# Patient Record
Sex: Female | Born: 1990 | Race: Black or African American | Hispanic: No | Marital: Single | Smoking: Current some day smoker
Health system: Southern US, Community
[De-identification: ages and names within clinical notes are randomized; demographics above are authoritative.]

## PROBLEM LIST (undated history)

## (undated) DIAGNOSIS — B009 Herpesviral infection, unspecified: Secondary | ICD-10-CM

## (undated) DIAGNOSIS — K219 Gastro-esophageal reflux disease without esophagitis: Secondary | ICD-10-CM

## (undated) DIAGNOSIS — I10 Essential (primary) hypertension: Secondary | ICD-10-CM

## (undated) DIAGNOSIS — Z720 Tobacco use: Principal | ICD-10-CM

## (undated) DIAGNOSIS — F419 Anxiety disorder, unspecified: Secondary | ICD-10-CM

## (undated) DIAGNOSIS — F418 Other specified anxiety disorders: Secondary | ICD-10-CM

## (undated) DIAGNOSIS — E663 Overweight: Secondary | ICD-10-CM

## (undated) DIAGNOSIS — G4700 Insomnia, unspecified: Principal | ICD-10-CM

## (undated) HISTORY — DX: Essential (primary) hypertension: I10

## (undated) HISTORY — DX: Other specified anxiety disorders: F41.8

## (undated) HISTORY — DX: Gastro-esophageal reflux disease without esophagitis: K21.9

## (undated) HISTORY — DX: Anxiety disorder, unspecified: F41.9

## (undated) HISTORY — DX: Insomnia, unspecified: G47.00

## (undated) HISTORY — DX: Herpesviral infection, unspecified: B00.9

## (undated) HISTORY — DX: Overweight: E66.3

## (undated) HISTORY — PX: TONSILLECTOMY AND ADENOIDECTOMY: SHX28

## (undated) HISTORY — DX: Tobacco use: Z72.0

## (undated) HISTORY — PX: WISDOM TOOTH EXTRACTION: SHX21

---
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
## 2011-01-28 ENCOUNTER — Encounter: Payer: Self-pay | Admitting: Internal Medicine

## 2011-01-28 ENCOUNTER — Other Ambulatory Visit (INDEPENDENT_AMBULATORY_CARE_PROVIDER_SITE_OTHER): Payer: 59

## 2011-01-28 ENCOUNTER — Ambulatory Visit (INDEPENDENT_AMBULATORY_CARE_PROVIDER_SITE_OTHER): Payer: 59 | Admitting: Internal Medicine

## 2011-01-28 DIAGNOSIS — R1033 Periumbilical pain: Secondary | ICD-10-CM

## 2011-01-28 DIAGNOSIS — K219 Gastro-esophageal reflux disease without esophagitis: Secondary | ICD-10-CM

## 2011-01-28 LAB — COMPREHENSIVE METABOLIC PANEL
AST: 23 U/L (ref 0–37)
Albumin: 3.9 g/dL (ref 3.5–5.2)
Alkaline Phosphatase: 40 U/L (ref 39–117)
Chloride: 108 mEq/L (ref 96–112)
Glucose, Bld: 92 mg/dL (ref 70–99)
Potassium: 4 mEq/L (ref 3.5–5.1)
Sodium: 141 mEq/L (ref 135–145)
Total Protein: 7.3 g/dL (ref 6.0–8.3)

## 2011-01-28 LAB — CBC WITH DIFFERENTIAL/PLATELET
Eosinophils Absolute: 0.2 10*3/uL (ref 0.0–0.7)
Eosinophils Relative: 2.9 % (ref 0.0–5.0)
Lymphocytes Relative: 32.7 % (ref 12.0–46.0)
MCV: 88.3 fl (ref 78.0–100.0)
Monocytes Absolute: 0.3 10*3/uL (ref 0.1–1.0)
Neutrophils Relative %: 59.2 % (ref 43.0–77.0)
Platelets: 317 10*3/uL (ref 150.0–400.0)
WBC: 6.1 10*3/uL (ref 4.5–10.5)

## 2011-01-28 MED ORDER — OMEPRAZOLE 20 MG PO CPDR: 20.0000 mg | DELAYED_RELEASE_CAPSULE | ORAL | Status: DC

## 2011-01-28 MED ORDER — HYOSCYAMINE SULFATE 0.125 MG SL SUBL: 0.1250 mg | SUBLINGUAL_TABLET | Freq: Two times a day (BID) | SUBLINGUAL | Status: DC | PRN

## 2011-01-28 NOTE — Progress Notes (Signed)
Beverly Pearson March 30, 1991 MRN [id number]    History of Present Illness:  This is a 20 year old, African American female freshman at [employer], with a two-month history of periumbilical abdominal pain associated with nausea but no vomiting which has been occuring almost on a daily basis. She also has some epigastric discomfort with burning in her chest suggestive of reflux. She has not tried any medications. There has been intentional weight loss from 145 to 132 pounds. She denies diarrhea, or rectal bleeding. There is a positive family history of gallbladder disease in her mother who had a cholecystectomy at age 71. There is no family history of inflammatory bowel disease. She started college last fall in [place] but was unhappy and transferred to [employer]. She did not do well academically in the first semester but has done well in summer school which finished last week. She does not sleep well. Her appetite has been decreased. She lives in her own apartment close to the campus. She drinks protein drinks and eats mostly at home. Her bowel habits have changed to constipation.   Past Medical History  Diagnosis Date  . GERD (gastroesophageal reflux disease)   . Constipation    Past Surgical History  Procedure Date  . Tonsillectomy     reports that she has been smoking.  She has never used smokeless tobacco. She reports that she does not drink alcohol or use illicit drugs. family history includes Diabetes in her maternal grandmother and mother.  There is no history of Colon cancer. No Known Allergies      Review of Systems: Denies dysphagia or odynophagia. Occasional heartburn. Positive for change in bowel habits. Negative for rectal bleeding positive for weight loss  The remainder of the 10  point ROS is negative except as outlined in H&P   Physical Exam: General appearance  Well developed, in no distress. Eyes- non icteric. HEENT nontraumatic, normocephalic. Mouth no lesions, tongue  papillated, no cheilosis. Neck supple without adenopathy, thyroid not enlarged, no carotid bruits, no JVD. Lungs Clear to auscultation bilaterally. Cor normal S1 normal S2, regular rhythm , no murmur,  quiet precordium. Abdomen soft nontender abdomen with normal active bowel sounds. No distention. No palpable mass. No CVA tenderness. Rectal: Soft Hemoccult negative stool. Extremities no pedal edema. Skin no lesions. Neurological alert and oriented x 3. Psychological normal mood and affect.  Assessment and Plan:  Problem #1 Periumbilical abdominal pain associated with nausea and gastroesophageal reflux. My strongest suspicion is that this is a functional problem associated with her adjustment. We need to rule out gallbladder disease because of her family history. We also need to rule out hyperacidity and dyspepsia. We will start her on Prilosec 20 mg daily and Levsin sublingual, 0.125 mg twice a day. We will obtain baseline chemistries and recheck her in about 4-6 weeks. If symptoms continue we will consider an upper endoscopy.   01/28/2011 Beverly Pearson

## 2011-01-28 NOTE — Patient Instructions (Signed)
You have been scheduled for an abdominal ultrasound at [facility] (1st floor of hospital) on Wednesday  02/04/11 at 9:00 am. Please arrive 15 minutes prior to your appointment for registration. Make certain not to have anything to eat or drink 6 hours prior to your appointment. Should you need to reschedule your appointment, please contact radiology at [phone]. We have sent the following medications to your pharmacy for you to pick up at your convenience: Prilosec 20 mg. Take 1 tablet by mouth once every morning. Levsin SL. Dissolve 1 tablet under the tongue twice daily as needed. We have sent the following medications to your pharmacy for you to pick up at your convenience: CBC, CMET, TtG, TSH, Sedimentation Rate

## 2011-01-29 ENCOUNTER — Telehealth: Payer: Self-pay | Admitting: *Deleted

## 2011-01-29 NOTE — Telephone Encounter (Signed)
Patient given results as per Dr. Brodie 

## 2011-01-29 NOTE — Telephone Encounter (Signed)
Message copied by Daphine Deutscher on Thu Jan 29, 2011  9:02 AM ------      Message from: Hart Carwin      Created: Wed Jan 28, 2011  6:09 PM       Please call pt with normal blood tests.

## 2011-01-29 NOTE — Telephone Encounter (Signed)
Left a message for patient to call me. 

## 2011-02-02 ENCOUNTER — Telehealth: Payer: Self-pay | Admitting: *Deleted

## 2011-02-02 NOTE — Telephone Encounter (Signed)
Left a message for patient to call me. 

## 2011-02-02 NOTE — Telephone Encounter (Signed)
Message copied by Daphine Deutscher on Mon Feb 02, 2011  3:27 PM ------      Message from: Hart Carwin      Created: Mon Feb 02, 2011  1:35 PM       Please call pt with negative results of Sprue test

## 2011-02-02 NOTE — Telephone Encounter (Signed)
Patient given results

## 2011-02-04 ENCOUNTER — Ambulatory Visit (HOSPITAL_COMMUNITY)
Admission: RE | Admit: 2011-02-04 | Discharge: 2011-02-04 | Disposition: A | Discharge status: Home / Self Care | Payer: 59 | Source: Ambulatory Visit | Attending: Internal Medicine | Admitting: Internal Medicine

## 2011-02-04 DIAGNOSIS — R1033 Periumbilical pain: Secondary | ICD-10-CM

## 2011-02-04 DIAGNOSIS — K219 Gastro-esophageal reflux disease without esophagitis: Secondary | ICD-10-CM

## 2011-02-04 IMAGING — US US ABDOMEN COMPLETE
1 series · 14 of 25 positions shown · non-contrast
Comparison: No comparison studies available.

CLINICAL DATA: Abdominal pain

ABDOMEN ULTRASOUND
TECHNIQUE: Complete abdominal ultrasound examination was performed
including evaluation of the liver, gallbladder, bile ducts,
pancreas, kidneys, spleen, IVC, and abdominal aorta.

[Series 1: us abdomen complete · 0.24mm/px · 14 of 78 slices shown]
[im 1/78]
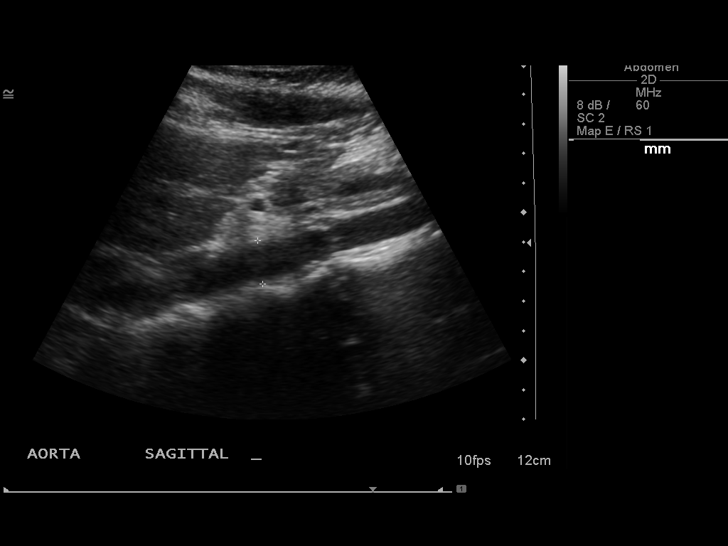
[im 7/78]
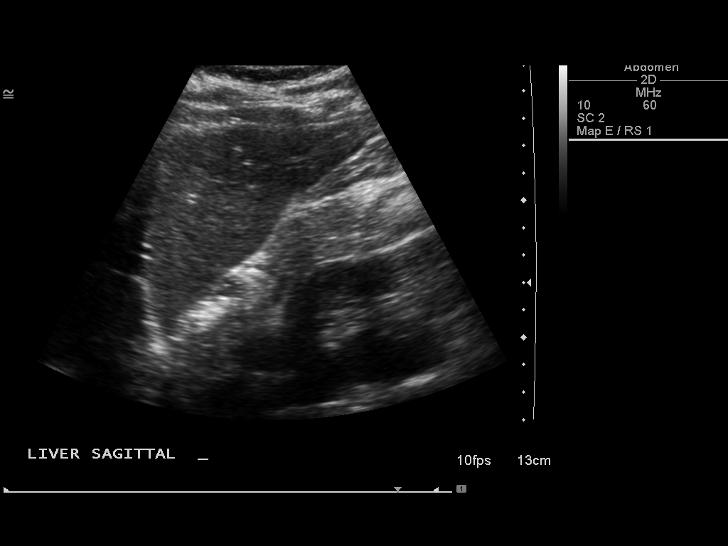
[im 13/78]
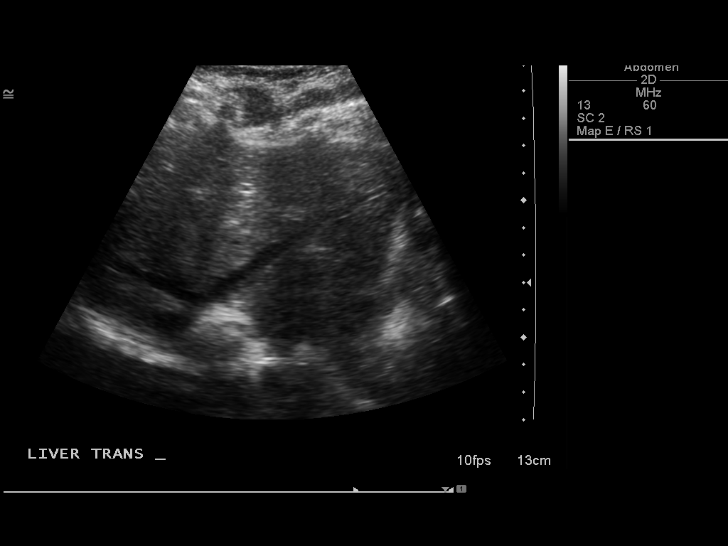
[im 20/78]
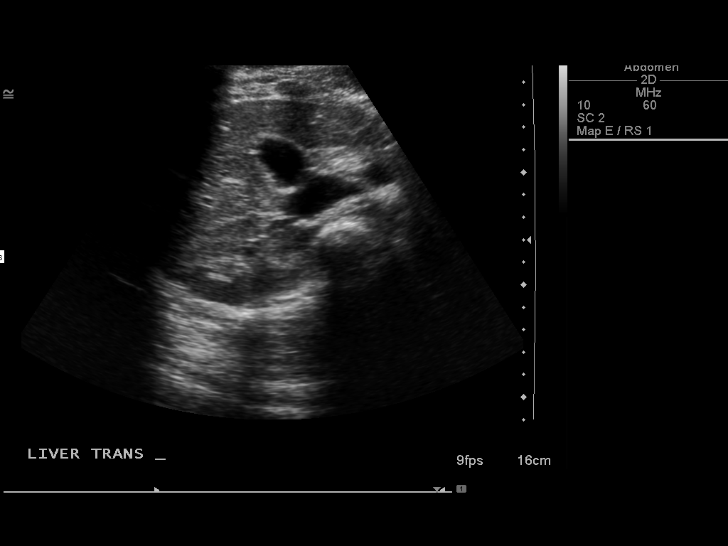
[im 26/78]
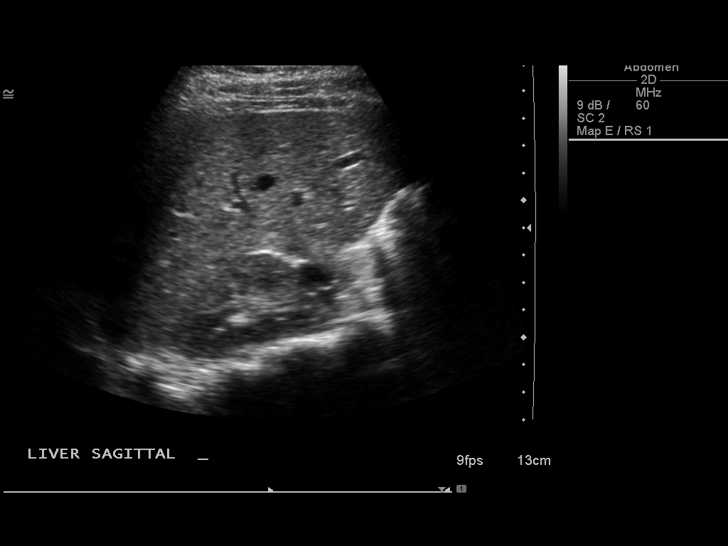
[im 29/78]
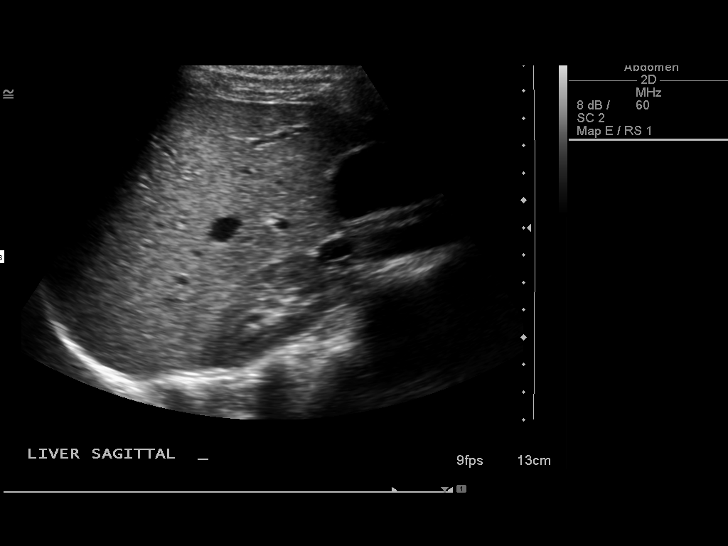
[im 36/78]
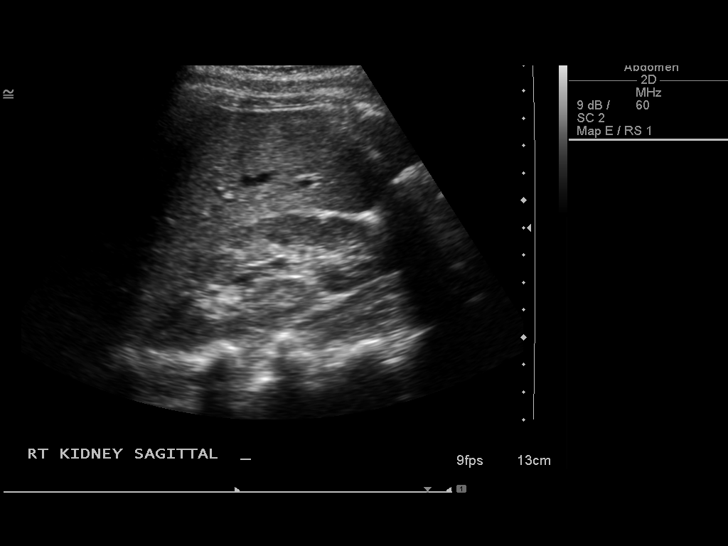
[im 42/78]
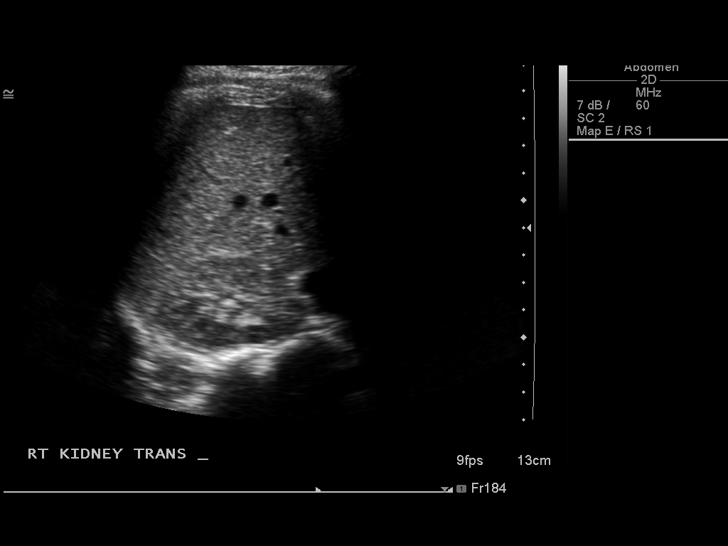
[im 49/78]
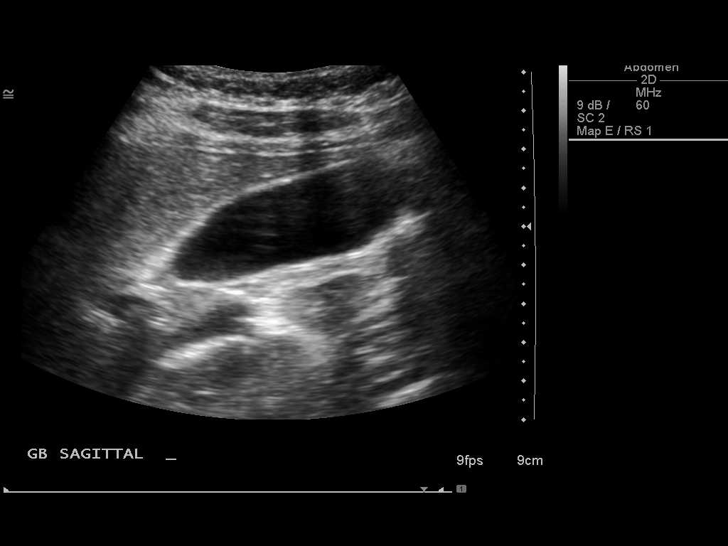
[im 52/78]
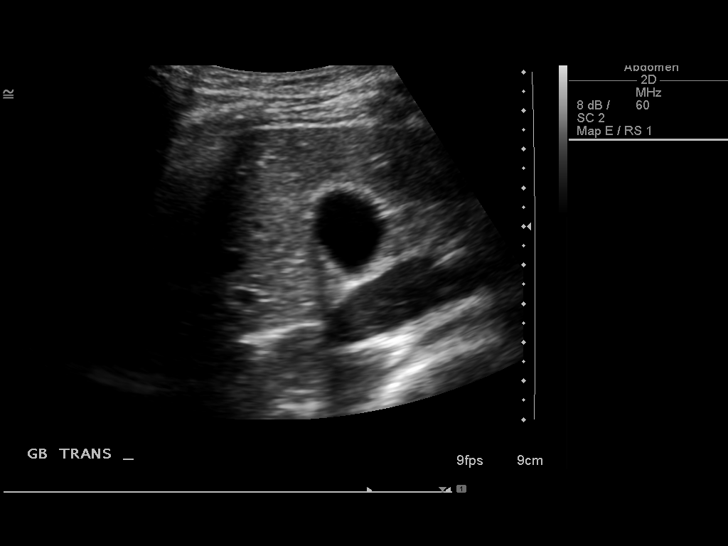
[im 58/78]
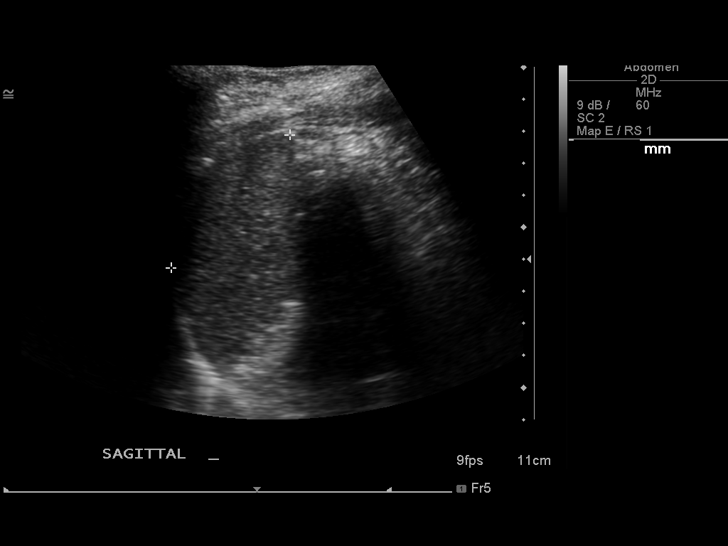
[im 65/78]
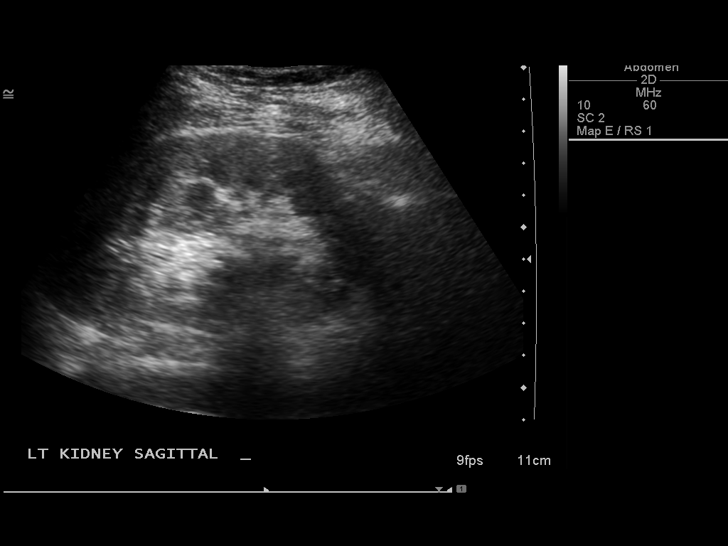
[im 71/78]
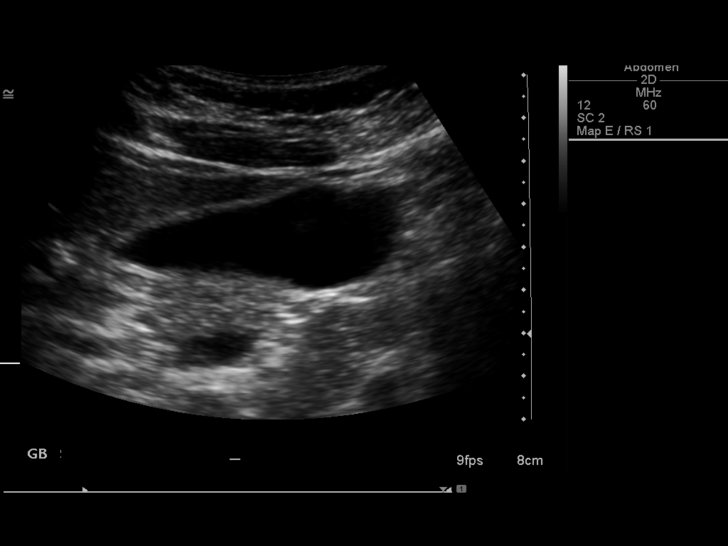
[im 78/78]
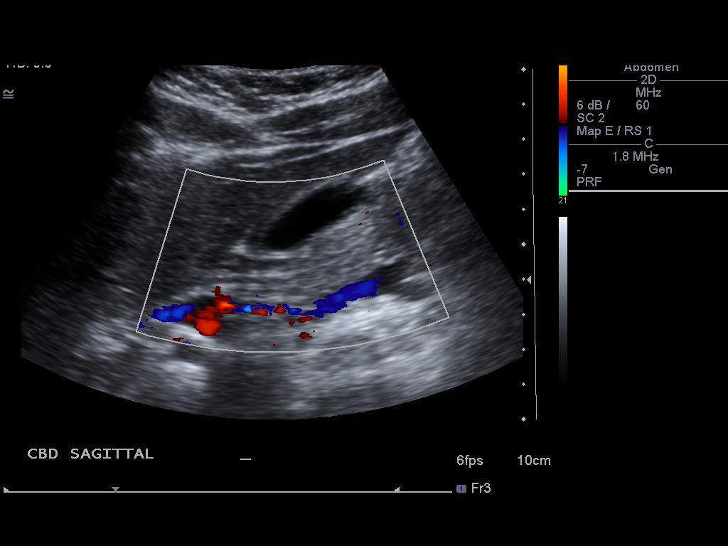

[14 of 25 positions shown; findings below may reference images not displayed]

FINDINGS: Gallbladder:  There is no evidence for gallstones.  No gallbladder
wall thickening or pericholecystic fluid.  The sonographer reports
no sonographic Murphy's sign.

Common Bile Duct:  Nondilated at 2 mm diameter.

Liver:  Normal.

IVC:  Normal.

Pancreas:  Normal.

Spleen:  Normal.

Right kidney:  10.5 cm in long axis.  Normal.

Left kidney:  11.0 cm in long axis.  Normal.

Abdominal Aorta:  No evidence for aneurysm.
IMPRESSION: Normal exam.

## 2011-02-06 ENCOUNTER — Telehealth: Payer: Self-pay | Admitting: *Deleted

## 2011-02-06 NOTE — Telephone Encounter (Signed)
Left a message for patient to call me. 

## 2011-02-06 NOTE — Telephone Encounter (Signed)
Message copied by Daphine Deutscher on Fri Feb 06, 2011  9:20 AM ------      Message from: Hart Carwin      Created: Thu Feb 05, 2011 10:37 PM       Please call pt with normal ultrasound of the gall bladder and liver

## 2011-02-06 NOTE — Telephone Encounter (Signed)
Patient given results as per Dr. Brodie 

## 2011-02-16 ENCOUNTER — Ambulatory Visit (INDEPENDENT_AMBULATORY_CARE_PROVIDER_SITE_OTHER): Payer: 59 | Admitting: Family Medicine

## 2011-02-16 ENCOUNTER — Encounter: Payer: Self-pay | Admitting: Family Medicine

## 2011-02-16 VITALS — BP 118/79 | HR 71 | Temp 98.2°F | Ht 66.0 in | Wt 130.0 lb

## 2011-02-16 DIAGNOSIS — R112 Nausea with vomiting, unspecified: Secondary | ICD-10-CM

## 2011-02-16 DIAGNOSIS — F411 Generalized anxiety disorder: Secondary | ICD-10-CM

## 2011-02-16 DIAGNOSIS — K5900 Constipation, unspecified: Secondary | ICD-10-CM

## 2011-02-16 DIAGNOSIS — F419 Anxiety disorder, unspecified: Secondary | ICD-10-CM

## 2011-02-16 DIAGNOSIS — R109 Unspecified abdominal pain: Secondary | ICD-10-CM

## 2011-02-16 DIAGNOSIS — K219 Gastro-esophageal reflux disease without esophagitis: Secondary | ICD-10-CM

## 2011-02-16 LAB — RENAL FUNCTION PANEL
BUN: 11 mg/dL (ref 6–23)
Chloride: 102 mEq/L (ref 96–112)
GFR: 99.58 mL/min (ref >60.00)
Phosphorus: 3.1 mg/dL (ref 2.3–4.6)
Potassium: 3.5 mEq/L (ref 3.5–5.1)
Sodium: 139 mEq/L (ref 135–145)

## 2011-02-16 LAB — CBC WITH DIFFERENTIAL/PLATELET
Basophils Absolute: 0 10*3/uL (ref 0.0–0.1)
HCT: 45.2 % (ref 36.0–46.0)
Hemoglobin: 15.2 g/dL — ABNORMAL HIGH (ref 12.0–15.0)
Lymphs Abs: 2.3 10*3/uL (ref 0.7–4.0)
Monocytes Relative: 3.6 % (ref 3.0–12.0)
Neutro Abs: 6.2 10*3/uL (ref 1.4–7.7)
RDW: 14.7 % — ABNORMAL HIGH (ref 11.5–14.6)

## 2011-02-16 LAB — POCT URINALYSIS DIPSTICK
Glucose, UA: NEGATIVE
Ketones, UA: >=160
Leukocytes, UA: NEGATIVE

## 2011-02-16 LAB — HEPATIC FUNCTION PANEL
Albumin: 4.6 g/dL (ref 3.5–5.2)
Alkaline Phosphatase: 42 U/L (ref 39–117)
Bilirubin, Direct: 0 mg/dL (ref 0.0–0.3)

## 2011-02-16 LAB — POCT URINE PREGNANCY: Preg Test, Ur: NEGATIVE

## 2011-02-16 MED ORDER — BENEFIBER PO POWD: ORAL | Status: DC

## 2011-02-16 MED ORDER — PROMETHAZINE HCL 25 MG/ML IJ SOLN
12.5000 mg | Freq: Once | INTRAMUSCULAR | Status: AC
  Administered 2011-02-16: 12.5 mg via INTRAMUSCULAR

## 2011-02-16 MED ORDER — ALPRAZOLAM 0.25 MG PO TABS: ORAL_TABLET | ORAL | Status: DC

## 2011-02-16 MED ORDER — ALIGN PO CAPS: 1.0000 | ORAL_CAPSULE | Freq: Every day | ORAL | Status: DC

## 2011-02-16 MED ORDER — PROMETHAZINE HCL 12.5 MG PO TABS: 12.5000 mg | ORAL_TABLET | Freq: Two times a day (BID) | ORAL | Status: AC | PRN

## 2011-02-16 MED ORDER — RANITIDINE HCL 300 MG PO TABS: 300.0000 mg | ORAL_TABLET | Freq: Every day | ORAL | Status: DC | PRN

## 2011-02-16 NOTE — Patient Instructions (Signed)
Constipation in Adults Constipation is having fewer than 2 bowel movements per week. Usually, the stools are hard. As we grow older, constipation is more common. If you try to fix constipation with laxatives, the problem may get worse. This is because laxatives taken over a Fussell period of time make the colon muscles weaker. A low-fiber diet, not taking in enough fluids, and taking some medicines may make these problems worse. MEDICATIONS THAT MAY CAUSE CONSTIPATION  Water pills (diuretics).  Calcium channel blockers (used to control blood pressure and for the heart).   Certain pain medicines (narcotics).   Anticholinergics.  Anti-inflammatory agents.   Antacids that contain aluminum.   DISEASES THAT CONTRIBUTE TO CONSTIPATION  Diabetes.  Parkinson's disease.   Dementia.   Stroke.  Depression.   Illnesses that cause problems with salt and water metabolism.   HOME CARE INSTRUCTIONS  Constipation is usually best cared for without medicines. Increasing dietary fiber and eating more fruits and vegetables is the best way to manage constipation.   Slowly increase fiber intake to 25 to 38 grams per day. Whole grains, fruits, vegetables, and legumes are good sources of fiber. A dietitian can further help you incorporate high-fiber foods into your diet.   Drink enough water and fluids to keep your urine clear or pale yellow.   A fiber supplement may be added to your diet if you cannot get enough fiber from foods.   Increasing your activities also helps improve regularity.   Suppositories, as suggested by your caregiver, will also help. If you are using antacids, such as aluminum or calcium containing products, it will be helpful to switch to products containing magnesium if your caregiver says it is okay.   If you have been given a liquid injection (enema) today, this is only a temporary measure. It should not be relied on for treatment of longstanding (chronic) constipation.    Stronger measures, such as magnesium sulfate, should be avoided if possible. This may cause uncontrollable diarrhea. Using magnesium sulfate may not allow you time to make it to the bathroom.  SEEK IMMEDIATE MEDICAL CARE IF:  There is bright red blood in the stool.   The constipation stays for more than 4 days.   There is belly (abdominal) or rectal pain.   You do not seem to be getting better.   You have any questions or concerns.  MAKE SURE YOU:  Understand these instructions.   Will watch your condition.   Will get help right away if you are not doing well or get worse.  Document Released: 03/06/2004 Document Re-Released: 09/02/2009 [facility] Patient Information 2011 [place], [place].    Start a probiotic such as Librarian, academic, or a generic alternative is acceptable  Remember 64 oz of clear fluids daily Try Mylanta as needed for heartburn Maintain The BRAT diet Bananas, rice, applesauce, toast, etc  Consider a yogurt daily, ie [employer]

## 2011-02-17 ENCOUNTER — Encounter: Payer: Self-pay | Admitting: Family Medicine

## 2011-02-17 ENCOUNTER — Telehealth: Payer: Self-pay | Admitting: Family Medicine

## 2011-02-17 DIAGNOSIS — K5900 Constipation, unspecified: Secondary | ICD-10-CM | POA: Insufficient documentation

## 2011-02-17 DIAGNOSIS — IMO0001 Reserved for inherently not codable concepts without codable children: Secondary | ICD-10-CM

## 2011-02-17 DIAGNOSIS — F419 Anxiety disorder, unspecified: Secondary | ICD-10-CM

## 2011-02-17 HISTORY — DX: Reserved for inherently not codable concepts without codable children: IMO0001

## 2011-02-17 HISTORY — DX: Anxiety disorder, unspecified: F41.9

## 2011-02-17 LAB — H. PYLORI ANTIBODY, IGG: H Pylori IgG: NEGATIVE

## 2011-02-17 NOTE — Assessment & Plan Note (Signed)
Patient was seen by gastroenterology earlier this month with persistent dyspepsia, nausea and vomiting. He was started on omeprazole and hyoscyamine. Has not found any relief from hyoscyamine.. Omeprazole has been marginally helpful but she has not taken the last day or 2 in preparation for her visit. She agrees to eat a bland diet and start Ranitdine. H Pylori is negative and her recent abdominal US was reviewed with her and was unremarkable. May reserve Hyoscyamine for sharp abdominal pains which generally not her concern.

## 2011-02-17 NOTE — Telephone Encounter (Signed)
Sent the results already all labs essentially normal see previous note

## 2011-02-17 NOTE — Assessment & Plan Note (Signed)
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 Allergies she is having trouble sleeping very anxious and having palpitations, diaphoresis and her stomach is worse when stress elevates. She is accompanied by her mother who agrees that she is very anxious. She is given a small amount of Alprazolam to use prn if she has a severely anxious moment to assess her response. She is asked to consider an SSRI in the future if symptoms persist.

## 2011-02-17 NOTE — Progress Notes (Signed)
Beverly Pearson [id number] 1990-12-27 02/17/2011      Progress Note New Patient  Subjective  Chief Complaint  Chief Complaint  Patient presents with  . Establish Care    New patient/ stomach issues    HPI  Patient is a 20 year old African [employer] sophomore who is in today for initial new patient appointment accompanied by her mother. She is struggling with for a couple month history of persistent reflux heartburn, dyspepsia. She has persistent nausea for about the last 2-3 months and experiences vomiting intermittently after eating. She denies any particular foods cause this. She denies any fevers, chills any bloody or tarry stool. She acknowledges trouble with constipation. She has 1 BM every 2-3 days and often has to strain in the bowel movements are hard and soft. This will alternate with more liquidy explosive stool. She has been attempting weight loss over the summer and had been taking Herbalife products which she has recently stopped. Today she started the Neurontin medication for anxiety but has not seen a difference yet. She did switch him to bring him seizing and the generic Seasonique sometime around the beginning of this. She does note some mild vaginal discharge but no lesions or sores. No fevers, chills, dysuria, hematuria, urinary frequency. No congestion no recent illness, fevers no chills. No palpitations, SOB. She acknowledges she is under a great deal of stress lately. Is working hard in school and is having anxiety attacks with palpitations, diaphoresis, tremulousness and a sense of SOB.   Past Medical History  Diagnosis Date  . GERD (gastroesophageal reflux disease)   . Constipation   . Reflux 02/17/2011  . Anxiety 02/17/2011    Past Surgical History  Procedure Date  . Tonsillectomy and adenoidectomy     age 23    Family History  Problem Relation Age of Onset  . Colon cancer Neg Hx   . Diabetes Mother     type 2  . Hypertension Mother   . Diabetes Maternal  Grandmother     type 2  . Cancer Maternal Grandmother 70    ovarian  . Hypertension Maternal Grandmother   . Kidney failure Maternal Grandfather   . Hypertension Maternal Grandfather   . Kidney disease Maternal Grandfather   . Diabetes Paternal Grandmother     type 2  . Hypertension Paternal Grandmother   . Emphysema Paternal Grandfather     smoke  . COPD Paternal Grandfather   . Cancer Paternal Grandfather 42    prostate cancer    History   Social History  . Marital Status: Single    Spouse Name: N/A    Number of Children: 0  . Years of Education: N/A   Occupational History  . Unemployed    Social History Main Topics  . Smoking status: Current Everyday Smoker    Types: Cigarettes  . Smokeless tobacco: Never Used   Comment: smokes 1 or 2 black and milds daily  . Alcohol Use: No  . Drug Use: No  . Sexually Active: No   Other Topics Concern  . Not on file   Social History Narrative   Occasional soda     Current Outpatient Prescriptions on File Prior to Visit  Medication Sig Dispense Refill  . Levonorgestrel-Ethinyl Estradiol (SEASONIQUE) 0.15-0.03 &0.01 MG tablet Take 1 tablet by mouth daily.        Marland Kitchen omeprazole (PRILOSEC) 20 MG capsule Take 1 capsule (20 mg total) by mouth every morning.  30 capsule  3  . Docusate  Calcium (STOOL SOFTENER PO) Take by mouth as needed.          No Known Allergies  Review of Systems Review of Systems  Constitutional: Positive for weight loss. Negative for fever, chills and malaise/fatigue.       Intentional, denies any bulimic or anorexic behavior, Mother agrees.  HENT: Negative for congestion and sore throat.   Eyes: Negative for discharge.  Respiratory: Negative for shortness of breath and wheezing.   Cardiovascular: Negative for chest pain, palpitations and leg swelling.  Gastrointestinal: Positive for heartburn, nausea, vomiting, diarrhea and constipation. Negative for abdominal pain, blood in stool and melena.    Genitourinary: Negative for dysuria.  Musculoskeletal: Negative for falls.  Skin: Negative for rash.  Neurological: Negative for loss of consciousness and headaches.  Endo/Heme/Allergies: Negative for polydipsia.  Psychiatric/Behavioral: Negative for depression, suicidal ideas and substance abuse. The patient is nervous/anxious. The patient does not have insomnia.     Objective  BP 118/79  Pulse 71  Temp(Src) 98.2 F (36.8 C) (Oral)  Ht 5\' 6"  (1.676 m)  Wt 130 lb (58.968 kg)  BMI 20.98 kg/m2  SpO2 96%  Physical Exam  Physical Exam  Constitutional: She is oriented to person, place, and time and well-developed, well-nourished, and in no distress. No distress.  HENT:  Head: Normocephalic and atraumatic.  Right Ear: External ear normal.  Left Ear: External ear normal.  Nose: Nose normal.  Mouth/Throat: Oropharynx is clear and moist. No oropharyngeal exudate.  Eyes: Conjunctivae are normal. Pupils are equal, round, and reactive to light. Right eye exhibits no discharge. Left eye exhibits no discharge. No scleral icterus.  Neck: Normal range of motion. Neck supple. No thyromegaly present.  Cardiovascular: Normal rate, regular rhythm, normal heart sounds and intact distal pulses.   No murmur heard. Pulmonary/Chest: Effort normal and breath sounds normal. No respiratory distress. She has no wheezes. She has no rales.  Abdominal: Soft. Bowel sounds are normal. She exhibits no distension and no mass. There is no tenderness.  Musculoskeletal: Normal range of motion. She exhibits no edema and no tenderness.  Lymphadenopathy:    She has no cervical adenopathy.  Neurological: She is alert and oriented to person, place, and time. She has normal reflexes. No cranial nerve deficit. Coordination normal.  Skin: Skin is warm and dry. No rash noted. She is not diaphoretic.  Psychiatric: Mood, memory and affect normal.       Assessment & Plan  Reflux Patient was seen by gastroenterology  earlier this month with persistent dyspepsia, nausea and vomiting. He was started on omeprazole and hyoscyamine. Has not found any relief from hyoscyamine.. Omeprazole has been marginally helpful but she has not taken the last day or 2 in preparation for her visit. She agrees to eat a bland diet and start Ranitdine. H Pylori is negative and her recent abdominal US was reviewed with her and was unremarkable. May reserve Hyoscyamine for sharp abdominal pains which generally not her concern.   Constipation Contributing to her dyspepsia. She is having hot difficult and has been only every 2-3 days. She often has to strain and has a loose bowel movement. This will actually was loose and liquidy explosive diarrhea. She is encouraged to increase the soluble fiber in her diet increase her hydration increase her exercise and had a fiber supplement such as FiberCon Metamucil daily. Chills and needs to start a program and if this is helpful we will add MiraLax as well.  Anxiety Patient is a sophomore at  [employer] and is under a great deal of stress. Allergies she is having trouble sleeping very anxious and having palpitations, diaphoresis and her stomach is worse when stress elevates. She is accompanied by her mother who agrees that she is very anxious. She is given a small amount of Alprazolam to use prn if she has a severely anxious moment to assess her response. She is asked to consider an SSRI in the future if symptoms persist.     f

## 2011-02-17 NOTE — Telephone Encounter (Signed)
Patient is checking to see if test results are in

## 2011-02-17 NOTE — Assessment & Plan Note (Signed)
Contributing to her dyspepsia. She is having hot difficult and has been only every 2-3 days. She often has to strain and has a loose bowel movement. This will actually was loose and liquidy explosive diarrhea. She is encouraged to increase the soluble fiber in her diet increase her hydration increase her exercise and had a fiber supplement such as FiberCon Metamucil daily. Chills and needs to start a program and if this is helpful we will add MiraLax as well.

## 2011-02-18 NOTE — Telephone Encounter (Signed)
Pt notified in results note. 

## 2011-02-26 ENCOUNTER — Ambulatory Visit (INDEPENDENT_AMBULATORY_CARE_PROVIDER_SITE_OTHER): Payer: 59 | Admitting: Licensed Clinical Social Worker

## 2011-02-26 DIAGNOSIS — F331 Major depressive disorder, recurrent, moderate: Secondary | ICD-10-CM

## 2011-03-02 ENCOUNTER — Emergency Department (HOSPITAL_COMMUNITY)
Admission: EM | Admit: 2011-03-02 | Discharge: 2011-03-03 | Disposition: A | Discharge status: Home / Self Care | Payer: 59 | Attending: Emergency Medicine | Admitting: Emergency Medicine

## 2011-03-02 DIAGNOSIS — R000 Tachycardia, unspecified: Secondary | ICD-10-CM | POA: Insufficient documentation

## 2011-03-02 DIAGNOSIS — F329 Major depressive disorder, single episode, unspecified: Secondary | ICD-10-CM | POA: Insufficient documentation

## 2011-03-02 DIAGNOSIS — F3289 Other specified depressive episodes: Secondary | ICD-10-CM | POA: Insufficient documentation

## 2011-03-02 DIAGNOSIS — F1910 Other psychoactive substance abuse, uncomplicated: Secondary | ICD-10-CM | POA: Insufficient documentation

## 2011-03-02 LAB — DIFFERENTIAL
Eosinophils Absolute: 0.1 10*3/uL (ref 0.0–0.7)
Eosinophils Relative: 1 % (ref 0–5)
Lymphs Abs: 2.6 10*3/uL (ref 0.7–4.0)
Monocytes Absolute: 0.5 10*3/uL (ref 0.1–1.0)
Monocytes Relative: 4 % (ref 3–12)

## 2011-03-02 LAB — RAPID URINE DRUG SCREEN, HOSP PERFORMED
Amphetamines: NOT DETECTED
Cocaine: NOT DETECTED
Opiates: NOT DETECTED
Tetrahydrocannabinol: POSITIVE — AB

## 2011-03-02 LAB — CBC
MCH: 29 pg (ref 26.0–34.0)
MCHC: 33.7 g/dL (ref 30.0–36.0)
MCV: 86 fL (ref 78.0–100.0)
Platelets: 367 10*3/uL (ref 150–400)
RDW: 14.2 % (ref 11.5–15.5)
WBC: 11.4 10*3/uL — ABNORMAL HIGH (ref 4.0–10.5)

## 2011-03-02 LAB — COMPREHENSIVE METABOLIC PANEL
AST: 32 U/L (ref 0–37)
Albumin: 3.9 g/dL (ref 3.5–5.2)
Calcium: 9.4 mg/dL (ref 8.4–10.5)
Creatinine, Ser: 0.68 mg/dL (ref 0.50–1.10)
Total Protein: 7.7 g/dL (ref 6.0–8.3)

## 2011-03-02 LAB — ETHANOL: Alcohol, Ethyl (B): <11 mg/dL (ref 0–11)

## 2011-03-03 ENCOUNTER — Encounter: Payer: Self-pay | Admitting: Family Medicine

## 2011-03-03 ENCOUNTER — Ambulatory Visit (INDEPENDENT_AMBULATORY_CARE_PROVIDER_SITE_OTHER): Payer: 59 | Admitting: Family Medicine

## 2011-03-03 VITALS — BP 118/79 | HR 103 | Temp 98.3°F | Ht 66.0 in | Wt 129.0 lb

## 2011-03-03 DIAGNOSIS — F419 Anxiety disorder, unspecified: Secondary | ICD-10-CM

## 2011-03-03 DIAGNOSIS — K219 Gastro-esophageal reflux disease without esophagitis: Secondary | ICD-10-CM

## 2011-03-03 DIAGNOSIS — M791 Myalgia, unspecified site: Secondary | ICD-10-CM

## 2011-03-03 DIAGNOSIS — K5900 Constipation, unspecified: Secondary | ICD-10-CM

## 2011-03-03 DIAGNOSIS — F341 Dysthymic disorder: Secondary | ICD-10-CM

## 2011-03-03 DIAGNOSIS — F418 Other specified anxiety disorders: Secondary | ICD-10-CM

## 2011-03-03 HISTORY — DX: Other specified anxiety disorders: F41.8

## 2011-03-03 MED ORDER — ALPRAZOLAM 0.25 MG PO TABS: ORAL_TABLET | ORAL | Status: DC

## 2011-03-03 MED ORDER — CARISOPRODOL 350 MG PO TABS: 350.0000 mg | ORAL_TABLET | Freq: Every evening | ORAL | Status: DC | PRN

## 2011-03-03 NOTE — Patient Instructions (Signed)
Depression You have signs of depression. This is a common problem. It can occur at any age. It is often hard to recognize. People can suffer from depression and still have moments of enjoyment. Depression interferes with your basic ability to function in life. It upsets your relationships, sleep, eating, and work habits. CAUSES Depression is believed to be caused by an imbalance in brain chemicals. It may be triggered by an unpleasant event. Relationship crises, a death in the family, financial worries, retirement, or other stressors are normal causes of depression. Depression may also start for no known reason. Other factors that may play a part include medical illnesses, some medicines, genetics, and alcohol or drug abuse. SYMPTOMS  Feeling unhappy or worthless.   Rhines-lasting (chronic) tiredness or worn-out feeling.   Self-destructive thoughts and actions.   Not being able to sleep or sleeping too much.   Eating more than usual or not eating at all.   Headaches or feeling anxious.   Trouble concentrating or making decisions.   Unexplained physical problems and substance abuse.  TREATMENT Depression usually gets better with treatment. This can include:  Antidepressant medicines. It can take weeks before the proper dose is achieved and benefits are reached.   Talking with a therapist, clergyperson, counselor, or friend. These people can help you gain insight into your problem and regain control of your life.   Eating a good diet.   Getting regular physical exercise, such as walking for 30 minutes every day.   Not abusing alcohol or drugs.  Treating depression often takes 6 months or longer. This length of treatment is needed to keep symptoms from returning. Call your caregiver and arrange for follow-up care as suggested. SEEK IMMEDIATE MEDICAL CARE IF:  You start to have thoughts of hurting yourself or others.   Call your local emergency services (911 in [place]   Go to your  local medical emergency department.   Call the National Suicide Prevention Lifeline: 1-800-273-TALK (1-800-273-8255).  Document Released: 06/08/2005 Document Re-Released: 11/26/2009 [employer] Patient Information 2011 [employer]. 

## 2011-03-03 NOTE — Assessment & Plan Note (Addendum)
She was prescribed ranitidine her last visit but did not start it. She agrees to try starting this week to see if it helps with her upper quadrant abdominal discomfort and her early satiety. Hyoscyamine has been helping with her sharper abdominal pain and her Promethazine helps with nausea. She is encouraged to maintain a bland diet and eat small, frequent meals

## 2011-03-03 NOTE — Progress Notes (Signed)
Beverly Pearson [id number] [birth year] 03/03/2011      Progress Note-Follow Up  Subjective  Chief Complaint  Chief Complaint  Patient presents with  . Follow-up    3 week follow up    HPI  This is a 20 year old female in today accompanied by her mother. She is very tearful and acknowledges severe depression. Presented to [facility] yesterday feeling very depressed and while she did not have a suicidal plan she does say she's had fleeting thoughts of suicide. She reports she is not a risk for self at present and has appointment at 2:00 today with psychiatry. Her mom is with her and agrees. She continues to struggle with her mood. She is now admitting to smoking marijuana and has been trying to stop. Did not sleep last night without smoking marijuana and is worried about sleeping tonight. Used to Xanax a few times since her last visit and does report it helped her stop crying when she was panicked but otherwise has not been helpful. She did start fiber and probiotics and her constipation is better but she continues to have intermittent abdominal pain usually in the upper quadrants. She has persistent nausea and anorexia as well as early satiety. No fevers, chills, chest pain, palpitations at present she does acknowledge chest tightness and shortness of breath occurs and she is crying and feeling panicked but not at other times.  Past Medical History  Diagnosis Date  . GERD (gastroesophageal reflux disease)   . Constipation   . Reflux 02/17/2011  . Anxiety 02/17/2011  . Depression with anxiety 03/03/2011  . Reflux 03/03/2011    Past Surgical History  Procedure Date  . Tonsillectomy and adenoidectomy     age 14    Family History  Problem Relation Age of Onset  . Colon cancer Neg Hx   . Diabetes Mother     type 2  . Hypertension Mother   . Diabetes Maternal Grandmother     type 2  . Cancer Maternal Grandmother 70    ovarian  . Hypertension Maternal Grandmother   . Kidney failure  Maternal Grandfather   . Hypertension Maternal Grandfather   . Kidney disease Maternal Grandfather   . Diabetes Paternal Grandmother     type 2  . Hypertension Paternal Grandmother   . Emphysema Paternal Grandfather     smoke  . COPD Paternal Grandfather   . Cancer Paternal Grandfather 14    prostate cancer    History   Social History  . Marital Status: Single    Spouse Name: N/A    Number of Children: 0  . Years of Education: N/A   Occupational History  . Unemployed    Social History Main Topics  . Smoking status: Former Smoker    Types: Cigarettes    Quit date: 02/16/2011  . Smokeless tobacco: Never Used   Comment: smokes 1 or 2 black and milds daily  . Alcohol Use: No  . Drug Use: Yes     marijuana-2 G or more daily- stopped 03-02-11  . Sexually Active: No   Other Topics Concern  . Not on file   Social History Narrative   Occasional soda     Current Outpatient Prescriptions on File Prior to Visit  Medication Sig Dispense Refill  . bifidobacterium infantis (ALIGN) capsule Take 1 capsule by mouth daily.  30 capsule  1  . Docusate Calcium (STOOL SOFTENER PO) Take by mouth as needed.        . hyoscyamine (  ANASPAZ) 0.125 MG TBDP Place 0.125 mg under the tongue 2 (two) times daily as needed.        . Levonorgestrel-Ethinyl Estradiol (SEASONIQUE) 0.15-0.03 &0.01 MG tablet Take 1 tablet by mouth daily.        . Multiple Vitamin (MULTIVITAMIN) tablet Take 1 tablet by mouth daily as needed.        Marland Kitchen omeprazole (PRILOSEC) 20 MG capsule Take 1 capsule (20 mg total) by mouth every morning.  30 capsule  3  . ranitidine (ZANTAC) 300 MG tablet Take 1 tablet (300 mg total) by mouth daily as needed for heartburn.  30 tablet  2  . Wheat Dextrin (BENEFIBER) POWD 2 tsp by mouth twice daily in 6-8 oz of liquid  730 g  0    No Known Allergies  Review of Systems  Review of Systems  Constitutional: Negative for fever and malaise/fatigue.  HENT: Negative for congestion.   Eyes:  Negative for discharge.  Respiratory: Negative for cough and shortness of breath.   Cardiovascular: Negative for chest pain, palpitations and leg swelling.  Gastrointestinal: Positive for nausea and abdominal pain. Negative for diarrhea, constipation, blood in stool and melena.  Genitourinary: Negative for dysuria.  Musculoskeletal: Negative for falls.  Skin: Negative for rash.  Neurological: Negative for loss of consciousness and headaches.  Endo/Heme/Allergies: Negative for polydipsia.  Psychiatric/Behavioral: Positive for depression, suicidal ideas and substance abuse. Negative for hallucinations. The patient is nervous/anxious and has insomnia.     Objective  BP 118/79  Pulse 103  Temp(Src) 98.3 F (36.8 C) (Oral)  Ht 5\' 6"  (1.676 m)  Wt 129 lb (58.514 kg)  BMI 20.82 kg/m2  SpO2 100%  Physical Exam  Physical Exam  Constitutional: She is oriented to person, place, and time and well-developed, well-nourished, and in no distress. No distress.  HENT:  Head: Normocephalic and atraumatic.  Eyes: Conjunctivae are normal.  Neck: Neck supple. No thyromegaly present.  Cardiovascular: Normal rate, regular rhythm and normal heart sounds.   No murmur heard. Pulmonary/Chest: Effort normal and breath sounds normal. She has no wheezes.  Abdominal: She exhibits no distension and no mass.  Musculoskeletal: She exhibits no edema.  Lymphadenopathy:    She has no cervical adenopathy.  Neurological: She is alert and oriented to person, place, and time.  Skin: Skin is warm and dry. No rash noted. She is not diaphoretic.  Psychiatric: Memory, affect and judgment normal.    Lab Results  Component Value Date   TSH 1.70 01/28/2011   Lab Results  Component Value Date   WBC 11.4* 03/02/2011   HGB 14.5 03/02/2011   HCT 43.0 03/02/2011   MCV 86.0 03/02/2011   PLT 367 03/02/2011   Lab Results  Component Value Date   CREATININE 0.68 03/02/2011   BUN 6 03/02/2011   NA 136 03/02/2011   K 3.5  03/02/2011   CL 100 03/02/2011   CO2 24 03/02/2011   Lab Results  Component Value Date   ALT 70* 03/02/2011   AST 32 03/02/2011   ALKPHOS 59 03/02/2011   BILITOT 0.3 03/02/2011      Assessment & Plan  Depression with anxiety Patient is accompanied by her mother acknowledging the depression is increasingly alert her issues and anxiety. She has used her very small amount of Xanax (at her last visit when she was crying incessantly and it did help. She did see about behavioral health as recommended and was referred on to psychiatry unfortunately the psychiatry she was referred  to was not taking new patients. Yesterday she presented to [facility] and has been set up for her own appointment with psychiatry today. She also notices at this visit and marijuana use which she did not admit to at her last visit. She has chosen to stop smoking all marijuana and is worried about how she will call herself to sleep without it. She is allowed Xanax 0.25 mg 1-2 tabs each bedtime to see if that helps and we will await psychiatry's important before making any further changes. We will see her next week in followup or sooner as needed  Constipation Improved with fiber supplement and probiotics. She now moves her bowels daily without significant difficulty.  Reflux She was prescribed ranitidine her last visit but did not start it. She agrees to try starting this week to see if it helps with her upper quadrant abdominal discomfort and her early satiety. Hyoscyamine has been helping with her sharper abdominal pain and her Promethazine helps with nausea. She is encouraged to maintain a bland diet and eat small, frequent meals

## 2011-03-03 NOTE — Assessment & Plan Note (Signed)
Patient is accompanied by her mother acknowledging the depression is increasingly alert her issues and anxiety. She has used her very small amount of Xanax (at her last visit when she was crying incessantly and it did help. She did see about behavioral health as recommended and was referred on to psychiatry unfortunately the psychiatry she was referred to was not taking new patients. Yesterday she presented to [facility] and has been set up for her own appointment with psychiatry today. She also notices at this visit and marijuana use which she did not admit to at her last visit. She has chosen to stop smoking all marijuana and is worried about how she will call herself to sleep without it. She is allowed Xanax 0.25 mg 1-2 tabs each bedtime to see if that helps and we will await psychiatry's important before making any further changes. We will see her next week in followup or sooner as needed

## 2011-03-03 NOTE — Progress Notes (Deleted)
SANE Consult

## 2011-03-03 NOTE — Assessment & Plan Note (Signed)
Improved with fiber supplement and probiotics. She now moves her bowels daily without significant difficulty.

## 2011-03-04 ENCOUNTER — Telehealth: Payer: Self-pay | Admitting: *Deleted

## 2011-03-04 MED ORDER — SUCRALFATE 1 G PO TABS: ORAL_TABLET | ORAL | Status: DC

## 2011-03-04 NOTE — Telephone Encounter (Signed)
Please advise 

## 2011-03-04 NOTE — Telephone Encounter (Signed)
Mother calls stating patient is still having stomach pain.  She has taken pain med and nausea med today.  She tried to eat soup today, but is still having nausea and pain.  Pt mom states she took pain med and nausea med last night and slept well.  Pt took 2 advil last night in addition to pain and nausea meds.  Pt mom would like to know why pt is still having pain and nausea.  Wants to know if "stomach bacteria" test needs to be repeated.  Pt unable to go to class this evening if she is still in pain.

## 2011-03-04 NOTE — Telephone Encounter (Signed)
pts mother informed and RX sent to pharmacy

## 2011-03-04 NOTE — Telephone Encounter (Signed)
Add Carafate 1 gm po tid with meals and qhs and avoid advil and NSAIDs for now. Just use Tylenol sparingly for pain (no more than 300mg /24 hours). Carafate comes in large tabs or liquid she can have whichever she prefers. 3 month supply. If no improvement will repeat labs and send her back for more GI evaluation

## 2011-03-09 ENCOUNTER — Ambulatory Visit (INDEPENDENT_AMBULATORY_CARE_PROVIDER_SITE_OTHER): Payer: 59 | Admitting: Family Medicine

## 2011-03-09 ENCOUNTER — Ambulatory Visit: Payer: 59 | Admitting: Family Medicine

## 2011-03-09 ENCOUNTER — Encounter: Payer: Self-pay | Admitting: Family Medicine

## 2011-03-09 ENCOUNTER — Ambulatory Visit: Payer: 59 | Admitting: Internal Medicine

## 2011-03-09 VITALS — BP 133/83 | HR 82 | Temp 98.0°F | Ht 66.0 in | Wt 127.1 lb

## 2011-03-09 DIAGNOSIS — F329 Major depressive disorder, single episode, unspecified: Secondary | ICD-10-CM

## 2011-03-09 DIAGNOSIS — K219 Gastro-esophageal reflux disease without esophagitis: Secondary | ICD-10-CM

## 2011-03-09 DIAGNOSIS — F418 Other specified anxiety disorders: Secondary | ICD-10-CM

## 2011-03-09 DIAGNOSIS — F341 Dysthymic disorder: Secondary | ICD-10-CM

## 2011-03-09 MED ORDER — SERTRALINE HCL 50 MG PO TABS: ORAL_TABLET | ORAL | Status: DC
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
## 2011-03-09 NOTE — Patient Instructions (Addendum)
Depression You have signs of depression. This is a common problem. It can occur at any age. It is often hard to recognize. People can suffer from depression and still have moments of enjoyment. Depression interferes with your basic ability to function in life. It upsets your relationships, sleep, eating, and work habits. CAUSES Depression is believed to be caused by an imbalance in brain chemicals. It may be triggered by an unpleasant event. Relationship crises, a death in the family, financial worries, retirement, or other stressors are normal causes of depression. Depression may also start for no known reason. Other factors that may play a part include medical illnesses, some medicines, genetics, and alcohol or drug abuse. SYMPTOMS  Feeling unhappy or worthless.   Briel-lasting (chronic) tiredness or worn-out feeling.   Self-destructive thoughts and actions.   Not being able to sleep or sleeping too much.   Eating more than usual or not eating at all.   Headaches or feeling anxious.   Trouble concentrating or making decisions.   Unexplained physical problems and substance abuse.  TREATMENT Depression usually gets better with treatment. This can include:  Antidepressant medicines. It can take weeks before the proper dose is achieved and benefits are reached.   Talking with a therapist, clergyperson, counselor, or friend. These people can help you gain insight into your problem and regain control of your life.   Eating a good diet.   Getting regular physical exercise, such as walking for 30 minutes every day.   Not abusing alcohol or drugs.  Treating depression often takes 6 months or longer. This length of treatment is needed to keep symptoms from returning. Call your caregiver and arrange for follow-up care as suggested. SEEK IMMEDIATE MEDICAL CARE IF:  You start to have thoughts of hurting yourself or others.   Call your local emergency services (911 in [place]   Go to your  local medical emergency department.   Call the National Suicide Prevention Lifeline: 1-800-273-TALK (315)431-6240).  Document Released: 06/08/2005 Document Re-Released: 11/26/2009 [facility] Patient Information 2011 [place], [place].  Start a fish oil like MegaRed 1 cap daily, by Schiff  Eat chocolate and exercise
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
## 2011-03-09 NOTE — Assessment & Plan Note (Signed)
Improved greatly with lifestyle changes and with the addition of Carafate to coat her stomach for now. No medication changes

## 2011-03-11 ENCOUNTER — Ambulatory Visit (INDEPENDENT_AMBULATORY_CARE_PROVIDER_SITE_OTHER): Payer: 59 | Admitting: Licensed Clinical Social Worker

## 2011-03-11 DIAGNOSIS — F331 Major depressive disorder, recurrent, moderate: Secondary | ICD-10-CM

## 2011-03-17 ENCOUNTER — Ambulatory Visit (INDEPENDENT_AMBULATORY_CARE_PROVIDER_SITE_OTHER): Payer: 59 | Admitting: Licensed Clinical Social Worker

## 2011-03-17 DIAGNOSIS — F331 Major depressive disorder, recurrent, moderate: Secondary | ICD-10-CM

## 2011-03-18 ENCOUNTER — Encounter: Payer: Self-pay | Admitting: Family Medicine

## 2011-03-18 ENCOUNTER — Ambulatory Visit (INDEPENDENT_AMBULATORY_CARE_PROVIDER_SITE_OTHER): Payer: 59 | Admitting: Family Medicine

## 2011-03-18 DIAGNOSIS — F419 Anxiety disorder, unspecified: Secondary | ICD-10-CM

## 2011-03-18 DIAGNOSIS — F418 Other specified anxiety disorders: Secondary | ICD-10-CM

## 2011-03-18 DIAGNOSIS — K219 Gastro-esophageal reflux disease without esophagitis: Secondary | ICD-10-CM

## 2011-03-18 DIAGNOSIS — K5900 Constipation, unspecified: Secondary | ICD-10-CM

## 2011-03-18 DIAGNOSIS — R110 Nausea: Secondary | ICD-10-CM

## 2011-03-18 DIAGNOSIS — F411 Generalized anxiety disorder: Secondary | ICD-10-CM

## 2011-03-18 DIAGNOSIS — F341 Dysthymic disorder: Secondary | ICD-10-CM

## 2011-03-18 DIAGNOSIS — G4700 Insomnia, unspecified: Secondary | ICD-10-CM

## 2011-03-18 MED ORDER — LORAZEPAM 0.5 MG PO TABS: 0.5000 mg | ORAL_TABLET | Freq: Two times a day (BID) | ORAL | Status: DC | PRN

## 2011-03-18 MED ORDER — ONDANSETRON 4 MG PO TBDP: 4.0000 mg | ORAL_TABLET | Freq: Three times a day (TID) | ORAL | Status: AC | PRN

## 2011-03-18 MED ORDER — MELATONIN 5 MG PO CAPS: 1.0000 | ORAL_CAPSULE | Freq: Every evening | ORAL | Status: DC | PRN

## 2011-03-18 NOTE — Patient Instructions (Addendum)
Insomnia Insomnia is frequent trouble falling and/or staying asleep. Insomnia can be a Spanos term problem or a short term problem. Both are common. Insomnia can be a short term problem when the wakefulness is related to a certain stress or worry. Shanley term insomnia is often related to ongoing stress during waking hours and/or poor sleeping habits. Overtime, sleep deprivation itself can make the problem worse. Every little thing feels more severe because you are overtired and your ability to cope is decreased. SYMPTOMS  Not feeling rested in the morning.   Anxiety and restlessness at bedtime.   Difficulty falling and staying asleep.  CAUSES  Stress, anxiety, and depression.   Poor sleeping habits.   Distractions such as TV in the bedroom.   Naps close to bedtime.   Engaging in emotionally charged conversations before bed.   Technical reading before sleep.   Alcohol and other sedatives. They may make the problem worse. They can hurt normal sleep patterns and normal dream activity.   Stimulants such as caffeine for several hours prior to bedtime.   Pain syndromes and shortness of breath can cause insomnia.   Exercise late at night.   Changing time zones may cause sleeping problems (jet lag).  It is sometimes helpful to have someone observe your sleeping patterns. They should look for periods of not breathing during the night (sleep apnea). They should also look to see how Sime those periods last. If you live alone or observers are uncertain, you can also be observed at a sleep clinic where your sleep patterns will be professionally monitored. Sleep apnea requires a checkup and treatment. Give your caregivers your medical history. Give your caregivers observations your family has made about your sleep.  TREATMENT  Your caregiver may prescribe treatment for an underlying medical disorders. Your caregiver can give advice or help if you are using alcohol or other drugs for self-medication.  Treatment of underlying problems will usually eliminate insomnia problems.   Medications can be prescribed for short time use. They are generally not recommended for lengthy use.   Over-the-counter sleep medicines are not recommended for lengthy use. They can be habit forming.   You can promote easier sleeping by making lifestyle changes such as:   Using relaxation techniques that help with breathing and reduce muscle tension.   Exercising earlier in the day.   Changing your diet and the time of your last meal. No night time snacks.   Establish a regular time to go to bed.   Counseling can help with stressful problems and worry.   Soothing music and white noise may be helpful if there are background noises you cannot remove.   Stop tedious detailed work at least one hour before bedtime.  HOME CARE INSTRUCTIONS  Keep a diary. Inform your caregiver about your progress. This includes any medication side effects. See your caregiver regularly. Take note of:   Times when you are asleep.  Times when you are awake during the night.   The quality of your sleep.  How you feel the next day.   This information will help your caregiver care for you.  Get out of bed if you are still awake after 15 minutes. Read or do some quiet activity. Keep the lights down. Wait until you feel sleepy and go back to bed.   Keep regular sleeping and waking hours. Avoid naps.   Exercise regularly.   Avoid distractions at bedtime. Distractions include watching television or engaging in any intense or detailed  activity like attempting to balance the household checkbook.   Develop a bedtime ritual. Keep a familiar routine of bathing, brushing your teeth, climbing into bed at the same time each night, listening to soothing music. Routines increase the success of falling to sleep faster.   Use relaxation techniques. This can be using breathing and muscle tension release routines. It can also include  visualizing peaceful scenes. You can also help control troubling or intruding thoughts by keeping your mind occupied with boring or repetitive thoughts like the old concept of counting sheep. You can make it more creative like imagining planting one beautiful flower after another in your backyard garden.   During your day, work to eliminate stress. When this is not possible use some of the previous suggestions to help reduce the anxiety that accompanies stressful situations.  MAKE SURE YOU:   Understand these instructions.   Will watch your condition.   Will get help right away if you are not doing well or get worse.  Document Released: 06/05/2000 Document Re-Released: 05/21/2008 [facility] Patient Information 2011 [place], [place].  Move Sertraline as early as possible Start Melatonin at bedtime, if unable to sleep try Lorazepam as instructed Try Carnation instant breakfast or Kefir, liquid yogurt daily for upset stomach For nausea or heartburn at night try Mylanta and then add Ondansetron as needed

## 2011-03-19 ENCOUNTER — Encounter: Payer: Self-pay | Admitting: Family Medicine

## 2011-03-19 NOTE — Progress Notes (Signed)
Beverly Pearson [id number] Sep 19, 1990 03/19/2011      Progress Note-Follow Up  Subjective  Chief Complaint  Chief Complaint  Patient presents with  . Follow-up    1 week follow up    HPI  20 year old [place] American female who is here today unaccompanied. She is much more animated, making eye contact and smiling today. She notes that she is feeling much better. She started to sertraline 25 mg daily as directed and has had no trouble with that. No diarrhea or worsening nausea is noted. She is seen her counselor with the [facility] a couple times a week and doing well in this regard is happy with the sessions. And agrees to set up for psychiatry although she has not done so yet. She denies suicidal ideation. She says her school is in working with her in she is less worried about that at this time. Her stomach does still continue to bother her some. She is not taking her Dr. Purvis Sheffield for [facility] as directed but she is taking a probiotic most days. Phenergan has not been helpful secondary to excessive sedation if she keeps it down but also trouble with vomiting it up at times. She notes nausea sometimes makes it difficult for her to sleep and she gets substernal burning when she lies down and the heartburn kicks up at times. Overall her bowels are moving better. Moving them once every day or 2 but still having to strain and notes that they're small in size at times. No palpitations no shortness of breath no fevers, chills, GU complaints noted today.  Past Medical History  Diagnosis Date  . GERD (gastroesophageal reflux disease)   . Constipation   . Reflux 02/17/2011  . Anxiety 02/17/2011  . Depression with anxiety 03/03/2011  . Reflux 03/03/2011    Past Surgical History  Procedure Date  . Tonsillectomy and adenoidectomy     age 5    Family History  Problem Relation Age of Onset  . Colon cancer Neg Hx   . Diabetes Mother     type 2  . Hypertension Mother   . Diabetes Maternal  Grandmother     type 2  . Cancer Maternal Grandmother 70    ovarian  . Hypertension Maternal Grandmother   . Kidney failure Maternal Grandfather   . Hypertension Maternal Grandfather   . Kidney disease Maternal Grandfather   . Diabetes Paternal Grandmother     type 2  . Hypertension Paternal Grandmother   . Emphysema Paternal Grandfather     smoke  . COPD Paternal Grandfather   . Cancer Paternal Grandfather 67    prostate cancer    History   Social History  . Marital Status: Single    Spouse Name: N/A    Number of Children: 0  . Years of Education: N/A   Occupational History  . Unemployed    Social History Main Topics  . Smoking status: Former Smoker    Types: Cigarettes    Quit date: 02/16/2011  . Smokeless tobacco: Never Used   Comment: smokes 1 or 2 black and milds daily  . Alcohol Use: No  . Drug Use: Yes     marijuana-2 G or more daily- stopped 03-02-11  . Sexually Active: No   Other Topics Concern  . Not on file   Social History Narrative   Occasional soda     Current Outpatient Prescriptions on File Prior to Visit  Medication Sig Dispense Refill  . bifidobacterium infantis (ALIGN) capsule  Take 1 capsule by mouth daily.  30 capsule  1  . Levonorgestrel-Ethinyl Estradiol (SEASONIQUE) 0.15-0.03 &0.01 MG tablet Take 1 tablet by mouth daily.        . Multiple Vitamin (MULTIVITAMIN) tablet Take 1 tablet by mouth daily as needed.        Marland Kitchen omeprazole (PRILOSEC) 20 MG capsule Take 1 capsule (20 mg total) by mouth every morning.  30 capsule  3  . ranitidine (ZANTAC) 300 MG tablet Take 1 tablet (300 mg total) by mouth daily as needed for heartburn.  30 tablet  2  . sertraline (ZOLOFT) 50 MG tablet 1/2 tab po daily x 7 days, then increase to 1 tab po daily  30 tablet  1  . sucralfate (CARAFATE) 1 G tablet 1 gm po tid w/meals and qhs  120 tablet  3  . Docusate Calcium (STOOL SOFTENER PO) Take by mouth as needed.        . hyoscyamine (ANASPAZ) 0.125 MG TBDP Place  0.125 mg under the tongue 2 (two) times daily as needed.        . promethazine (PHENERGAN) 12.5 MG tablet Take 12.5 mg by mouth as needed.        . Wheat Dextrin (BENEFIBER) POWD 2 tsp by mouth twice daily in 6-8 oz of liquid  730 g  0    No Known Allergies  Review of Systems  Review of Systems  Constitutional: Positive for weight loss. Negative for fever and malaise/fatigue.  HENT: Negative for congestion.   Eyes: Negative for discharge.  Respiratory: Negative for shortness of breath.   Cardiovascular: Negative for chest pain, palpitations and leg swelling.  Gastrointestinal: Negative for nausea, abdominal pain and diarrhea.  Genitourinary: Negative for dysuria.  Musculoskeletal: Negative for falls.  Skin: Negative for rash.  Neurological: Negative for loss of consciousness and headaches.  Endo/Heme/Allergies: Negative for polydipsia.  Psychiatric/Behavioral: Positive for depression. Negative for suicidal ideas, hallucinations and substance abuse. The patient is nervous/anxious and has insomnia.     Objective  BP 126/85  Pulse 87  Temp(Src) 98.3 F (36.8 C) (Oral)  Ht 5\' 6"  (1.676 m)  Wt 125 lb 12.8 oz (57.063 kg)  BMI 20.30 kg/m2  SpO2 98%  Physical Exam  Physical Exam  Constitutional: She is oriented to person, place, and time and well-developed, well-nourished, and in no distress. No distress.  HENT:  Head: Normocephalic and atraumatic.  Eyes: Conjunctivae are normal.  Neck: Neck supple. No thyromegaly present.  Cardiovascular: Normal rate, regular rhythm and normal heart sounds.   No murmur heard. Pulmonary/Chest: Effort normal and breath sounds normal. She has no wheezes.  Abdominal: She exhibits no distension and no mass.  Musculoskeletal: She exhibits no edema.  Lymphadenopathy:    She has no cervical adenopathy.  Neurological: She is alert and oriented to person, place, and time.  Skin: Skin is warm and dry. No rash noted. She is not diaphoretic.    Psychiatric: Memory, affect and judgment normal.    Lab Results  Component Value Date   TSH 1.70 01/28/2011   Lab Results  Component Value Date   WBC 11.4* 03/02/2011   HGB 14.5 03/02/2011   HCT 43.0 03/02/2011   MCV 86.0 03/02/2011   PLT 367 03/02/2011   Lab Results  Component Value Date   CREATININE 0.68 03/02/2011   BUN 6 03/02/2011   NA 136 03/02/2011   K 3.5 03/02/2011   CL 100 03/02/2011   CO2 24 03/02/2011   Lab Results  Component Value Date   ALT 70* 03/02/2011   AST 32 03/02/2011   ALKPHOS 59 03/02/2011   BILITOT 0.3 03/02/2011     Assessment & Plan  Depression with anxiety Patient is much happier today, she is making eye contact and smiling. Has been seeing [facility] and has started the Sertraline at 25 mg daily. She has tolerated that well but has not gone up to 50 mg yet. Plans to do that tomorrow. No GI upset is related to that. She is continuing to struggle with insomnia. This has been somewhat worse as the sertraline was started. She is asked to move her sertraline only in the day she can tolerate and to try adding a dose of melatonin at bedtime. Good sleep hygiene and also discussed. Handout provided the patient. She is in the process of getting set up with a psychiatric office as well and is encouraged to proceed with this  Constipation Improved somewhat but still having to strain and has smaller movements. Has not been taking her fiber or Docusate considtently. She is encouraged to take Align, Benefiber and Docusate daily. Increase soluble fiber and fluids in the diet and add some exercise  Reflux Improved on current meds, Carafate is helping but she is often taking only 2-3 doses daily with good results. Continue to avoid offending foods. Continue prn meds but is finding Phenergan causes excessive sedation so we will switch to Ondansetron ODT prn, she also has had trouble with vomiting up the Phenergan

## 2011-03-19 NOTE — Assessment & Plan Note (Signed)
Patient is much happier today, she is making eye contact and smiling. Has been seeing [facility] and has started the Sertraline at 25 mg daily. She has tolerated that well but has not gone up to 50 mg yet. Plans to do that tomorrow. No GI upset is related to that. She is continuing to struggle with insomnia. This has been somewhat worse as the sertraline was started. She is asked to move her sertraline only in the day she can tolerate and to try adding a dose of melatonin at bedtime. Good sleep hygiene and also discussed. Handout provided the patient. She is in the process of getting set up with a psychiatric office as well and is encouraged to proceed with this

## 2011-03-19 NOTE — Assessment & Plan Note (Signed)
Improved on current meds, Carafate is helping but she is often taking only 2-3 doses daily with good results. Continue to avoid offending foods. Continue prn meds but is finding Phenergan causes excessive sedation so we will switch to Ondansetron ODT prn, she also has had trouble with vomiting up the Phenergan

## 2011-03-19 NOTE — Progress Notes (Deleted)
  Subjective:    Patient ID: Beverly Pearson, female    DOB: [birth year], 20 y.o.   MRN: [id number]  HPI    Review of Systems     Objective:   Physical Exam        Assessment & Plan:

## 2011-03-19 NOTE — Assessment & Plan Note (Signed)
Improved somewhat but still having to strain and has smaller movements. Has not been taking her fiber or Docusate considtently. She is encouraged to take Align, Benefiber and Docusate daily. Increase soluble fiber and fluids in the diet and add some exercise

## 2011-04-01 ENCOUNTER — Ambulatory Visit (INDEPENDENT_AMBULATORY_CARE_PROVIDER_SITE_OTHER): Payer: 59 | Admitting: Licensed Clinical Social Worker

## 2011-04-01 ENCOUNTER — Ambulatory Visit (INDEPENDENT_AMBULATORY_CARE_PROVIDER_SITE_OTHER): Payer: 59 | Admitting: Family Medicine

## 2011-04-01 ENCOUNTER — Encounter: Payer: Self-pay | Admitting: Family Medicine

## 2011-04-01 VITALS — BP 118/80 | HR 85 | Temp 98.2°F | Ht 66.0 in | Wt 123.8 lb

## 2011-04-01 DIAGNOSIS — F419 Anxiety disorder, unspecified: Secondary | ICD-10-CM

## 2011-04-01 DIAGNOSIS — G4700 Insomnia, unspecified: Secondary | ICD-10-CM

## 2011-04-01 DIAGNOSIS — K5900 Constipation, unspecified: Secondary | ICD-10-CM

## 2011-04-01 DIAGNOSIS — F418 Other specified anxiety disorders: Secondary | ICD-10-CM

## 2011-04-01 DIAGNOSIS — Z23 Encounter for immunization: Secondary | ICD-10-CM

## 2011-04-01 DIAGNOSIS — F329 Major depressive disorder, single episode, unspecified: Secondary | ICD-10-CM

## 2011-04-01 DIAGNOSIS — F411 Generalized anxiety disorder: Secondary | ICD-10-CM

## 2011-04-01 DIAGNOSIS — K219 Gastro-esophageal reflux disease without esophagitis: Secondary | ICD-10-CM

## 2011-04-01 DIAGNOSIS — F331 Major depressive disorder, recurrent, moderate: Secondary | ICD-10-CM

## 2011-04-01 MED ORDER — SERTRALINE HCL 50 MG PO TABS: 50.0000 mg | ORAL_TABLET | Freq: Every day | ORAL | Status: DC

## 2011-04-01 MED ORDER — LORAZEPAM 0.5 MG PO TABS: 0.5000 mg | ORAL_TABLET | Freq: Two times a day (BID) | ORAL | Status: DC | PRN

## 2011-04-01 MED ORDER — SENNOSIDES 8.6 MG PO TABS: 1.0000 | ORAL_TABLET | Freq: Two times a day (BID) | ORAL | Status: DC

## 2011-04-01 NOTE — Patient Instructions (Signed)

## 2011-04-05 ENCOUNTER — Encounter: Payer: Self-pay | Admitting: Family Medicine

## 2011-04-05 NOTE — Progress Notes (Signed)
Beverly Pearson [id number] September 16, 1990 04/05/2011      Progress Note-Follow Up  Subjective  Chief Complaint  Chief Complaint  Patient presents with  . Follow-up    2 week follow up    HPI  Patient is a 20 year old [place] American female who is doing much better. She's been in with severe depression and anxiety but is doing much better on her sertraline. She is smiling in the office. She has managed to stay in classes and keep up with her counseling. Overall she is pleased with her progress and is become more active and able to concentrate. She denies suicidal ideation or any concerns regarding the medication. Overall her heartburn is much better as her bowels to move better. She does continue her Zantac with good effect. The addition of Carafate did allow the abdominal pain and burning to stop. Bowels are still do not move as easily as she would like but are moving better. She does have to strain at times. No chest pain, palpitations, febrile illness, shortness of breath, GI or GU complaints.  Past Medical History  Diagnosis Date  . GERD (gastroesophageal reflux disease)   . Constipation   . Reflux 02/17/2011  . Anxiety 02/17/2011  . Depression with anxiety 03/03/2011  . Reflux 03/03/2011    Past Surgical History  Procedure Date  . Tonsillectomy and adenoidectomy     age 23    Family History  Problem Relation Age of Onset  . Colon cancer Neg Hx   . Diabetes Mother     type 2  . Hypertension Mother   . Diabetes Maternal Grandmother     type 2  . Cancer Maternal Grandmother 70    ovarian  . Hypertension Maternal Grandmother   . Kidney failure Maternal Grandfather   . Hypertension Maternal Grandfather   . Kidney disease Maternal Grandfather   . Diabetes Paternal Grandmother     type 2  . Hypertension Paternal Grandmother   . Emphysema Paternal Grandfather     smoke  . COPD Paternal Grandfather   . Cancer Paternal Grandfather 67    prostate cancer    History   Social  History  . Marital Status: Single    Spouse Name: N/A    Number of Children: 0  . Years of Education: N/A   Occupational History  . Unemployed    Social History Main Topics  . Smoking status: Current Some Day Smoker    Types: Cigarettes  . Smokeless tobacco: Never Used   Comment: smokes 1 or 2 black and milds daily-  . Alcohol Use: No  . Drug Use: Yes     marijuana-2 G or more daily  . Sexually Active: No   Other Topics Concern  . Not on file   Social History Narrative   Occasional soda     Current Outpatient Prescriptions on File Prior to Visit  Medication Sig Dispense Refill  . bifidobacterium infantis (ALIGN) capsule Take 1 capsule by mouth daily.  30 capsule  1  . Docusate Calcium (STOOL SOFTENER PO) Take by mouth as needed.        . Levonorgestrel-Ethinyl Estradiol (SEASONIQUE) 0.15-0.03 &0.01 MG tablet Take 1 tablet by mouth daily.        . Multiple Vitamin (MULTIVITAMIN) tablet Take 1 tablet by mouth daily as needed.        . Wheat Dextrin (BENEFIBER) POWD 2 tsp by mouth twice daily in 6-8 oz of liquid  730 g  0  .  omeprazole (PRILOSEC) 20 MG capsule Take 1 capsule (20 mg total) by mouth every morning.  30 capsule  3    No Known Allergies  Review of Systems  Review of Systems  Constitutional: Negative for fever and malaise/fatigue.  HENT: Negative for congestion.   Eyes: Negative for discharge.  Respiratory: Negative for shortness of breath.   Cardiovascular: Negative for chest pain, palpitations and leg swelling.  Gastrointestinal: Negative for nausea, abdominal pain and diarrhea.  Genitourinary: Negative for dysuria.  Musculoskeletal: Negative for falls.  Skin: Negative for rash.  Neurological: Negative for loss of consciousness and headaches.  Endo/Heme/Allergies: Negative for polydipsia.  Psychiatric/Behavioral: Negative for depression and suicidal ideas. The patient is not nervous/anxious and does not have insomnia.     Objective  BP 118/80  Pulse  85  Temp(Src) 98.2 F (36.8 C) (Oral)  Ht 5\' 6"  (1.676 m)  Wt 123 lb 12.8 oz (56.155 kg)  BMI 19.98 kg/m2  SpO2 97%  Physical Exam  Physical Exam  Constitutional: She is oriented to person, place, and time and well-developed, well-nourished, and in no distress. No distress.  HENT:  Head: Normocephalic and atraumatic.  Eyes: Conjunctivae are normal.  Neck: Neck supple. No thyromegaly present.  Cardiovascular: Normal rate, regular rhythm and normal heart sounds.   No murmur heard. Pulmonary/Chest: Effort normal and breath sounds normal. She has no wheezes.  Abdominal: She exhibits no distension and no mass.  Musculoskeletal: She exhibits no edema.  Lymphadenopathy:    She has no cervical adenopathy.  Neurological: She is alert and oriented to person, place, and time.  Skin: Skin is warm and dry. No rash noted. She is not diaphoretic.  Psychiatric: Memory, affect and judgment normal.    Lab Results  Component Value Date   TSH 0.74 04/01/2011   Lab Results  Component Value Date   WBC 11.4* 03/02/2011   HGB 14.5 03/02/2011   HCT 43.0 03/02/2011   MCV 86.0 03/02/2011   PLT 367 03/02/2011   Lab Results  Component Value Date   CREATININE 0.68 03/02/2011   BUN 6 03/02/2011   NA 136 03/02/2011   K 3.5 03/02/2011   CL 100 03/02/2011   CO2 24 03/02/2011   Lab Results  Component Value Date   ALT 70* 03/02/2011   AST 32 03/02/2011   ALKPHOS 59 03/02/2011   BILITOT 0.3 03/02/2011      Assessment & Plan  Depression with anxiety Continues to do much much better with the addition of Zoloft. She is smiling and animated in the office. She continues with her counseling. She has managed to stay in school it is catching up with her class work on she feels well about her progress. No changes to medications and reevaluate in 1 months time or as needed.  Reflux Good response to current medications. Subsequently did seem to help calm down her symptoms and she does not need it as frequently she  once did. She is to avoid offending foods and to use her reflux meds routinely for another month and then as needed after that.  Constipation Continue fiber powder and probiotics increase fluids increase exercise increase soluble fiber in the diet. Overall this is greatly improved.

## 2011-04-05 NOTE — Assessment & Plan Note (Signed)
Continues to do much much better with the addition of Zoloft. She is smiling and animated in the office. She continues with her counseling. She has managed to stay in school it is catching up with her class work on she feels well about her progress. No changes to medications and reevaluate in 1 months time or as needed.

## 2011-04-05 NOTE — Assessment & Plan Note (Signed)
Continue fiber powder and probiotics increase fluids increase exercise increase soluble fiber in the diet. Overall this is greatly improved.

## 2011-04-05 NOTE — Assessment & Plan Note (Signed)
Good response to current medications. Subsequently did seem to help calm down her symptoms and she does not need it as frequently she once did. She is to avoid offending foods and to use her reflux meds routinely for another month and then as needed after that.

## 2011-04-15 ENCOUNTER — Telehealth: Payer: Self-pay | Admitting: Family Medicine

## 2011-04-15 NOTE — Telephone Encounter (Signed)
Patient needs a note from Dr Abner Greenspan excusing her from school due to being sick from her meds. Patient did not have specific dates.

## 2011-04-15 NOTE — Telephone Encounter (Signed)
Left a detailed message on pts voicemail to call back with specific dates so we can put a letter together for patient

## 2011-04-21 NOTE — Telephone Encounter (Signed)
Closing until patient returns call 

## 2011-04-22 ENCOUNTER — Ambulatory Visit: Payer: 59 | Admitting: Licensed Clinical Social Worker

## 2011-05-01 ENCOUNTER — Ambulatory Visit (INDEPENDENT_AMBULATORY_CARE_PROVIDER_SITE_OTHER): Payer: 59 | Admitting: Family Medicine

## 2011-05-01 ENCOUNTER — Encounter: Payer: Self-pay | Admitting: Family Medicine

## 2011-05-01 VITALS — BP 137/81 | HR 90 | Temp 98.1°F | Ht 66.0 in | Wt 125.1 lb

## 2011-05-01 DIAGNOSIS — K5900 Constipation, unspecified: Secondary | ICD-10-CM

## 2011-05-01 DIAGNOSIS — K219 Gastro-esophageal reflux disease without esophagitis: Secondary | ICD-10-CM

## 2011-05-01 DIAGNOSIS — F418 Other specified anxiety disorders: Secondary | ICD-10-CM

## 2011-05-01 DIAGNOSIS — F329 Major depressive disorder, single episode, unspecified: Secondary | ICD-10-CM

## 2011-05-01 DIAGNOSIS — F341 Dysthymic disorder: Secondary | ICD-10-CM

## 2011-05-01 MED ORDER — SERTRALINE HCL 100 MG PO TABS: 100.0000 mg | ORAL_TABLET | Freq: Every day | ORAL | Status: DC

## 2011-05-01 NOTE — Patient Instructions (Signed)
Depression You have signs of depression. This is a common problem. It can occur at any age. It is often hard to recognize. People can suffer from depression and still have moments of enjoyment. Depression interferes with your basic ability to function in life. It upsets your relationships, sleep, eating, and work habits. CAUSES  Depression is believed to be caused by an imbalance in brain chemicals. It may be triggered by an unpleasant event. Relationship crises, a death in the family, financial worries, retirement, or other stressors are normal causes of depression. Depression may also start for no known reason. Other factors that may play a part include medical illnesses, some medicines, genetics, and alcohol or drug abuse. SYMPTOMS   Feeling unhappy or worthless.   Karger-lasting (chronic) tiredness or worn-out feeling.   Self-destructive thoughts and actions.   Not being able to sleep or sleeping too much.   Eating more than usual or not eating at all.   Headaches or feeling anxious.   Trouble concentrating or making decisions.   Unexplained physical problems and substance abuse.  TREATMENT  Depression usually gets better with treatment. This can include:  Antidepressant medicines. It can take weeks before the proper dose is achieved and benefits are reached.   Talking with a therapist, clergyperson, counselor, or friend. These people can help you gain insight into your problem and regain control of your life.   Eating a good diet.   Getting regular physical exercise, such as walking for 30 minutes every day.   Not abusing alcohol or drugs.  Treating depression often takes 6 months or longer. This length of treatment is needed to keep symptoms from returning. Call your caregiver and arrange for follow-up care as suggested. SEEK IMMEDIATE MEDICAL CARE IF:   You start to have thoughts of hurting yourself or others.   Call your local emergency services (911 in [place]   Go to  your local medical emergency department.   Call the National Suicide Prevention Lifeline: 1-800-273-TALK (1-800-273-8255).  Document Released: 06/08/2005 Document Revised: 02/18/2011 Document Reviewed: 11/08/2009 [employer] Patient Information 2012 [employer]. 

## 2011-05-10 NOTE — Assessment & Plan Note (Signed)
  Resolved with dietary changes and controlling constipation. Has not needed to take her Omeprazole lately, avoid offending foods

## 2011-05-10 NOTE — Assessment & Plan Note (Signed)
Doing much better with increased fiber and fluid in the diet. No c/o today

## 2011-05-10 NOTE — Assessment & Plan Note (Signed)
Patient continues to go to class, take her meds and proceed with counseling, unfortunately she also continues to smoke [facility], She reports it helps her to feel better but admits it is only temporary and then she actually feels worse and more anxiouss. She agrees to try quitting altogether.

## 2011-05-10 NOTE — Progress Notes (Signed)
Beverly Pearson [id number] [birth year] 05/10/2011      Progress Note-Follow Up  Subjective  Chief Complaint  Chief Complaint  Patient presents with  . 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 Overall she continues to do better, is catching up on her school work and going to classes more. Unfortunately she coontinues to smoke and while she feels better initially she always more anxious afterwards trouble with sleep and concentration at times but denies any suicidal ideation. Her dyspepsia and constipation are much better, no belching or pain, only occasionally has to strain to have bm  Past Medical History  Diagnosis Date  . GERD (gastroesophageal reflux disease)   . Constipation   . Reflux 02/17/2011  . Anxiety 02/17/2011  . Depression with anxiety 03/03/2011  . Reflux 03/03/2011    Past Surgical History  Procedure Date  . Tonsillectomy and adenoidectomy     age 27    Family History  Problem Relation Age of Onset  . Colon cancer Neg Hx   . Diabetes Mother     type 2  . Hypertension Mother   . Diabetes Maternal Grandmother     type 2  . Cancer Maternal Grandmother 70    ovarian  . Hypertension Maternal Grandmother   . Kidney failure Maternal Grandfather   . Hypertension Maternal Grandfather   . Kidney disease Maternal Grandfather   . Diabetes Paternal Grandmother     type 2  . Hypertension Paternal Grandmother   . Emphysema Paternal Grandfather     smoke  . COPD Paternal Grandfather   . Cancer Paternal Grandfather 83    prostate cancer    History   Social History  . Marital Status: Single    Spouse Name: N/A    Number of Children: 0  . Years of Education: N/A   Occupational History  . Unemployed    Social History Main Topics  . Smoking status: Current Some Day Smoker    Types: Cigarettes  . Smokeless tobacco: Never Used   Comment: smokes 1 or 2 black and  milds daily-  . Alcohol Use: No  . Drug Use: Yes     marijuana-2 G or more daily  . Sexually Active: No   Other Topics Concern  . Not on file   Social History Narrative   Occasional soda     Current Outpatient Prescriptions on File Prior to Visit  Medication Sig Dispense Refill  . bifidobacterium infantis (ALIGN) capsule Take 1 capsule by mouth daily.  30 capsule  1  . Docusate Calcium (STOOL SOFTENER PO) Take by mouth as needed.        . Levonorgestrel-Ethinyl Estradiol (SEASONIQUE) 0.15-0.03 &0.01 MG tablet Take 1 tablet by mouth daily.        Marland Kitchen LORazepam (ATIVAN) 0.5 MG tablet Take 1 tablet (0.5 mg total) by mouth 2 (two) times daily as needed for anxiety (or insomnia).  30 tablet  1  . Multiple Vitamin (MULTIVITAMIN) tablet Take 1 tablet by mouth daily as needed.        . ondansetron (ZOFRAN) 4 MG tablet Take 4 mg by mouth as needed.        . senna (SENOKOT) 8.6 MG tablet Take 1 tablet by mouth 2 (two) times daily.  60 tablet  1  . Wheat Dextrin (BENEFIBER) POWD 2 tsp by mouth  twice daily in 6-8 oz of liquid  730 g  0  . omeprazole (PRILOSEC) 20 MG capsule Take 1 capsule (20 mg total) by mouth every morning.  30 capsule  3    No Known Allergies  Review of Systems  Review of Systems  Constitutional: Negative for fever and malaise/fatigue.  HENT: Negative for congestion.   Eyes: Negative for discharge.  Respiratory: Negative for shortness of breath.   Cardiovascular: Negative for chest pain, palpitations and leg swelling.  Gastrointestinal: Negative for nausea, abdominal pain and diarrhea.  Genitourinary: Negative for dysuria.  Musculoskeletal: Negative for falls.  Skin: Negative for rash.  Neurological: Negative for loss of consciousness and headaches.  Endo/Heme/Allergies: Negative for polydipsia.  Psychiatric/Behavioral: Positive for depression and substance abuse. Negative for suicidal ideas. The patient is nervous/anxious and has insomnia.        Using Ativan roughly  every other day to help her sleep is having trouble falling and staying asleep    Objective  BP 137/81  Pulse 90  Temp(Src) 98.1 F (36.7 C) (Oral)  Ht 5\' 6"  (1.676 m)  Wt 125 lb 1.9 oz (56.754 kg)  BMI 20.19 kg/m2  SpO2 98%  Physical Exam  Physical Exam  Constitutional: She is oriented to person, place, and time and well-developed, well-nourished, and in no distress. No distress.  HENT:  Head: Normocephalic and atraumatic.  Eyes: Conjunctivae are normal.  Neck: Neck supple. No thyromegaly present.  Cardiovascular: Normal rate, regular rhythm and normal heart sounds.   No murmur heard. Pulmonary/Chest: Effort normal and breath sounds normal. She has no wheezes.  Abdominal: She exhibits no distension and no mass.  Musculoskeletal: She exhibits no edema.  Lymphadenopathy:    She has no cervical adenopathy.  Neurological: She is alert and oriented to person, place, and time.  Skin: Skin is warm and dry. No rash noted. She is not diaphoretic.  Psychiatric: Memory, affect and judgment normal.    Lab Results  Component Value Date   TSH 0.74 04/01/2011   Lab Results  Component Value Date   WBC 11.4* 03/02/2011   HGB 14.5 03/02/2011   HCT 43.0 03/02/2011   MCV 86.0 03/02/2011   PLT 367 03/02/2011   Lab Results  Component Value Date   CREATININE 0.68 03/02/2011   BUN 6 03/02/2011   NA 136 03/02/2011   K 3.5 03/02/2011   CL 100 03/02/2011   CO2 24 03/02/2011   Lab Results  Component Value Date   ALT 70* 03/02/2011   AST 32 03/02/2011   ALKPHOS 59 03/02/2011   BILITOT 0.3 03/02/2011     Assessment & Plan  Depression with anxiety Patient continues to go to class, take her meds and proceed with counseling, unfortunately she also continues to smoke [facility], She reports it helps her to feel better but admits it is only temporary and then she actually feels worse and more anxiouss. She agrees to try quitting altogether.   Constipation Doing much better with increased fiber and  fluid in the diet. No c/o today  Reflux  Resolved with dietary changes and controlling constipation. Has not needed to take her Omeprazole lately, avoid offending foods

## 2011-05-29 ENCOUNTER — Ambulatory Visit: Payer: 59 | Admitting: Family Medicine

## 2011-05-29 DIAGNOSIS — Z0289 Encounter for other administrative examinations: Secondary | ICD-10-CM

## 2011-07-21 ENCOUNTER — Ambulatory Visit (INDEPENDENT_AMBULATORY_CARE_PROVIDER_SITE_OTHER): Payer: 59 | Admitting: Family Medicine

## 2011-07-21 ENCOUNTER — Encounter: Payer: Self-pay | Admitting: Family Medicine

## 2011-07-21 DIAGNOSIS — K5900 Constipation, unspecified: Secondary | ICD-10-CM

## 2011-07-21 DIAGNOSIS — G4700 Insomnia, unspecified: Secondary | ICD-10-CM

## 2011-07-21 DIAGNOSIS — F329 Major depressive disorder, single episode, unspecified: Secondary | ICD-10-CM

## 2011-07-21 DIAGNOSIS — K219 Gastro-esophageal reflux disease without esophagitis: Secondary | ICD-10-CM

## 2011-07-21 DIAGNOSIS — F341 Dysthymic disorder: Secondary | ICD-10-CM

## 2011-07-21 DIAGNOSIS — F411 Generalized anxiety disorder: Secondary | ICD-10-CM

## 2011-07-21 DIAGNOSIS — F419 Anxiety disorder, unspecified: Secondary | ICD-10-CM

## 2011-07-21 DIAGNOSIS — F418 Other specified anxiety disorders: Secondary | ICD-10-CM

## 2011-07-21 DIAGNOSIS — R112 Nausea with vomiting, unspecified: Secondary | ICD-10-CM

## 2011-07-21 MED ORDER — ONDANSETRON HCL 4 MG PO TABS: 4.0000 mg | ORAL_TABLET | ORAL | Status: DC | PRN

## 2011-07-21 MED ORDER — LORAZEPAM 0.5 MG PO TABS: 0.5000 mg | ORAL_TABLET | Freq: Two times a day (BID) | ORAL | Status: AC | PRN

## 2011-07-21 MED ORDER — SERTRALINE HCL 50 MG PO TABS: 50.0000 mg | ORAL_TABLET | Freq: Every day | ORAL | Status: DC

## 2011-07-21 MED ORDER — RANITIDINE HCL 300 MG PO TABS: 300.0000 mg | ORAL_TABLET | Freq: Every day | ORAL | Status: DC | PRN

## 2011-07-21 NOTE — Assessment & Plan Note (Signed)
Had a recent 24 hour bout with N/V and diarrhea that has all resolved but her reflux is flared. Restart the Ranitidine at 300 mg, avoid offending foods and restart probiotics

## 2011-07-21 NOTE — Patient Instructions (Signed)

## 2011-07-21 NOTE — Assessment & Plan Note (Signed)
This is greatly improved with dietary and lifestyle changes

## 2011-07-21 NOTE — Assessment & Plan Note (Signed)
Patient had been doing better on her sertraline so couple months ago she decided to stop it. Unfortunately over the last couple of weeks she's become more irritable and cries easier. We will start her on sertraline 50 mg daily and she will call she is a concern. She is given back some lorazepam to use very sparingly for anxiety and for sleep. She agrees to restart counseling as well

## 2011-07-21 NOTE — Progress Notes (Signed)
Patient ID: Beverly Pearson, female   DOB: 07/31/1990, 21 y.o.   MRN: [id number] Beverly Pearson [id number] 1991/02/27 07/21/2011      Progress Note-Follow Up  Subjective  Chief Complaint  Chief Complaint  Patient presents with  . Follow-up    HPI  Patient is a 21 year old [place] American female who is in today for followup. She is accompanied by her mother but her mother is not in the room as her. She continues to struggle with depression and unfortunately stopped her sertraline a couple months ago when she thought she was doing better. Then over the last 2 weeks she noticed her depression was worsening and she was becoming more gurgling crying easier. She denies any injury his behaviors or suicidal ideation. She did return back home with her mother when she developed an acute gastroenteritis. She had she's been staying there and feels better when she is there. In her apartment with a roommate she has been smoking a little marijuana realizes that makes her depression and anxiety worse and is willing to stop again. Her stomach it flared with nausea vomiting and diarrhea but that is now resolved. She is left with some heartburn and has stopped both for her blood medications as well. Her bowels are moving well. No headaches, fevers, chest pain or signs of acute illness. She continues to have some trouble falling asleep and staying asleep when she feels excessively anxious. She is back in school 12 credit hrs this semester and he noticed that his increased her stress level as well. She was seeking counseling regularly and is also backed off of that and realizes she must restart this as well.  Past Medical History  Diagnosis Date  . GERD (gastroesophageal reflux disease)   . Constipation   . Reflux 02/17/2011  . Anxiety 02/17/2011  . Depression with anxiety 03/03/2011  . Reflux 03/03/2011    Past Surgical History  Procedure Date  . Tonsillectomy and adenoidectomy     age 61    Family History    Problem Relation Age of Onset  . Colon cancer Neg Hx   . Diabetes Mother     type 2  . Hypertension Mother   . Diabetes Maternal Grandmother     type 2  . Cancer Maternal Grandmother 70    ovarian  . Hypertension Maternal Grandmother   . Kidney failure Maternal Grandfather   . Hypertension Maternal Grandfather   . Kidney disease Maternal Grandfather   . Diabetes Paternal Grandmother     type 2  . Hypertension Paternal Grandmother   . Emphysema Paternal Grandfather     smoke  . COPD Paternal Grandfather   . Cancer Paternal Grandfather 24    prostate cancer    History   Social History  . Marital Status: Single    Spouse Name: N/A    Number of Children: 0  . Years of Education: N/A   Occupational History  . Unemployed    Social History Main Topics  . Smoking status: Former Smoker    Types: Cigarettes    Quit date: 07/17/2011  . Smokeless tobacco: Never Used   Comment: smokes 1 or 2 black and milds daily-  . Alcohol Use: No  . Drug Use: No     marijuana-2 G or more daily  . Sexually Active: No   Other Topics Concern  . Not on file   Social History Narrative   Occasional soda     Current Outpatient Prescriptions on File  Prior to Visit  Medication Sig Dispense Refill  . Levonorgestrel-Ethinyl Estradiol (SEASONIQUE) 0.15-0.03 &0.01 MG tablet Take 1 tablet by mouth daily.        . Multiple Vitamin (MULTIVITAMIN) tablet Take 1 tablet by mouth daily as needed.        . senna (SENOKOT) 8.6 MG tablet Take 1 tablet by mouth 2 (two) times daily.  60 tablet  1    No Known Allergies  Review of Systems  Review of Systems  Constitutional: Negative for fever and malaise/fatigue.  HENT: Negative for congestion.   Eyes: Negative for discharge.  Respiratory: Negative for shortness of breath.   Cardiovascular: Negative for chest pain, palpitations and leg swelling.  Gastrointestinal: Negative for nausea, abdominal pain and diarrhea.  Genitourinary: Negative for  dysuria.  Musculoskeletal: Negative for falls.  Skin: Negative for rash.  Neurological: Negative for loss of consciousness and headaches.  Endo/Heme/Allergies: Negative for polydipsia.  Psychiatric/Behavioral: Positive for depression. Negative for suicidal ideas, hallucinations and substance abuse. The patient is nervous/anxious and has insomnia.        Has started using some THC again recently    Objective  BP 138/85  Pulse 87  Temp(Src) 99.4 F (37.4 C) (Temporal)  Ht 5\' 6"  (1.676 m)  Wt 128 lb 6.4 oz (58.242 kg)  BMI 20.72 kg/m2  SpO2 97%  LMP 06/26/2011  Physical Exam  Physical Exam  Constitutional: She is oriented to person, place, and time and well-developed, well-nourished, and in no distress. No distress.  HENT:  Head: Normocephalic and atraumatic.  Eyes: Conjunctivae are normal.  Neck: Neck supple. No thyromegaly present.  Cardiovascular: Normal rate, regular rhythm and normal heart sounds.   No murmur heard. Pulmonary/Chest: Effort normal and breath sounds normal. She has no wheezes.  Abdominal: She exhibits no distension and no mass.  Musculoskeletal: She exhibits no edema.  Lymphadenopathy:    She has no cervical adenopathy.  Neurological: She is alert and oriented to person, place, and time.  Skin: Skin is warm and dry. No rash noted. She is not diaphoretic.  Psychiatric: Memory, affect and judgment normal.    Lab Results  Component Value Date   TSH 0.74 04/01/2011   Lab Results  Component Value Date   WBC 11.4* 03/02/2011   HGB 14.5 03/02/2011   HCT 43.0 03/02/2011   MCV 86.0 03/02/2011   PLT 367 03/02/2011   Lab Results  Component Value Date   CREATININE 0.68 03/02/2011   BUN 6 03/02/2011   NA 136 03/02/2011   K 3.5 03/02/2011   CL 100 03/02/2011   CO2 24 03/02/2011   Lab Results  Component Value Date   ALT 70* 03/02/2011   AST 32 03/02/2011   ALKPHOS 59 03/02/2011   BILITOT 0.3 03/02/2011     Assessment & Plan  Depression with anxiety Patient  had been doing better on her sertraline so couple months ago she decided to stop it. Unfortunately over the last couple of weeks she's become more irritable and cries easier. We will start her on sertraline 50 mg daily and she will call she is a concern. She is given back some lorazepam to use very sparingly for anxiety and for sleep. She agrees to restart counseling as well  Constipation This is greatly improved with dietary and lifestyle changes  Reflux Had a recent 24 hour bout with N/V and diarrhea that has all resolved but her reflux is flared. Restart the Ranitidine at 300 mg, avoid offending foods and restart  probiotics

## 2011-08-20 ENCOUNTER — Ambulatory Visit: Payer: 59 | Admitting: Family Medicine

## 2011-09-01 ENCOUNTER — Ambulatory Visit (INDEPENDENT_AMBULATORY_CARE_PROVIDER_SITE_OTHER): Payer: 59 | Admitting: Family Medicine

## 2011-09-01 ENCOUNTER — Encounter: Payer: Self-pay | Admitting: Family Medicine

## 2011-09-01 DIAGNOSIS — F341 Dysthymic disorder: Secondary | ICD-10-CM

## 2011-09-01 DIAGNOSIS — G4700 Insomnia, unspecified: Secondary | ICD-10-CM

## 2011-09-01 DIAGNOSIS — K219 Gastro-esophageal reflux disease without esophagitis: Secondary | ICD-10-CM

## 2011-09-01 DIAGNOSIS — F418 Other specified anxiety disorders: Secondary | ICD-10-CM

## 2011-09-01 HISTORY — DX: Insomnia, unspecified: G47.00

## 2011-09-01 NOTE — Progress Notes (Signed)
Patient ID: Beverly Pearson, female   DOB: 1991/04/03, 21 y.o.   MRN: [id number] Beverly Pearson [id number] 09-Jul-1990 09/01/2011      Progress Note-Follow Up  Subjective  Chief Complaint  Chief Complaint  Patient presents with  . Follow-up    1 month follow up    HPI  Patient is a 21 year old demented female who is in today for reevaluation of anxiety and depression. She reports doing much better. Increase the medications helped. Tumor comparable in her home and is doing better at work. She feels less anxious but she continues to have trouble sleeping. She's tried multiple over-the-counter medications without success. Her bowels are moving better at the present time she denies any significant heartburn  Past Medical History  Diagnosis Date  . GERD (gastroesophageal reflux disease)   . Constipation   . Reflux 02/17/2011  . Anxiety 02/17/2011  . Depression with anxiety 03/03/2011  . Reflux 03/03/2011  . Insomnia 09/01/2011    Past Surgical History  Procedure Date  . Tonsillectomy and adenoidectomy     age 21    Family History  Problem Relation Age of Onset  . Colon cancer Neg Hx   . Diabetes Mother     type 2  . Hypertension Mother   . Diabetes Maternal Grandmother     type 2  . Cancer Maternal Grandmother 70    ovarian  . Hypertension Maternal Grandmother   . Kidney failure Maternal Grandfather   . Hypertension Maternal Grandfather   . Kidney disease Maternal Grandfather   . Diabetes Paternal Grandmother     type 2  . Hypertension Paternal Grandmother   . Emphysema Paternal Grandfather     smoke  . COPD Paternal Grandfather   . Cancer Paternal Grandfather 23    prostate cancer    History   Social History  . Marital Status: Single    Spouse Name: N/A    Number of Children: 0  . Years of Education: N/A   Occupational History  . Unemployed    Social History Main Topics  . Smoking status: Current Some Day Smoker    Types: Cigarettes    Last Attempt to Quit:  07/17/2011  . Smokeless tobacco: Never Used   Comment: smokes 1 or 2 black and milds daily-  . Alcohol Use: No  . Drug Use: No     marijuana-2 G or more daily  . Sexually Active: No   Other Topics Concern  . Not on file   Social History Narrative   Occasional soda     Current Outpatient Prescriptions on File Prior to Visit  Medication Sig Dispense Refill  . Levonorgestrel-Ethinyl Estradiol (SEASONIQUE) 0.15-0.03 &0.01 MG tablet Take 1 tablet by mouth daily.        Marland Kitchen LORazepam (ATIVAN) 0.5 MG tablet Take 1 tablet (0.5 mg total) by mouth 2 (two) times daily as needed for anxiety (or insomnia).  40 tablet  1  . Multiple Vitamin (MULTIVITAMIN) tablet Take 1 tablet by mouth daily as needed.        . ondansetron (ZOFRAN) 4 MG tablet Take 1 tablet (4 mg total) by mouth as needed.  20 tablet  1  . ranitidine (ZANTAC) 300 MG tablet Take 1 tablet (300 mg total) by mouth daily as needed for heartburn.  30 tablet  2  . sertraline (ZOLOFT) 50 MG tablet Take 1 tablet (50 mg total) by mouth daily.  30 tablet  3    No Known Allergies  Review of Systems  Review of Systems  Constitutional: Negative for fever and malaise/fatigue.  HENT: Negative for congestion.   Eyes: Negative for discharge.  Respiratory: Negative for shortness of breath.   Cardiovascular: Negative for chest pain, palpitations and leg swelling.  Gastrointestinal: Negative for nausea, abdominal pain and diarrhea.  Genitourinary: Negative for dysuria.  Musculoskeletal: Negative for falls.  Skin: Negative for rash.  Neurological: Negative for loss of consciousness and headaches.  Endo/Heme/Allergies: Negative for polydipsia.  Psychiatric/Behavioral: Negative for depression and suicidal ideas. The patient is nervous/anxious and has insomnia.     Objective  BP 125/84  Pulse 83  Temp(Src) 99.7 F (37.6 C) (Temporal)  Ht 5\' 6"  (1.676 m)  Wt 135 lb (61.236 kg)  BMI 21.79 kg/m2  SpO2 95%  Physical Exam  Physical Exam    Constitutional: She is oriented to person, place, and time and well-developed, well-nourished, and in no distress. No distress.  HENT:  Head: Normocephalic and atraumatic.  Eyes: Conjunctivae are normal.  Neck: Neck supple. No thyromegaly present.  Cardiovascular: Normal rate, regular rhythm and normal heart sounds.   No murmur heard. Pulmonary/Chest: Effort normal and breath sounds normal. She has no wheezes.  Abdominal: She exhibits no distension and no mass.  Musculoskeletal: She exhibits no edema.  Lymphadenopathy:    She has no cervical adenopathy.  Neurological: She is alert and oriented to person, place, and time.  Skin: Skin is warm and dry. No rash noted. She is not diaphoretic.  Psychiatric: Memory, affect and judgment normal.    Lab Results  Component Value Date   TSH 0.74 04/01/2011   Lab Results  Component Value Date   WBC 11.4* 03/02/2011   HGB 14.5 03/02/2011   HCT 43.0 03/02/2011   MCV 86.0 03/02/2011   PLT 367 03/02/2011   Lab Results  Component Value Date   CREATININE 0.68 03/02/2011   BUN 6 03/02/2011   NA 136 03/02/2011   K 3.5 03/02/2011   CL 100 03/02/2011   CO2 24 03/02/2011   Lab Results  Component Value Date   ALT 70* 03/02/2011   AST 32 03/02/2011   ALKPHOS 59 03/02/2011   BILITOT 0.3 03/02/2011      Assessment & Plan  Insomnia Has tried multiple otc meds without results, may use hre Lorazepam prn and is instructed on good sleep hygiene as well  Reflux Well controlled with current dietary changes and meds, no changes  Depression with anxiety She feels she is doing better with increase in meds, she feels more comfortable in her apt and is doing well at home as well. Will continue same dosing for nwo and reevaluate in 6 weeks or as needed

## 2011-09-01 NOTE — Assessment & Plan Note (Signed)
Has tried multiple otc meds without results, may use hre Lorazepam prn and is instructed on good sleep hygiene as well

## 2011-09-01 NOTE — Patient Instructions (Signed)

## 2011-09-01 NOTE — Assessment & Plan Note (Signed)
She feels she is doing better with increase in meds, she feels more comfortable in her apt and is doing well at home as well. Will continue same dosing for nwo and reevaluate in 6 weeks or as needed

## 2011-09-01 NOTE — Assessment & Plan Note (Signed)
Well controlled with current dietary changes and meds, no changes

## 2011-09-15 ENCOUNTER — Ambulatory Visit (INDEPENDENT_AMBULATORY_CARE_PROVIDER_SITE_OTHER): Payer: 59 | Admitting: Family Medicine

## 2011-09-15 ENCOUNTER — Encounter: Payer: Self-pay | Admitting: Family Medicine

## 2011-09-15 VITALS — BP 128/80 | HR 89 | Temp 99.2°F | Ht 66.0 in | Wt 134.1 lb

## 2011-09-15 DIAGNOSIS — F329 Major depressive disorder, single episode, unspecified: Secondary | ICD-10-CM

## 2011-09-15 DIAGNOSIS — F418 Other specified anxiety disorders: Secondary | ICD-10-CM

## 2011-09-15 DIAGNOSIS — F341 Dysthymic disorder: Secondary | ICD-10-CM

## 2011-09-15 DIAGNOSIS — K219 Gastro-esophageal reflux disease without esophagitis: Secondary | ICD-10-CM

## 2011-09-15 MED ORDER — SERTRALINE HCL 100 MG PO TABS: 100.0000 mg | ORAL_TABLET | Freq: Every day | ORAL | Status: DC

## 2011-09-15 NOTE — Patient Instructions (Signed)
Depression  Depression is a strong emotion of feeling unhappy that can last for weeks, months, or even longer. Depression causes problems with the ability to function in life. It upsets your:   Relationships.   Sleep.   Eating habits.   Work habits.  HOME CARE  Take all medicine as told by your doctor.   Talk with a therapist, counselor, or friend.   Eat a healthy diet.   Exercise regularly.   Do not drink alcohol or use drugs.  GET HELP RIGHT AWAY IF: You start to have thoughts about hurting yourself or others. MAKE SURE YOU:  Understand these instructions.   Will watch your condition.   Will get help right away if you are not doing well or get worse.  Document Released: 07/11/2010 Document Revised: 05/28/2011 Document Reviewed: 07/11/2010 [employer] Patient Information 2012 [employer]. 

## 2011-09-16 ENCOUNTER — Encounter: Payer: Self-pay | Admitting: Family Medicine

## 2011-09-16 DIAGNOSIS — K219 Gastro-esophageal reflux disease without esophagitis: Secondary | ICD-10-CM | POA: Insufficient documentation

## 2011-09-16 NOTE — Assessment & Plan Note (Signed)
Doing better with dietary changes

## 2011-09-16 NOTE — Progress Notes (Signed)
Patient ID: Beverly Pearson, female   DOB: Feb 09, 1991, 21 y.o.   MRN: [id number] Beverly Pearson [id number] 1990-11-15 09/16/2011      Progress Note-Follow Up  Subjective  Chief Complaint  Chief Complaint  Patient presents with  . Depression    crazy dreams- been really depressed since last Thursday (went to moms to stay)    HPI  Patient is 21 yo AA female in today for evaluation. She is very tearful in the office of her depression is worsening. She denies any recent copy of stressors although when her mother joined her in the room she will addition to very sad about the death of her aunt 79 year old dog. She cries when discussing this in quite frequently throughout the exam as well. She says she still living independently with her roommate and actually does let her make an says she cannot understand why she is struggling with success and is frustrated. She was on Zoloft 100 mg daily but felt she was doing well and came down to 50. She is also stopped her counseling she got to feeling better. 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 She says she feels hopeless again in as it might be better if she weren't here but she does acknowledge she has no active plan and will not hurt herself if allowed to go home. No recent illness, fevers, chest pain, palpitation, shortness of breath, GI or GU complaints noted.  Past Medical History  Diagnosis Date  . GERD (gastroesophageal reflux disease)   . Constipation   . Reflux 02/17/2011  . Anxiety 02/17/2011  . Depression with anxiety 03/03/2011  . Reflux 03/03/2011  . Insomnia 09/01/2011  . Reflux 09/16/2011    Past Surgical History  Procedure Date  . Tonsillectomy and adenoidectomy     age 4    Family History  Problem Relation Age of Onset  . Colon cancer Neg Hx   . Diabetes Mother     type 2  . Hypertension Mother   . Diabetes Maternal Grandmother     type 2  . Cancer Maternal Grandmother 70    ovarian  .  Hypertension Maternal Grandmother   . Kidney failure Maternal Grandfather   . Hypertension Maternal Grandfather   . Kidney disease Maternal Grandfather   . Diabetes Paternal Grandmother     type 2  . Hypertension Paternal Grandmother   . Emphysema Paternal Grandfather     smoke  . COPD Paternal Grandfather   . Cancer Paternal Grandfather 14    prostate cancer    History   Social History  . Marital Status: Single    Spouse Name: N/A    Number of Children: 0  . Years of Education: N/A   Occupational History  . Unemployed    Social History Main Topics  . Smoking status: Current Some Day Smoker    Types: Cigarettes    Last Attempt to Quit: 07/17/2011  . Smokeless tobacco: Never Used   Comment: smokes 1 or 2 black and milds daily-  . Alcohol Use: No  . Drug Use: No     marijuana-2 G or more daily  . Sexually Active: No   Other Topics Concern  . Not on file   Social History Narrative   Occasional soda     Current Outpatient Prescriptions on File Prior to Visit  Medication Sig Dispense Refill  . Levonorgestrel-Ethinyl Estradiol (SEASONIQUE) 0.15-0.03 &0.01 MG tablet Take 1 tablet by  mouth daily.        Marland Kitchen LORazepam (ATIVAN) 0.5 MG tablet Take 1 tablet (0.5 mg total) by mouth 2 (two) times daily as needed for anxiety (or insomnia).  40 tablet  1  . Multiple Vitamin (MULTIVITAMIN) tablet Take 1 tablet by mouth daily as needed.        . ondansetron (ZOFRAN) 4 MG tablet Take 1 tablet (4 mg total) by mouth as needed.  20 tablet  1  . ranitidine (ZANTAC) 300 MG tablet Take 1 tablet (300 mg total) by mouth daily as needed for heartburn.  30 tablet  2    No Known Allergies  Review of Systems  Review of Systems  Constitutional: Negative for fever and malaise/fatigue.  HENT: Negative for congestion.   Eyes: Negative for discharge.  Respiratory: Negative for shortness of breath.   Cardiovascular: Negative for chest pain, palpitations and leg swelling.  Gastrointestinal:  Negative for nausea, abdominal pain and diarrhea.  Genitourinary: Negative for dysuria.  Musculoskeletal: Negative for falls.  Skin: Negative for rash.  Neurological: Negative for loss of consciousness and headaches.  Endo/Heme/Allergies: Negative for polydipsia.  Psychiatric/Behavioral: Positive for depression. Negative for suicidal ideas. The patient is nervous/anxious. The patient does not have insomnia.     Objective  BP 128/80  Pulse 89  Temp(Src) 99.2 F (37.3 C) (Temporal)  Ht 5\' 6"  (1.676 m)  Wt 134 lb 1.9 oz (60.836 kg)  BMI 21.65 kg/m2  SpO2 97%  Physical Exam  Physical Exam  Constitutional: She is oriented to person, place, and time. She appears distressed.  HENT:  Head: Normocephalic and atraumatic.  Eyes: Conjunctivae are normal.  Neck: Neck supple. No thyromegaly present.  Cardiovascular: Normal rate, regular rhythm and normal heart sounds.   No murmur heard. Pulmonary/Chest: Effort normal and breath sounds normal. She has no wheezes.  Abdominal: She exhibits no distension and no mass.  Musculoskeletal: She exhibits no edema.  Lymphadenopathy:    She has no cervical adenopathy.  Neurological: She is alert and oriented to person, place, and time.  Skin: Skin is warm and dry. No rash noted. She is not diaphoretic.  Psychiatric: Memory and judgment normal.       tearful    Lab Results  Component Value Date   TSH 0.74 04/01/2011   Lab Results  Component Value Date   WBC 11.4* 03/02/2011   HGB 14.5 03/02/2011   HCT 43.0 03/02/2011   MCV 86.0 03/02/2011   PLT 367 03/02/2011   Lab Results  Component Value Date   CREATININE 0.68 03/02/2011   BUN 6 03/02/2011   NA 136 03/02/2011   K 3.5 03/02/2011   CL 100 03/02/2011   CO2 24 03/02/2011   Lab Results  Component Value Date   ALT 70* 03/02/2011   AST 32 03/02/2011   ALKPHOS 59 03/02/2011   BILITOT 0.3 03/02/2011     Assessment & Plan  Depression with anxiety Patient worsening again. She is here today with  her mother and she agrees not to harm herself and her mother will stay in close contact with her. She seems to have done somewhat better on the Sertraline at 100 mg so we will increase back up to that amount and she is asked to commit to at least 6 to 12 months at that dose provided she has no difficulty and she starts to improve. We will refer to Psychiatry for further evaluation and she agrees to restart her counseling with Judithe Modest. We will  see her as needed or in 2 weeks. She has been smoking marijuana some lately and is reminded this contributes to depressed mood so she needs to try and quit again. Greater than 30 minutes is spent with the patient with 95% of that time spent in counseling  Reflux Doing better with dietary changes  Improved with dietary changes and Zantac.

## 2011-09-16 NOTE — Assessment & Plan Note (Signed)
Improved with dietary changes and Zantac.

## 2011-09-16 NOTE — Assessment & Plan Note (Addendum)
Patient worsening again. She is here today with her mother and she agrees not to harm herself and her mother will stay in close contact with her. She seems to have done somewhat better on the Sertraline at 100 mg so we will increase back up to that amount and she is asked to commit to at least 6 to 12 months at that dose provided she has no difficulty and she starts to improve. We will refer to Psychiatry for further evaluation and she agrees to restart her counseling with Judithe Modest. We will see her as needed or in 2 weeks. She has been smoking marijuana some lately and is reminded this contributes to depressed mood so she needs to try and quit again. Greater than 30 minutes is spent with the patient with 95% of that time spent in counseling

## 2011-09-29 ENCOUNTER — Ambulatory Visit: Payer: 59 | Admitting: Family Medicine

## 2011-09-30 ENCOUNTER — Ambulatory Visit: Payer: 59 | Admitting: Family Medicine

## 2011-09-30 DIAGNOSIS — Z0289 Encounter for other administrative examinations: Secondary | ICD-10-CM

## 2011-10-09 ENCOUNTER — Ambulatory Visit: Payer: 59 | Admitting: Family Medicine

## 2011-10-13 ENCOUNTER — Ambulatory Visit: Payer: 59 | Admitting: Family Medicine

## 2012-04-14 ENCOUNTER — Ambulatory Visit: Payer: 59

## 2012-05-09 ENCOUNTER — Other Ambulatory Visit: Payer: Self-pay | Admitting: Family Medicine

## 2012-07-25 ENCOUNTER — Encounter: Payer: Self-pay | Admitting: Obstetrics and Gynecology

## 2012-07-25 ENCOUNTER — Ambulatory Visit: Payer: BC Managed Care – PPO | Admitting: Obstetrics and Gynecology

## 2012-07-25 VITALS — BP 110/70 | Temp 98.3°F | Ht 66.0 in | Wt 158.0 lb

## 2012-07-25 DIAGNOSIS — N898 Other specified noninflammatory disorders of vagina: Secondary | ICD-10-CM

## 2012-07-25 DIAGNOSIS — Z113 Encounter for screening for infections with a predominantly sexual mode of transmission: Secondary | ICD-10-CM

## 2012-07-25 DIAGNOSIS — Z01419 Encounter for gynecological examination (general) (routine) without abnormal findings: Secondary | ICD-10-CM

## 2012-07-25 LAB — POCT WET PREP (WET MOUNT)

## 2012-07-25 MED ORDER — LEVONORGEST-ETH ESTRAD 91-DAY 0.15-0.03 &0.01 MG PO TABS: 1.0000 | ORAL_TABLET | Freq: Every day | ORAL | Status: DC

## 2012-07-25 MED ORDER — AMBULATORY NON FORMULARY MEDICATION: 600.0000 mg | Status: DC

## 2012-07-25 NOTE — Progress Notes (Signed)
Subjective:    Beverly Pearson is a 22 y.o. female, G0P0, who presents for an annual exam. The patient requests STD testing.  Menstrual cycle:   LMP: Patient's last menstrual period was 07/05/2012.             Review of Systems Pertinent items are noted in HPI. Denies pelvic pain, urinary tract symptoms, vaginitis symptoms, irregular bleeding, menopausal symptoms, change in bowel habits or rectal bleeding   Objective:    BP 110/70  Temp 98.3 F (36.8 C) (Oral)  Ht 5\' 6"  (1.676 m)  Wt 158 lb (71.668 kg)  BMI 25.50 kg/m2  LMP 07/05/2012    Wt Readings from Last 1 Encounters:  07/25/12 158 lb (71.668 kg)   Body mass index is 25.50 kg/(m^2). General Appearance: Alert, no acute distress HEENT: Grossly normal Neck / Thyroid: Supple, no thyromegaly or cervical adenopathy Lungs: Clear to auscultation bilaterally Back: No CVA tenderness Breast Exam: No masses or nodes.No dimpling, nipple retraction or discharge. Cardiovascular: Regular rate and rhythm.  Gastrointestinal: Soft, non-tender, no masses or organomegaly Pelvic Exam: EGBUS-wnl, vagina-normal rugae, cervix- without lesions or tenderness, uterus appears normal size shape and consistency, adnexae-no masses or tenderness Lymphatic Exam: Non-palpable nodes in neck, clavicular,  axillary, or inguinal regions  Skin: no rashes or abnormalities Extremities: no clubbing cyanosis or edema  Neurologic: grossly normal Psychiatric: Alert and oriented  Wet Prep:  ph-4.5, whiff-negative    Assessment:   Routine GYN Exam   Plan:    PAP sent  Boric Acid Capsules 600 mg #30  1 pv twice weekly x 4 weeks then prn 11 refills  Seasonique #1 1 po qd 4 refills  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

## 2012-07-25 NOTE — Patient Instructions (Signed)
[place] pharmacies that carry Boric Acid Capsules/Suppositories:  [facility] [address] (only)  [phone]  [facility]  [address] ([facility])  [phone]  [facility] ([place])  [address] [phone]  [facility]  [address] [phone]  [facility] [address] [place], [place] [phone]     

## 2012-07-25 NOTE — Progress Notes (Signed)
Regular Periods: yes Mammogram: no  Monthly Breast Ex.: no Exercise: no  Tetanus < 10 years: yes Seatbelts: yes  NI. Bladder Functn.: yes Abuse at home: no  Daily BM's: yes Stressful Work: yes  Healthy Diet: yes Sigmoid-Colonoscopy: no  Calcium: no Medical problems this year: vaginal odor   LAST PAP:2008  Contraception: SEASONIQUE  Mammogram:  NO  PCP: DR. Abner Greenspan  PMH: NO CHANGE  FMH: NO CHANGE  Last Bone Scan: NO  PT IS SINGLE

## 2012-07-26 LAB — HEPATITIS B SURFACE ANTIGEN: Hepatitis B Surface Ag: NEGATIVE

## 2012-07-26 LAB — GC/CHLAMYDIA PROBE AMP: GC Probe RNA: NEGATIVE

## 2012-07-26 LAB — HIV ANTIBODY (ROUTINE TESTING W REFLEX): HIV: NONREACTIVE

## 2012-07-26 LAB — RPR

## 2012-07-27 ENCOUNTER — Telehealth: Payer: Self-pay | Admitting: Obstetrics and Gynecology

## 2012-07-27 MED ORDER — AZITHROMYCIN 500 MG PO TABS: 1000.0000 mg | ORAL_TABLET | Freq: Every day | ORAL | Status: DC

## 2012-07-27 NOTE — Telephone Encounter (Signed)
Pt returned call, informed pt that she had a negative GC but a positive CT.  STD protocol reviewed with pt, pt states her last IC was in April 2013 and is unsure on how to get in touch with the partner.  [facility] completed and faxed.  Rx for Zithomax e-prescribed to pt's pharmacy of choice.  TOC scheduled for Monday 08/22/12 @ 1400 w/ EP.

## 2012-07-27 NOTE — Telephone Encounter (Signed)
Message copied by Delon Sacramento on Wed Jul 27, 2012 10:52 AM ------      Message from: Henreitta Leber      Created: Tue Jul 26, 2012 10:20 PM       Patient with a positive chlamydia needs treatment with Zithromax 500 mg  #2  2 po stat no refills and implementation of the STD Protocol.  Thank you.  POWELL,ELMIRA, PA-C

## 2012-08-22 ENCOUNTER — Encounter: Payer: BC Managed Care – PPO | Admitting: Obstetrics and Gynecology

## 2012-08-26 ENCOUNTER — Encounter: Payer: BC Managed Care – PPO | Admitting: Obstetrics and Gynecology

## 2012-09-02 ENCOUNTER — Ambulatory Visit: Payer: BC Managed Care – PPO | Admitting: Obstetrics and Gynecology

## 2012-09-02 ENCOUNTER — Encounter: Payer: Self-pay | Admitting: Obstetrics and Gynecology

## 2012-09-02 VITALS — BP 110/70 | HR 78 | Wt 159.0 lb

## 2012-09-02 DIAGNOSIS — Z113 Encounter for screening for infections with a predominantly sexual mode of transmission: Secondary | ICD-10-CM

## 2012-09-02 NOTE — Progress Notes (Signed)
21 YO with recent chlamydia (treatment 07/24/12) returns for test of cure.  Denies any complaints.  O: Abdomen: soft, non-tender without masses       Pelvic: EGBUS-wnl, vagina-normal, cervix-no lesions (speculum exam only performed)  A: Chlamydia Test of Cure  P:  GC/CT-pending        RTO-as scheduled or prn  POWELL,ELMIRA, PA-C

## 2012-09-03 LAB — GC/CHLAMYDIA PROBE AMP: CT Probe RNA: NEGATIVE

## 2012-11-09 ENCOUNTER — Other Ambulatory Visit: Payer: Self-pay | Admitting: Family Medicine

## 2012-11-09 NOTE — Telephone Encounter (Signed)
I will deny RX. I tried to different numbers but neither vm stated a name so I don't want to leave a vm in case its not patients number anymore

## 2012-11-09 NOTE — Telephone Encounter (Signed)
Has not seen me in over a year will need an appt to receive more medicine

## 2012-11-09 NOTE — Telephone Encounter (Signed)
Refill request for Ativan Last filled by MD on -08/14/12 Last seen-09/14/12 F/U- none Please advise refill?

## 2013-03-08 ENCOUNTER — Ambulatory Visit (INDEPENDENT_AMBULATORY_CARE_PROVIDER_SITE_OTHER): Payer: BC Managed Care – PPO | Admitting: Family Medicine

## 2013-03-08 ENCOUNTER — Encounter: Payer: Self-pay | Admitting: Family Medicine

## 2013-03-08 VITALS — BP 148/74 | HR 72 | Temp 98.3°F | Ht 66.0 in | Wt 157.0 lb

## 2013-03-08 DIAGNOSIS — F341 Dysthymic disorder: Secondary | ICD-10-CM

## 2013-03-08 DIAGNOSIS — K219 Gastro-esophageal reflux disease without esophagitis: Secondary | ICD-10-CM

## 2013-03-08 DIAGNOSIS — F329 Major depressive disorder, single episode, unspecified: Secondary | ICD-10-CM

## 2013-03-08 DIAGNOSIS — F418 Other specified anxiety disorders: Secondary | ICD-10-CM

## 2013-03-08 DIAGNOSIS — R109 Unspecified abdominal pain: Secondary | ICD-10-CM

## 2013-03-08 MED ORDER — SERTRALINE HCL 50 MG PO TABS: ORAL_TABLET | ORAL | Status: DC

## 2013-03-08 MED ORDER — ONDANSETRON HCL 4 MG PO TABS: 4.0000 mg | ORAL_TABLET | Freq: Three times a day (TID) | ORAL | Status: DC | PRN

## 2013-03-08 MED ORDER — HYOSCYAMINE SULFATE 0.125 MG SL SUBL: 0.1250 mg | SUBLINGUAL_TABLET | SUBLINGUAL | Status: DC | PRN

## 2013-03-08 MED ORDER — LORAZEPAM 0.5 MG PO TABS: 0.5000 mg | ORAL_TABLET | Freq: Three times a day (TID) | ORAL | Status: DC | PRN

## 2013-03-08 MED ORDER — RANITIDINE HCL 300 MG PO TABS: 300.0000 mg | ORAL_TABLET | Freq: Every day | ORAL | Status: DC

## 2013-03-08 NOTE — Progress Notes (Signed)
Patient ID: Beverly Pearson, female   DOB: 08/07/1990, 22 y.o.   MRN: [id number] Beverly Pearson [id number] [birth year] 03/08/2013      Progress Note-Follow Up  Subjective  Chief Complaint  Chief Complaint  Patient presents with  . Medication Refill    HPI  Patient is a 22 year old [place] American female who is in today to discuss restarting medications for anxiety and depression prior to heading back to school. She had been doing well so has not been in quite some time. 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 Would like to restart her sertraline which was helpful in the past. No suicidality is noted but she does still struggles with anhedonia and anxiety. No recent illness. Constipation and heartburn are improved but do still occur. No chest pain, shortness of breath, headache or acute illness is noted.  Past Medical History  Diagnosis Date  . GERD (gastroesophageal reflux disease)   . Constipation   . Reflux 02/17/2011  . Anxiety 02/17/2011  . Depression with anxiety 03/03/2011  . Reflux 03/03/2011  . Insomnia 09/01/2011  . Reflux 09/16/2011  . Herpes simplex without mention of complication     Past Surgical History  Procedure Laterality Date  . Tonsillectomy and adenoidectomy      age 7    Family History  Problem Relation Age of Onset  . Colon cancer Neg Hx   . Diabetes Mother     type 2  . Hypertension Mother   . Diabetes Maternal Grandmother     type 2  . Cancer Maternal Grandmother 70    ovarian  . Hypertension Maternal Grandmother   . Kidney failure Maternal Grandfather   . Hypertension Maternal Grandfather   . Kidney disease Maternal Grandfather   . Diabetes Paternal Grandmother     type 2  . Hypertension Paternal Grandmother   . Emphysema Paternal Grandfather     smoke  . COPD Paternal Grandfather   . Cancer Paternal Grandfather 22    prostate cancer    History   Social History  . Marital  Status: Single    Spouse Name: N/A    Number of Children: 0  . Years of Education: N/A   Occupational History  . Unemployed    Social History Main Topics  . Smoking status: Current Some Day Smoker    Types: Cigars    Last Attempt to Quit: 07/17/2011  . Smokeless tobacco: Never Used     Comment: smokes 1 or 2 black and milds daily-  . Alcohol Use: Yes  . Drug Use: Yes     Comment: marijuana-2 G or more daily  . Sexual Activity: Yes    Birth Control/ Protection: Pill     Comment: seasonique   Other Topics Concern  . Not on file   Social History Narrative   Occasional soda     Current Outpatient Prescriptions on File Prior to Visit  Medication Sig Dispense Refill  . Levonorgestrel-Ethinyl Estradiol (SEASONIQUE) 0.15-0.03 &0.01 MG tablet Take 1 tablet by mouth daily.  1 Package  11  . Multiple Vitamin (MULTIVITAMIN) tablet Take 1 tablet by mouth daily as needed.        . hyoscyamine (LEVSIN/SL) 0.125 MG SL tablet Place 1 tablet (0.125 mg total) under the tongue 2 (two) times daily as needed for cramping.  60 tablet  1   No current facility-administered medications on file prior to visit.  No Known Allergies  Review of Systems  Review of Systems  Constitutional: Negative for fever and malaise/fatigue.  HENT: Negative for congestion.   Eyes: Negative for discharge.  Respiratory: Negative for shortness of breath.   Cardiovascular: Negative for chest pain, palpitations and leg swelling.  Gastrointestinal: Negative for nausea, abdominal pain and diarrhea.  Genitourinary: Negative for dysuria.  Musculoskeletal: Negative for falls.  Skin: Negative for rash.  Neurological: Negative for loss of consciousness and headaches.  Endo/Heme/Allergies: Negative for polydipsia.  Psychiatric/Behavioral: Negative for depression and suicidal ideas. The patient is not nervous/anxious and does not have insomnia.     Objective  BP 148/74  Pulse 72  Temp(Src) 98.3 F (36.8 C) (Oral)   Ht 5\' 6"  (1.676 m)  Wt 157 lb (71.215 kg)  BMI 25.35 kg/m2  SpO2 97%  LMP 12/20/2012  Physical Exam  Physical Exam  Constitutional: She is oriented to person, place, and time and well-developed, well-nourished, and in no distress. No distress.  HENT:  Head: Normocephalic and atraumatic.  Eyes: Conjunctivae are normal.  Neck: Neck supple. No thyromegaly present.  Cardiovascular: Normal rate, regular rhythm and normal heart sounds.   No murmur heard. Pulmonary/Chest: Effort normal and breath sounds normal. She has no wheezes.  Abdominal: She exhibits no distension and no mass.  Musculoskeletal: She exhibits no edema.  Lymphadenopathy:    She has no cervical adenopathy.  Neurological: She is alert and oriented to person, place, and time.  Skin: Skin is warm and dry. No rash noted. She is not diaphoretic.  Psychiatric: Memory, affect and judgment normal.    Lab Results  Component Value Date   TSH 0.74 04/01/2011   Lab Results  Component Value Date   WBC 11.4* 03/02/2011   HGB 14.5 03/02/2011   HCT 43.0 03/02/2011   MCV 86.0 03/02/2011   PLT 367 03/02/2011   Lab Results  Component Value Date   CREATININE 0.68 03/02/2011   BUN 6 03/02/2011   NA 136 03/02/2011   K 3.5 03/02/2011   CL 100 03/02/2011   CO2 24 03/02/2011   Lab Results  Component Value Date   ALT 70* 03/02/2011   AST 32 03/02/2011   ALKPHOS 59 03/02/2011   BILITOT 0.3 03/02/2011     Assessment & Plan  Depression with anxiety Has not ben in in a Perezgarcia time but had been doing notably better. Has decided to return to design school in [place] and would like to restart Sertraline and Lorazepam before leaving. She agrees to seek out an Oncologist at school and work closely with them while she is there.  Reflux Doing better, encouraged probiotics, fiber and fluids. May use Zantac prn

## 2013-03-08 NOTE — Patient Instructions (Addendum)
Consider a meningitis shot and a flu shot prior to leaving for school Start probiotics Digestive Advantage or a generic daily Ginger can helps nausea  Gastroesophageal Reflux Disease, Adult Gastroesophageal reflux disease (GERD) happens when acid from your stomach flows up into the esophagus. When acid comes in contact with the esophagus, the acid causes soreness (inflammation) in the esophagus. Over time, GERD may create small holes (ulcers) in the lining of the esophagus. CAUSES   Increased body weight. This puts pressure on the stomach, making acid rise from the stomach into the esophagus.  Smoking. This increases acid production in the stomach.  Drinking alcohol. This causes decreased pressure in the lower esophageal sphincter (valve or ring of muscle between the esophagus and stomach), allowing acid from the stomach into the esophagus.  Late evening meals and a full stomach. This increases pressure and acid production in the stomach.  A malformed lower esophageal sphincter. Sometimes, no cause is found. SYMPTOMS   Burning pain in the lower part of the mid-chest behind the breastbone and in the mid-stomach area. This may occur twice a week or more often.  Trouble swallowing.  Sore throat.  Dry cough.  Asthma-like symptoms including chest tightness, shortness of breath, or wheezing. DIAGNOSIS  Your caregiver may be able to diagnose GERD based on your symptoms. In some cases, X-rays and other tests may be done to check for complications or to check the condition of your stomach and esophagus. TREATMENT  Your caregiver may recommend over-the-counter or prescription medicines to help decrease acid production. Ask your caregiver before starting or adding any new medicines.  HOME CARE INSTRUCTIONS   Change the factors that you can control. Ask your caregiver for guidance concerning weight loss, quitting smoking, and alcohol consumption.  Avoid foods and drinks that make your  symptoms worse, such as:  Caffeine or alcoholic drinks.  Chocolate.  Peppermint or mint flavorings.  Garlic and onions.  Spicy foods.  Citrus fruits, such as oranges, lemons, or limes.  Tomato-based foods such as sauce, chili, salsa, and pizza.  Fried and fatty foods.  Avoid lying down for the 3 hours prior to your bedtime or prior to taking a nap.  Eat small, frequent meals instead of large meals.  Wear loose-fitting clothing. Do not wear anything tight around your waist that causes pressure on your stomach.  Raise the head of your bed 6 to 8 inches with wood blocks to help you sleep. Extra pillows will not help.  Only take over-the-counter or prescription medicines for pain, discomfort, or fever as directed by your caregiver.  Do not take aspirin, ibuprofen, or other nonsteroidal anti-inflammatory drugs (NSAIDs). SEEK IMMEDIATE MEDICAL CARE IF:   You have pain in your arms, neck, jaw, teeth, or back.  Your pain increases or changes in intensity or duration.  You develop nausea, vomiting, or sweating (diaphoresis).  You develop shortness of breath, or you faint.  Your vomit is green, yellow, black, or looks like coffee grounds or blood.  Your stool is red, bloody, or black. These symptoms could be signs of other problems, such as heart disease, gastric bleeding, or esophageal bleeding. MAKE SURE YOU:   Understand these instructions.  Will watch your condition.  Will get help right away if you are not doing well or get worse. Document Released: 03/18/2005 Document Revised: 08/31/2011 Document Reviewed: 12/26/2010 [facility] Patient Information 2014 [place], [place].

## 2013-03-11 NOTE — Assessment & Plan Note (Signed)
Has not ben in in a Syler time but had been doing notably better. Has decided to return to design school in [place] and would like to restart Sertraline and Lorazepam before leaving. She agrees to seek out an Oncologist at school and work closely with them while she is there.

## 2013-03-11 NOTE — Assessment & Plan Note (Addendum)
Doing better, encouraged probiotics, fiber and fluids. May use Zantac prn

## 2013-03-14 ENCOUNTER — Telehealth: Payer: Self-pay | Admitting: Family Medicine

## 2013-03-14 NOTE — Telephone Encounter (Signed)
Received medical records from [facility]  P: [phone] F: [phone]

## 2013-06-13 ENCOUNTER — Encounter: Payer: BC Managed Care – PPO | Admitting: Family Medicine

## 2013-06-13 DIAGNOSIS — Z0289 Encounter for other administrative examinations: Secondary | ICD-10-CM

## 2014-03-10 ENCOUNTER — Other Ambulatory Visit: Payer: Self-pay | Admitting: Family Medicine

## 2015-04-05 ENCOUNTER — Ambulatory Visit: Payer: Self-pay | Admitting: Family Medicine

## 2015-04-05 ENCOUNTER — Telehealth: Payer: Self-pay | Admitting: Family Medicine

## 2015-04-05 NOTE — Telephone Encounter (Signed)
Pt will be no show today 04/05/15 3:45pm. She called in 10/14 10:32am asking if it is too late to reschedule without charge. I advised her she can reschedule but charge is at provider discretion. She did not provide reason for cancellation. Rescheduled for 06/11/15. Charge or no charge?

## 2015-04-05 NOTE — Telephone Encounter (Signed)
No charge. 

## 2015-04-11 ENCOUNTER — Other Ambulatory Visit (HOSPITAL_COMMUNITY)
Admission: RE | Admit: 2015-04-11 | Discharge: 2015-04-11 | Disposition: A | Discharge status: Home / Self Care | Payer: BLUE CROSS/BLUE SHIELD | Source: Ambulatory Visit | Attending: Family Medicine | Admitting: Family Medicine

## 2015-04-11 ENCOUNTER — Ambulatory Visit (INDEPENDENT_AMBULATORY_CARE_PROVIDER_SITE_OTHER): Payer: BLUE CROSS/BLUE SHIELD | Admitting: Family Medicine

## 2015-04-11 ENCOUNTER — Encounter: Payer: Self-pay | Admitting: Family Medicine

## 2015-04-11 VITALS — BP 124/88 | HR 81 | Temp 98.4°F | Ht 66.0 in | Wt 172.0 lb

## 2015-04-11 DIAGNOSIS — N760 Acute vaginitis: Secondary | ICD-10-CM | POA: Diagnosis not present

## 2015-04-11 DIAGNOSIS — K219 Gastro-esophageal reflux disease without esophagitis: Secondary | ICD-10-CM

## 2015-04-11 DIAGNOSIS — R591 Generalized enlarged lymph nodes: Secondary | ICD-10-CM | POA: Diagnosis not present

## 2015-04-11 DIAGNOSIS — Z113 Encounter for screening for infections with a predominantly sexual mode of transmission: Secondary | ICD-10-CM | POA: Insufficient documentation

## 2015-04-11 DIAGNOSIS — Z23 Encounter for immunization: Secondary | ICD-10-CM

## 2015-04-11 DIAGNOSIS — E663 Overweight: Secondary | ICD-10-CM

## 2015-04-11 DIAGNOSIS — R1084 Generalized abdominal pain: Secondary | ICD-10-CM | POA: Diagnosis not present

## 2015-04-11 DIAGNOSIS — IMO0001 Reserved for inherently not codable concepts without codable children: Secondary | ICD-10-CM

## 2015-04-11 DIAGNOSIS — G4700 Insomnia, unspecified: Secondary | ICD-10-CM

## 2015-04-11 MED ORDER — RANITIDINE HCL 300 MG PO TABS: 300.0000 mg | ORAL_TABLET | Freq: Every day | ORAL | Status: DC

## 2015-04-11 MED ORDER — PHENTERMINE HCL 37.5 MG PO CAPS: 37.5000 mg | ORAL_CAPSULE | ORAL | Status: DC

## 2015-04-11 NOTE — Progress Notes (Signed)
Pre visit review using our clinic review tool, if applicable. No additional management support is needed unless otherwise documented below in the visit note. 

## 2015-04-11 NOTE — Patient Instructions (Signed)
DASH Eating Plan  DASH stands for "Dietary Approaches to Stop Hypertension." The DASH eating plan is a healthy eating plan that has been shown to reduce high blood pressure (hypertension). Additional health benefits may include reducing the risk of type 2 diabetes mellitus, heart disease, and stroke. The DASH eating plan may also help with weight loss.  WHAT DO I NEED TO KNOW ABOUT THE DASH EATING PLAN?  For the DASH eating plan, you will follow these general guidelines:  · Choose foods with a percent daily value for sodium of less than 5% (as listed on the food label).  · Use salt-free seasonings or herbs instead of table salt or sea salt.  · Check with your health care provider or pharmacist before using salt substitutes.  · Eat lower-sodium products, often labeled as "lower sodium" or "no salt added."  · Eat fresh foods.  · Eat more vegetables, fruits, and low-fat dairy products.  · Choose whole grains. Look for the word "whole" as the first word in the ingredient list.  · Choose fish and skinless chicken or turkey more often than red meat. Limit fish, poultry, and meat to 6 oz (170 g) each day.  · Limit sweets, desserts, sugars, and sugary drinks.  · Choose heart-healthy fats.  · Limit cheese to 1 oz (28 g) per day.  · Eat more home-cooked food and less restaurant, buffet, and fast food.  · Limit fried foods.  · Cook foods using methods other than frying.  · Limit canned vegetables. If you do use them, rinse them well to decrease the sodium.  · When eating at a restaurant, ask that your food be prepared with less salt, or no salt if possible.  WHAT FOODS CAN I EAT?  Seek help from a dietitian for individual calorie needs.  Grains  Whole grain or whole wheat bread. Brown rice. Whole grain or whole wheat pasta. Quinoa, bulgur, and whole grain cereals. Low-sodium cereals. Corn or whole wheat flour tortillas. Whole grain cornbread. Whole grain crackers. Low-sodium crackers.  Vegetables  Fresh or frozen vegetables  (raw, steamed, roasted, or grilled). Low-sodium or reduced-sodium tomato and vegetable juices. Low-sodium or reduced-sodium tomato sauce and paste. Low-sodium or reduced-sodium canned vegetables.   Fruits  All fresh, canned (in natural juice), or frozen fruits.  Meat and Other Protein Products  Ground beef (85% or leaner), grass-fed beef, or beef trimmed of fat. Skinless chicken or turkey. Ground chicken or turkey. Pork trimmed of fat. All fish and seafood. Eggs. Dried beans, peas, or lentils. Unsalted nuts and seeds. Unsalted canned beans.  Dairy  Low-fat dairy products, such as skim or 1% milk, 2% or reduced-fat cheeses, low-fat ricotta or cottage cheese, or plain low-fat yogurt. Low-sodium or reduced-sodium cheeses.  Fats and Oils  Tub margarines without trans fats. Light or reduced-fat mayonnaise and salad dressings (reduced sodium). Avocado. Safflower, olive, or canola oils. Natural peanut or almond butter.  Other  Unsalted popcorn and pretzels.  The items listed above may not be a complete list of recommended foods or beverages. Contact your dietitian for more options.  WHAT FOODS ARE NOT RECOMMENDED?  Grains  White bread. White pasta. White rice. Refined cornbread. Bagels and croissants. Crackers that contain trans fat.  Vegetables  Creamed or fried vegetables. Vegetables in a cheese sauce. Regular canned vegetables. Regular canned tomato sauce and paste. Regular tomato and vegetable juices.  Fruits  Dried fruits. Canned fruit in light or heavy syrup. Fruit juice.  Meat and Other Protein   Products  Fatty cuts of meat. Ribs, chicken wings, bacon, sausage, bologna, salami, chitterlings, fatback, hot dogs, bratwurst, and packaged luncheon meats. Salted nuts and seeds. Canned beans with salt.  Dairy  Whole or 2% milk, cream, half-and-half, and cream cheese. Whole-fat or sweetened yogurt. Full-fat cheeses or blue cheese. Nondairy creamers and whipped toppings. Processed cheese, cheese spreads, or cheese  curds.  Condiments  Onion and garlic salt, seasoned salt, table salt, and sea salt. Canned and packaged gravies. Worcestershire sauce. Tartar sauce. Barbecue sauce. Teriyaki sauce. Soy sauce, including reduced sodium. Steak sauce. Fish sauce. Oyster sauce. Cocktail sauce. Horseradish. Ketchup and mustard. Meat flavorings and tenderizers. Bouillon cubes. Hot sauce. Tabasco sauce. Marinades. Taco seasonings. Relishes.  Fats and Oils  Butter, stick margarine, lard, shortening, ghee, and bacon fat. Coconut, palm kernel, or palm oils. Regular salad dressings.  Other  Pickles and olives. Salted popcorn and pretzels.  The items listed above may not be a complete list of foods and beverages to avoid. Contact your dietitian for more information.  WHERE CAN I FIND MORE INFORMATION?  National [employer], [employer], [employer]: www.nhlbi.nih.gov/health/health-topics/topics/dash/     This information is not intended to replace advice given to you by your health care provider. Make sure you discuss any questions you have with your health care provider.     Document Released: 05/28/2011 Document Revised: 06/29/2014 Document Reviewed: 04/12/2013  Elsevier Interactive Patient Education ©2016 [employer]

## 2015-04-12 LAB — COMPREHENSIVE METABOLIC PANEL
ALK PHOS: 48 U/L (ref 39–117)
ALT: 10 U/L (ref 0–35)
AST: 22 U/L (ref 0–37)
Albumin: 4.2 g/dL (ref 3.5–5.2)
BUN: 10 mg/dL (ref 6–23)
CO2: 20 meq/L (ref 19–32)
Calcium: 9.6 mg/dL (ref 8.4–10.5)
Chloride: 106 mEq/L (ref 96–112)
Creatinine, Ser: 0.76 mg/dL (ref 0.40–1.20)
GFR: 120.83 mL/min (ref >60.00)
GLUCOSE: 72 mg/dL (ref 70–99)
POTASSIUM: 4 meq/L (ref 3.5–5.1)
SODIUM: 140 meq/L (ref 135–145)
TOTAL PROTEIN: 7.7 g/dL (ref 6.0–8.3)
Total Bilirubin: 0.4 mg/dL (ref 0.2–1.2)

## 2015-04-12 LAB — CBC
HEMATOCRIT: 44.9 % (ref 36.0–46.0)
Hemoglobin: 14.8 g/dL (ref 12.0–15.0)
MCHC: 33 g/dL (ref 30.0–36.0)
MCV: 88.9 fl (ref 78.0–100.0)
Platelets: 329 10*3/uL (ref 150.0–400.0)
RBC: 5.05 Mil/uL (ref 3.87–5.11)
RDW: 13.9 % (ref 11.5–15.5)
WBC: 8.5 10*3/uL (ref 4.0–10.5)

## 2015-04-12 LAB — HIV ANTIBODY (ROUTINE TESTING W REFLEX): HIV 1&2 Ab, 4th Generation: NONREACTIVE

## 2015-04-12 LAB — RPR

## 2015-04-12 LAB — TSH: TSH: 1.47 u[IU]/mL (ref 0.35–4.50)

## 2015-04-12 LAB — MONONUCLEOSIS SCREEN: Mono Screen: NEGATIVE

## 2015-04-15 LAB — URINE CYTOLOGY ANCILLARY ONLY
Chlamydia: NEGATIVE
Neisseria Gonorrhea: NEGATIVE
Trichomonas: NEGATIVE

## 2015-04-21 ENCOUNTER — Encounter: Payer: Self-pay | Admitting: Family Medicine

## 2015-04-21 DIAGNOSIS — N760 Acute vaginitis: Secondary | ICD-10-CM | POA: Insufficient documentation

## 2015-04-21 DIAGNOSIS — E663 Overweight: Secondary | ICD-10-CM | POA: Insufficient documentation

## 2015-04-21 HISTORY — DX: Overweight: E66.3

## 2015-04-21 NOTE — Assessment & Plan Note (Signed)
Avoid offending foods, start probiotics. Do not eat large meals in late evening and consider raising head of bed.  

## 2015-04-21 NOTE — Assessment & Plan Note (Signed)
Ancillary testing negative. Encouraged probiotics and avoid harsh soaps

## 2015-04-21 NOTE — Progress Notes (Signed)
Subjective:    Patient ID: Beverly Pearson, female    DOB: 01-Jul-1990, 24 y.o.   MRN: [id number]  Chief Complaint  Patient presents with  . Follow-up    weight loss    HPI Patient is in today for follow up with numerous concerns. She is struggling with reflux frequently. Has been frustrated with weight gain and is struggling to loose it. No recent illness or fevers. Follows with GYN but is complaining of thick white vaginal discharge. Notes some recent cervical lymphadenopathy but this is improving. No other acute concerns. Notes some left breast pain. Denies CP/palp/SOB/HA/congestion/fevers or GU c/o. Taking meds as prescribed  Past Medical History  Diagnosis Date  . GERD (gastroesophageal reflux disease)   . Constipation   . Reflux 02/17/2011  . Anxiety 02/17/2011  . Depression with anxiety 03/03/2011  . Reflux 03/03/2011  . Insomnia 09/01/2011  . Reflux 09/16/2011  . Herpes simplex without mention of complication   . Overweight 04/21/2015    Past Surgical History  Procedure Laterality Date  . Tonsillectomy and adenoidectomy      age 40    Family History  Problem Relation Age of Onset  . Colon cancer Neg Hx   . Diabetes Mother     type 2  . Hypertension Mother   . Diabetes Maternal Grandmother     type 2  . Cancer Maternal Grandmother 70    ovarian  . Hypertension Maternal Grandmother   . Kidney failure Maternal Grandfather   . Hypertension Maternal Grandfather   . Kidney disease Maternal Grandfather   . Diabetes Paternal Grandmother     type 2  . Hypertension Paternal Grandmother   . Emphysema Paternal Grandfather     smoke  . COPD Paternal Grandfather   . Cancer Paternal Grandfather 55    prostate cancer    Social History   Social History  . Marital Status: Single    Spouse Name: N/A  . Number of Children: 0  . Years of Education: N/A   Occupational History  . Unemployed    Social History Main Topics  . Smoking status: Current Some Day Smoker   Types: Cigars    Last Attempt to Quit: 07/17/2011  . Smokeless tobacco: Never Used     Comment: smokes 1 or 2 black and milds daily-  . Alcohol Use: Yes  . Drug Use: Yes     Comment: marijuana-2 G or more daily  . Sexual Activity: Yes    Birth Control/ Protection: Pill     Comment: seasonique   Other Topics Concern  . Not on file   Social History Narrative   Occasional soda     Outpatient Prescriptions Prior to Visit  Medication Sig Dispense Refill  . Levonorgestrel-Ethinyl Estradiol (SEASONIQUE) 0.15-0.03 &0.01 MG tablet Take 1 tablet by mouth daily. 1 Package 11  . ranitidine (ZANTAC) 300 MG tablet Take 1 tablet (300 mg total) by mouth at bedtime. 30 tablet 5  . Multiple Vitamin (MULTIVITAMIN) tablet Take 1 tablet by mouth daily as needed.      . hyoscyamine (LEVSIN SL) 0.125 MG SL tablet Place 1 tablet (0.125 mg total) under the tongue every 4 (four) hours as needed for cramping. 30 tablet 3  . hyoscyamine (LEVSIN/SL) 0.125 MG SL tablet Place 1 tablet (0.125 mg total) under the tongue 2 (two) times daily as needed for cramping. 60 tablet 1  . LORazepam (ATIVAN) 0.5 MG tablet Take 1 tablet (0.5 mg total) by mouth every  8 (eight) hours as needed for anxiety. 40 tablet 2  . ondansetron (ZOFRAN) 4 MG tablet Take 1 tablet (4 mg total) by mouth every 8 (eight) hours as needed for nausea. 40 tablet 3  . sertraline (ZOLOFT) 50 MG tablet 1/2 tab po qd x 7 days then 1 tab po daily 30 tablet 3   No facility-administered medications prior to visit.    No Known Allergies  Review of Systems  Constitutional: Negative for fever and malaise/fatigue.  HENT: Negative for congestion.   Eyes: Negative for discharge.  Respiratory: Negative for shortness of breath.   Cardiovascular: Negative for chest pain, palpitations and leg swelling.  Gastrointestinal: Negative for nausea and abdominal pain.  Genitourinary: Negative for dysuria.  Musculoskeletal: Negative for falls.  Skin: Negative for  rash.  Neurological: Negative for loss of consciousness and headaches.  Endo/Heme/Allergies: Negative for environmental allergies.  Psychiatric/Behavioral: Negative for depression. The patient is not nervous/anxious.        Objective:    Physical Exam  Constitutional: She is oriented to person, place, and time. She appears well-developed and well-nourished. No distress.  HENT:  Head: Normocephalic and atraumatic.  Nose: Nose normal.  Eyes: Right eye exhibits no discharge. Left eye exhibits no discharge.  Neck: Normal range of motion. Neck supple.  Cardiovascular: Normal rate and regular rhythm.   No murmur heard. Pulmonary/Chest: Effort normal and breath sounds normal.  Abdominal: Soft. Bowel sounds are normal. There is no tenderness.  Musculoskeletal: She exhibits no edema.  Neurological: She is alert and oriented to person, place, and time.  Skin: Skin is warm and dry.  Psychiatric: She has a normal mood and affect.  Nursing note and vitals reviewed.   BP 124/88 mmHg  Pulse 81  Temp(Src) 98.4 F (36.9 C) (Oral)  Ht 5\' 6"  (1.676 m)  Wt 172 lb (78.019 kg)  BMI 27.77 kg/m2  SpO2 100%  LMP 03/29/2015 Wt Readings from Last 3 Encounters:  04/11/15 172 lb (78.019 kg)  03/08/13 157 lb (71.215 kg)  09/02/12 159 lb (72.122 kg)     Lab Results  Component Value Date   WBC 8.5 04/11/2015   HGB 14.8 04/11/2015   HCT 44.9 04/11/2015   PLT 329.0 04/11/2015   GLUCOSE 72 04/11/2015   ALT 10 04/11/2015   AST 22 04/11/2015   NA 140 04/11/2015   K 4.0 04/11/2015   CL 106 04/11/2015   CREATININE 0.76 04/11/2015   BUN 10 04/11/2015   CO2 20 04/11/2015   TSH 1.47 04/11/2015    Lab Results  Component Value Date   TSH 1.47 04/11/2015   Lab Results  Component Value Date   WBC 8.5 04/11/2015   HGB 14.8 04/11/2015   HCT 44.9 04/11/2015   MCV 88.9 04/11/2015   PLT 329.0 04/11/2015   Lab Results  Component Value Date   NA 140 04/11/2015   K 4.0 04/11/2015   CO2 20  04/11/2015   GLUCOSE 72 04/11/2015   BUN 10 04/11/2015   CREATININE 0.76 04/11/2015   BILITOT 0.4 04/11/2015   ALKPHOS 48 04/11/2015   AST 22 04/11/2015   ALT 10 04/11/2015   PROT 7.7 04/11/2015   ALBUMIN 4.2 04/11/2015   CALCIUM 9.6 04/11/2015   GFR 120.83 04/11/2015   No results found for: CHOL No results found for: HDL No results found for: LDLCALC No results found for: TRIG No results found for: CHOLHDL No results found for: 04/13/2015     Assessment & Plan:  Problem List Items Addressed This Visit    Vaginitis and vulvovaginitis    Ancillary testing negative. Encouraged probiotics and avoid harsh soaps      Relevant Orders   CBC (Completed)   TSH (Completed)   Comp Met (CMET) (Completed)   HIV antibody (with reflex) (Completed)   Monospot (Completed)   RPR (Completed)   Urine cytology ancillary only (Completed)   Reflux    Avoid offending foods, start probiotics. Do not eat large meals in late evening and consider raising head of bed.       Overweight    Encouraged DASH diet, decrease po intake and increase exercise as tolerated. Needs 7-8 hours of sleep nightly. Avoid trans fats, eat small, frequent meals every 4-5 hours with lean proteins, complex carbs and healthy fats. Minimize simple carbs, GMO foods. Patient requests rx, will try Phentermine, patient aware of potentially serious side effects and wishes to proceed.       Relevant Medications   phentermine 37.5 MG capsule   Insomnia    Encouraged good sleep hygiene such as dark, quiet room. No blue/green glowing lights such as computer screens in bedroom. No alcohol or stimulants in evening. Cut down on caffeine as able. Regular exercise is helpful but not just prior to bed time.        Other Visit Diagnoses    Encounter for immunization    -  Primary    Generalized abdominal pain        Relevant Medications    ranitidine (ZANTAC) 300 MG tablet    Other Relevant Orders    CBC (Completed)    TSH  (Completed)    Comp Met (CMET) (Completed)    HIV antibody (with reflex) (Completed)    Monospot (Completed)    RPR (Completed)    Urine cytology ancillary only (Completed)    Lymphadenopathy        Relevant Orders    CBC (Completed)    TSH (Completed)    Comp Met (CMET) (Completed)    HIV antibody (with reflex) (Completed)    Monospot (Completed)    RPR (Completed)    Urine cytology ancillary only (Completed)       I have discontinued Ms. Osmundson's hyoscyamine, sertraline, hyoscyamine, LORazepam, and ondansetron. I am also having her start on phentermine. Additionally, I am having her maintain her multivitamin, Levonorgestrel-Ethinyl Estradiol, and ranitidine.  Meds ordered this encounter  Medications  . ranitidine (ZANTAC) 300 MG tablet    Sig: Take 1 tablet (300 mg total) by mouth at bedtime.    Dispense:  90 tablet    Refill:  2  . phentermine 37.5 MG capsule    Sig: Take 1 capsule (37.5 mg total) by mouth every morning.    Dispense:  30 capsule    Refill:  0     Penni Homans, MD

## 2015-04-21 NOTE — Assessment & Plan Note (Signed)
Encouraged good sleep hygiene such as dark, quiet room. No blue/green glowing lights such as computer screens in bedroom. No alcohol or stimulants in evening. Cut down on caffeine as able. Regular exercise is helpful but not just prior to bed time.  

## 2015-04-21 NOTE — Assessment & Plan Note (Signed)
Encouraged DASH diet, decrease po intake and increase exercise as tolerated. Needs 7-8 hours of sleep nightly. Avoid trans fats, eat small, frequent meals every 4-5 hours with lean proteins, complex carbs and healthy fats. Minimize simple carbs, GMO foods. Patient requests rx, will try Phentermine, patient aware of potentially serious side effects and wishes to proceed.

## 2015-05-13 ENCOUNTER — Ambulatory Visit: Payer: BLUE CROSS/BLUE SHIELD | Admitting: Family Medicine

## 2015-06-11 ENCOUNTER — Ambulatory Visit: Payer: Self-pay | Admitting: Family Medicine

## 2015-11-06 LAB — HM PAP SMEAR: HM Pap smear: NORMAL

## 2016-08-09 ENCOUNTER — Encounter: Payer: Self-pay | Admitting: Family Medicine

## 2016-12-25 ENCOUNTER — Encounter: Payer: Self-pay | Admitting: Family Medicine

## 2016-12-25 ENCOUNTER — Ambulatory Visit (INDEPENDENT_AMBULATORY_CARE_PROVIDER_SITE_OTHER): Payer: BLUE CROSS/BLUE SHIELD | Admitting: Family Medicine

## 2016-12-25 DIAGNOSIS — R1084 Generalized abdominal pain: Secondary | ICD-10-CM

## 2016-12-25 DIAGNOSIS — E663 Overweight: Secondary | ICD-10-CM | POA: Diagnosis not present

## 2016-12-25 DIAGNOSIS — Z720 Tobacco use: Secondary | ICD-10-CM | POA: Diagnosis not present

## 2016-12-25 DIAGNOSIS — Z87891 Personal history of nicotine dependence: Secondary | ICD-10-CM | POA: Insufficient documentation

## 2016-12-25 HISTORY — DX: Tobacco use: Z72.0

## 2016-12-25 MED ORDER — VARENICLINE TARTRATE 1 MG PO TABS: 1.0000 mg | ORAL_TABLET | Freq: Two times a day (BID) | ORAL | 4 refills | Status: DC

## 2016-12-25 MED ORDER — VARENICLINE TARTRATE 0.5 MG X 11 & 1 MG X 42 PO MISC: ORAL | 0 refills | Status: DC

## 2016-12-25 MED ORDER — RANITIDINE HCL 300 MG PO TABS: 300.0000 mg | ORAL_TABLET | Freq: Every day | ORAL | 2 refills | Status: DC | PRN

## 2016-12-25 NOTE — Assessment & Plan Note (Signed)
Encouraged complete cessation. Discussed need to quit as relates to risk of numerous cancers, cardiac and pulmonary disease as well as neurologic complications. Counseled for greater than 3 minutes. Discussed options to quit will try Chantix

## 2016-12-25 NOTE — Patient Instructions (Signed)
Soak in hot water and vinegar nightly apply Lamisil, vick's vapor rub. Fungal Nail Infection Fungal nail infection is a common fungal infection of the toenails or fingernails. This condition affects toenails more often than fingernails. More than one nail may be infected. The condition can be passed from person to person (is contagious). What are the causes? This condition is caused by a fungus. Several types of funguses can cause the infection. These funguses are common in moist and warm areas. If your hands or feet come into contact with the fungus, it may get into a crack in your fingernail or toenail and cause the infection. What increases the risk? The following factors may make you more likely to develop this condition:  Being female.  Having diabetes.  Being of older age.  Living with someone who has the fungus.  Walking barefoot in areas where the fungus thrives, such as showers or locker rooms.  Having poor circulation.  Wearing shoes and socks that cause your feet to sweat.  Having athlete's foot.  Having a nail injury or history of a recent nail surgery.  Having psoriasis.  Having a weak body defense system (immune system).  What are the signs or symptoms? Symptoms of this condition include:  A pale spot on the nail.  Thickening of the nail.  A nail that becomes yellow or brown.  A brittle or ragged nail edge.  A crumbling nail.  A nail that has lifted away from the nail bed.  How is this diagnosed? This condition is diagnosed with a physical exam. Your health care provider may take a scraping or clipping from your nail to test for the fungus. How is this treated? Mild infections do not need treatment. If you have significant nail changes, treatment may include:  Oral antifungal medicines. You may need to take the medicine for several weeks or several months, and you may not see the results for a God[place] time. These medicines can cause side effects. Ask your  health care provider what problems to watch for.  Antifungal nail polish and nail cream. These may be used along with oral antifungal medicines.  Laser treatment of the nail.  Surgery to remove the nail. This may be needed for the most severe infections.  Treatment takes a [place] time, and the infection may come back. Follow these instructions at home: Medicines  Take or apply over-the-counter and prescription medicines only as told by your health care provider.  Ask your health care provider about using over-the-counter mentholated ointment on your nails. Lifestyle   Do not share personal items, such as towels or nail clippers.  Trim your nails often.  Wash and dry your hands and feet every day.  Wear absorbent socks, and change your socks frequently.  Wear shoes that allow air to circulate, such as sandals or canvas tennis shoes. Throw out old shoes.  Wear rubber gloves if you are working with your hands in wet areas.  Do not walk barefoot in shower rooms or locker rooms.  Do not use a nail salon that does not use clean instruments.  Do not use artificial nails. General instructions  Keep all follow-up visits as told by your health care provider. This is important.  Use antifungal foot powder on your feet and in your shoes. Contact a health care provider if: Your infection is not getting better or it is getting worse after several months. This information is not intended to replace advice given to you by your health care  provider. Make sure you discuss any questions you have with your health care provider. Document Released: 06/05/2000 Document Revised: 11/14/2015 Document Reviewed: 12/10/2014 Elsevier Interactive Patient Education  2018 [employer].

## 2016-12-25 NOTE — Progress Notes (Signed)
Subjective:  I acted as a Education administrator for Dr. Charlett Blake. Beverly Pearson, Beverly Pearson  Patient ID: Beverly Pearson, female    DOB: [birth year], 25 y.o.   MRN: [id number]  No chief complaint on file.   HPI  Patient is in today for follow up. She feels well today but is frustrated with her inability to loose weight. She questions if her toenails are infected with fungus but on exam no concerns identified. No recent illness or hospitalizations. Denies CP/palp/SOB/HA/congestion/fevers/GI or GU c/o. Taking meds as prescribed  Patient Care Team: Mosie Lukes, MD as PCP - General (Family Medicine)   Past Medical History:  Diagnosis Date  . Anxiety 02/17/2011  . Constipation   . Depression with anxiety 03/03/2011  . GERD (gastroesophageal reflux disease)   . Herpes simplex without mention of complication   . Insomnia 09/01/2011  . Overweight 04/21/2015  . Reflux 02/17/2011  . Reflux 03/03/2011  . Reflux 09/16/2011  . Tobacco use 12/25/2016    Past Surgical History:  Procedure Laterality Date  . TONSILLECTOMY AND ADENOIDECTOMY     age 75    Family History  Problem Relation Age of Onset  . Colon cancer Neg Hx   . Diabetes Mother        type 2  . Hypertension Mother   . Diabetes Maternal Grandmother        type 2  . Cancer Maternal Grandmother 70       ovarian  . Hypertension Maternal Grandmother   . Kidney failure Maternal Grandfather   . Hypertension Maternal Grandfather   . Kidney disease Maternal Grandfather   . Diabetes Paternal Grandmother        type 2  . Hypertension Paternal Grandmother   . Emphysema Paternal Grandfather        smoke  . COPD Paternal Grandfather   . Cancer Paternal Grandfather 63       prostate cancer    Social History   Social History  . Marital status: Single    Spouse name: N/A  . Number of children: 0  . Years of education: N/A   Occupational History  . Unemployed    Social History Main Topics  . Smoking status: Current Some Day Smoker    Types: Cigars   Last attempt to quit: 07/17/2011  . Smokeless tobacco: Never Used     Comment: smokes 1 or 2 black and milds daily-  . Alcohol use Yes  . Drug use: Yes     Comment: marijuana-2 G or more daily  . Sexual activity: Yes    Birth control/ protection: Pill     Comment: seasonique   Other Topics Concern  . Not on file   Social History Narrative   Occasional soda     Outpatient Medications Prior to Visit  Medication Sig Dispense Refill  . Levonorgestrel-Ethinyl Estradiol (SEASONIQUE) 0.15-0.03 &0.01 MG tablet Take 1 tablet by mouth daily. 1 Package 11  . Multiple Vitamin (MULTIVITAMIN) tablet Take 1 tablet by mouth daily as needed.      . phentermine 37.5 MG capsule Take 1 capsule (37.5 mg total) by mouth every morning. 30 capsule 0  . ranitidine (ZANTAC) 300 MG tablet Take 1 tablet (300 mg total) by mouth at bedtime. 90 tablet 2   No facility-administered medications prior to visit.     No Known Allergies  Review of Systems  Constitutional: Negative for fever and malaise/fatigue.  HENT: Negative for congestion.   Eyes: Negative for blurred vision.  Respiratory: Negative for shortness of breath.   Cardiovascular: Negative for chest pain, palpitations and leg swelling.  Gastrointestinal: Negative for abdominal pain, blood in stool and nausea.  Genitourinary: Negative for dysuria and frequency.  Musculoskeletal: Negative for falls.  Skin: Negative for rash.  Neurological: Negative for dizziness, loss of consciousness and headaches.  Endo/Heme/Allergies: Negative for environmental allergies.  Psychiatric/Behavioral: Negative for depression. The patient is not nervous/anxious.        Objective:    Physical Exam  Constitutional: She is oriented to person, place, and time. She appears well-developed and well-nourished. No distress.  HENT:  Head: Normocephalic and atraumatic.  Nose: Nose normal.  Eyes: Right eye exhibits no discharge. Left eye exhibits no discharge.  Neck:  Normal range of motion. Neck supple.  Cardiovascular: Normal rate and regular rhythm.   No murmur heard. Pulmonary/Chest: Effort normal and breath sounds normal.  Abdominal: Soft. Bowel sounds are normal. There is no tenderness.  Musculoskeletal: She exhibits no edema.  Neurological: She is alert and oriented to person, place, and time.  Skin: Skin is warm and dry.  Psychiatric: She has a normal mood and affect.  Nursing note and vitals reviewed.   BP 118/72 (BP Location: Left Arm, Patient Position: Sitting, Cuff Size: Normal)   Pulse 85   Temp 98.3 F (36.8 C) (Oral)   Resp 18   Ht '5\' 6"'$  (1.676 m)   Wt 178 lb 12.8 oz (81.1 kg)   LMP 12/25/2016   SpO2 98%   BMI 28.86 kg/m  Wt Readings from Last 3 Encounters:  12/25/16 178 lb 12.8 oz (81.1 kg)  04/11/15 172 lb (78 kg)  03/08/13 157 lb (71.2 kg)   BP Readings from Last 3 Encounters:  12/25/16 118/72  04/11/15 124/88  03/08/13 (!) 148/74     Immunization History  Administered Date(s) Administered  . DTaP 01/15/1992, 03/09/1992, 05/03/1992, 02/14/1993, 12/01/1996  . Hepatitis A 03/26/2008  . Hepatitis B 12/14/1991, 01/15/1992, 08/06/1992  . HiB (PRP-OMP) 01/15/1992, 03/09/1992, 05/03/1992, 02/14/1993  . Influenza Nasal 03/26/2008, 05/26/2009  . Influenza Split 04/01/2011  . Influenza Whole 03/21/2013  . Influenza,inj,Quad PF,36+ Mos 04/11/2015  . Influenza-Unspecified 06/03/2005  . MMR 02/14/1993, 12/01/1996  . Meningococcal Conjugate 03/26/2008  . OPV 01/15/1992, 03/09/1992, 11/05/1992, 02/14/1993, 12/01/1996  . Tdap 09/24/2004  . Varicella 08/31/1995    Health Maintenance  Topic Date Due  . PAP SMEAR  11/03/2012  . TETANUS/TDAP  09/25/2014  . INFLUENZA VACCINE  01/20/2017  . HIV Screening  Completed    Lab Results  Component Value Date   WBC 8.5 04/11/2015   HGB 14.8 04/11/2015   HCT 44.9 04/11/2015   PLT 329.0 04/11/2015   GLUCOSE 72 04/11/2015   ALT 10 04/11/2015   AST 22 04/11/2015   NA 140  04/11/2015   K 4.0 04/11/2015   CL 106 04/11/2015   CREATININE 0.76 04/11/2015   BUN 10 04/11/2015   CO2 20 04/11/2015   TSH 1.47 04/11/2015    Lab Results  Component Value Date   TSH 1.47 04/11/2015   Lab Results  Component Value Date   WBC 8.5 04/11/2015   HGB 14.8 04/11/2015   HCT 44.9 04/11/2015   MCV 88.9 04/11/2015   PLT 329.0 04/11/2015   Lab Results  Component Value Date   NA 140 04/11/2015   K 4.0 04/11/2015   CO2 20 04/11/2015   GLUCOSE 72 04/11/2015   BUN 10 04/11/2015   CREATININE 0.76 04/11/2015   BILITOT 0.4 04/11/2015  ALKPHOS 48 04/11/2015   AST 22 04/11/2015   ALT 10 04/11/2015   PROT 7.7 04/11/2015   ALBUMIN 4.2 04/11/2015   CALCIUM 9.6 04/11/2015   GFR 120.83 04/11/2015   No results found for: CHOL No results found for: HDL No results found for: LDLCALC No results found for: TRIG No results found for: CHOLHDL No results found for: HGBA1C       Assessment & Plan:   Problem List Items Addressed This Visit    Overweight    Encouraged DASH diet, decrease po intake and increase exercise as tolerated. Needs 7-8 hours of sleep nightly. Avoid trans fats, eat small, frequent meals every 4-5 hours with lean proteins, complex carbs and healthy fats. Minimize simple carbs      Tobacco use    Encouraged complete cessation. Discussed need to quit as relates to risk of numerous cancers, cardiac and pulmonary disease as well as neurologic complications. Counseled for greater than 3 minutes. Discussed options to quit will try Chantix       Other Visit Diagnoses    Generalized abdominal pain       Relevant Medications   ranitidine (ZANTAC) 300 MG tablet      I have discontinued Ms. Simeone's phentermine. I have also changed her ranitidine. Additionally, I am having her start on varenicline and varenicline. Lastly, I am having her maintain her multivitamin and Levonorgestrel-Ethinyl Estradiol.  Meds ordered this encounter  Medications  .  varenicline (CHANTIX STARTING MONTH PAK) 0.5 MG X 11 & 1 MG X 42 tablet    Sig: Take one 0.5 mg tablet by mouth once daily for 3 days, then increase to one 0.5 mg tablet twice daily for 4 days, then increase to one 1 mg tablet twice daily.    Dispense:  53 tablet    Refill:  0  . varenicline (CHANTIX CONTINUING MONTH PAK) 1 MG tablet    Sig: Take 1 tablet (1 mg total) by mouth 2 (two) times daily.    Dispense:  60 tablet    Refill:  4  . ranitidine (ZANTAC) 300 MG tablet    Sig: Take 1 tablet (300 mg total) by mouth daily as needed for heartburn.    Dispense:  30 tablet    Refill:  2    CMA served as scribe during this visit. History, Physical and Plan performed by medical provider. Documentation and orders reviewed and attested to.  Penni Homans, MD

## 2016-12-25 NOTE — Assessment & Plan Note (Signed)
Was doing better with improved diet but has lapsed some and the heart burn has returned.. Ranitidine prn is given

## 2016-12-27 NOTE — Assessment & Plan Note (Signed)
Encouraged DASH diet, decrease po intake and increase exercise as tolerated. Needs 7-8 hours of sleep nightly. Avoid trans fats, eat small, frequent meals every 4-5 hours with lean proteins, complex carbs and healthy fats. Minimize simple carbs 

## 2017-01-20 ENCOUNTER — Encounter (HOSPITAL_BASED_OUTPATIENT_CLINIC_OR_DEPARTMENT_OTHER): Payer: Self-pay

## 2017-01-20 ENCOUNTER — Emergency Department (HOSPITAL_BASED_OUTPATIENT_CLINIC_OR_DEPARTMENT_OTHER)
Admission: EM | Admit: 2017-01-20 | Discharge: 2017-01-20 | Disposition: A | Discharge status: Home / Self Care | Payer: BLUE CROSS/BLUE SHIELD | Attending: Physician Assistant | Admitting: Physician Assistant

## 2017-01-20 DIAGNOSIS — F17200 Nicotine dependence, unspecified, uncomplicated: Secondary | ICD-10-CM | POA: Diagnosis not present

## 2017-01-20 DIAGNOSIS — W260XXA Contact with knife, initial encounter: Secondary | ICD-10-CM | POA: Diagnosis not present

## 2017-01-20 DIAGNOSIS — Y999 Unspecified external cause status: Secondary | ICD-10-CM | POA: Diagnosis not present

## 2017-01-20 DIAGNOSIS — Y929 Unspecified place or not applicable: Secondary | ICD-10-CM | POA: Diagnosis not present

## 2017-01-20 DIAGNOSIS — Y939 Activity, unspecified: Secondary | ICD-10-CM | POA: Diagnosis not present

## 2017-01-20 DIAGNOSIS — S61412A Laceration without foreign body of left hand, initial encounter: Secondary | ICD-10-CM | POA: Insufficient documentation

## 2017-01-20 DIAGNOSIS — S6992XA Unspecified injury of left wrist, hand and finger(s), initial encounter: Secondary | ICD-10-CM | POA: Diagnosis present

## 2017-01-20 DIAGNOSIS — Z79899 Other long term (current) drug therapy: Secondary | ICD-10-CM | POA: Insufficient documentation

## 2017-01-20 MED ORDER — TRAMADOL HCL 50 MG PO TABS: 50.0000 mg | ORAL_TABLET | Freq: Four times a day (QID) | ORAL | 0 refills | Status: DC | PRN

## 2017-01-20 MED ORDER — LIDOCAINE HCL (PF) 1 % IJ SOLN
INTRAMUSCULAR | Status: AC
  Administered 2017-01-20: 10 mL
  Filled 2017-01-20: qty 10

## 2017-01-20 NOTE — ED Notes (Signed)
Suture cart at bedside 

## 2017-01-20 NOTE — Discharge Instructions (Signed)
Follow up with your doctor, an urgent care, or this Emergency Department for removal of your stitches in 7 days. Do not submerge the stitches in water for the first 24 hours. Take your pain medication as prescribed. Do not operate heavy machinery while on pain medication.  ° °If you were given pain medication that contains acetaminophen (Tylenol) such as Vicodin or Percocet, it is not reccommended that you use additional acetaminophen (Tylenol) while taking this medication.  ° °TREATMENT  °Keep the wound clean and dry for the next 24 hours and leave the dressing in place. You may shower after 24 hours. Do not soak the area for Dutter periods of times as in a bath until the sutures are removed. °After 24 hours you may remove the dressing and gently clean the laceration site with antibacterial soap and warm water 2 times a day. Pat dry with clean towel. Do not scrub. °Once the wound has healed, scarring can be minimized by covering the wound with sunscreen during the day for 1 full year. ° °SEEK MEDICAL CARE IF:  °You have redness, swelling, or increasing pain in the wound.  °You see a red line that goes away from the wound.  °You have yellowish-white fluid (pus) coming from the wound.  °You have a fever.  °You notice a bad smell coming from the wound or dressing.  °Your wound breaks open before or after sutures have been removed.  °You notice something coming out of the wound such as wood or glass.  °Your wound is on your hand or foot and you cannot move a finger or toe.  °Your pain is not controlled with prescribed medicine.  ° °If you did not receive a tetanus shot today because you thought you were up to date, but did not recall when your last one was given, make sure to check with your primary caregiver to determine if you need one. ° ° ° °

## 2017-01-20 NOTE — ED Notes (Signed)
ED Provider at bedside. 

## 2017-01-20 NOTE — ED Provider Notes (Signed)
[facility] Provider Note   CSN: [id number] Arrival date & time: 01/20/17  1839     History   Chief Complaint Chief Complaint  Patient presents with  . Extremity Laceration    HPI Beverly Pearson is a 26 y.o. female who presents to the emergency department for laceration at the first webspace of the left hand that occurred at approximately 1830 today. Patient states that she was attempting to coordinate tomatoes when making tacos when the knife slipped and she cut the first webspace of her left hand. The bleeding is controlled. She has no numbness or tingling distally to this. Patient states that she has had a tetanus shot in the last 5 years.  HPI  Past Medical History:  Diagnosis Date  . Anxiety 02/17/2011  . Constipation   . Depression with anxiety 03/03/2011  . GERD (gastroesophageal reflux disease)   . Herpes simplex without mention of complication   . Insomnia 09/01/2011  . Overweight 04/21/2015  . Reflux 02/17/2011  . Reflux 03/03/2011  . Reflux 09/16/2011  . Tobacco use 12/25/2016    Patient Active Problem List   Diagnosis Date Noted  . Tobacco use 12/25/2016  . Overweight 04/21/2015  . Vaginitis and vulvovaginitis 04/21/2015  . Reflux 09/16/2011  . Insomnia 09/01/2011  . Depression with anxiety 03/03/2011  . Constipation 02/17/2011    Past Surgical History:  Procedure Laterality Date  . TONSILLECTOMY AND ADENOIDECTOMY     age 26    OB History    Gravida Para Term Preterm AB Living   0             SAB TAB Ectopic Multiple Live Births                   Home Medications    Prior to Admission medications   Medication Sig Start Date End Date Taking? Authorizing Provider  Levonorgestrel-Ethinyl Estradiol (SEASONIQUE) 0.15-0.03 &0.01 MG tablet Take 1 tablet by mouth daily. 07/25/12   Henreitta LeberPowell, [place], PA-C  Multiple Vitamin (MULTIVITAMIN) tablet Take 1 tablet by mouth daily as needed.      [provider]  ranitidine (ZANTAC) 300 MG tablet  Take 1 tablet (300 mg total) by mouth daily as needed for heartburn. 12/25/16   Bradd CanaryBlyth, Stacey A, MD  traMADol (ULTRAM) 50 MG tablet Take 1 tablet (50 mg total) by mouth every 6 (six) hours as needed. 01/20/17   Cameryn Chrisley, Elmer SowMichael M, PA-C  varenicline (CHANTIX CONTINUING MONTH PAK) 1 MG tablet Take 1 tablet (1 mg total) by mouth 2 (two) times daily. 12/25/16   Bradd CanaryBlyth, Stacey A, MD  varenicline (CHANTIX STARTING MONTH PAK) 0.5 MG X 11 & 1 MG X 42 tablet Take one 0.5 mg tablet by mouth once daily for 3 days, then increase to one 0.5 mg tablet twice daily for 4 days, then increase to one 1 mg tablet twice daily. 12/25/16   Bradd CanaryBlyth, Stacey A, MD    Family History Family History  Problem Relation Age of Onset  . Diabetes Mother        type 2  . Hypertension Mother   . Diabetes Maternal Grandmother        type 2  . Cancer Maternal Grandmother 70       ovarian  . Hypertension Maternal Grandmother   . Kidney failure Maternal Grandfather   . Hypertension Maternal Grandfather   . Kidney disease Maternal Grandfather   . Diabetes Paternal Grandmother  type 2  . Hypertension Paternal Grandmother   . Emphysema Paternal Grandfather        smoke  . COPD Paternal Grandfather   . Cancer Paternal Grandfather [phone]       prostate cancer  . Colon cancer Neg Hx     Social History Social History  Substance Use Topics  . Smoking status: Current Every Day Smoker  . Smokeless tobacco: Never Used     Comment: smokes 1 or 2 black and milds daily-  . Alcohol use Yes     Comment: occ     Allergies   Patient has no known allergies.   Review of Systems Review of Systems  Constitutional: Negative for fever.  Skin: Positive for wound.  Neurological: Negative for weakness and numbness.     Physical Exam Updated Vital Signs BP (!) 138/93 (BP Location: Right Arm)   Pulse 94   Temp 98.5 F (36.9 C) (Oral)   Resp 18   Ht 5\' 6"  (1.676 m)   Wt 82.1 kg (181 lb)   LMP 12/25/2016   SpO2 100%   BMI 29.21  kg/m   Physical Exam  Constitutional: She appears well-developed and well-nourished.  HENT:  Head: Normocephalic and atraumatic.  Right Ear: External ear normal.  Left Ear: External ear normal.  Eyes: Conjunctivae are normal. Right eye exhibits no discharge. Left eye exhibits no discharge. No scleral icterus.  Cardiovascular:  Pulses:      Radial pulses are 2+ on the right side, and 2+ on the left side.  Pulmonary/Chest: Effort normal. No respiratory distress.  Musculoskeletal:       Left wrist: Normal.  Left hand: Fingers appear normal. Radial/Ulnar arteries 2+ with <2sec cap refill. SILT in M/U/R distributions. Dynamic 2-pt discrimination intact over median and ulnar nerve distributions on the ulnar/radial aspects of digits. Sharp/dull sensation intact at dorsal 1st webspace surrounding laceration. Grip 5/5 strength. Finger adduction/abduction intact with 5/5 strength.  Thumb opposition intact. Full ROM to Flexion/Extension at wrist, MCP, PIP and DIP.   Neurological: She is alert. No sensory deficit.  Skin: Skin is warm and dry. Capillary refill takes less than 2 seconds. Laceration noted. No erythema. No pallor.  3 cm laceration noted to the first webspace of the left hand. There is no tendon or vessel exposure. Bleeding is controlled. No surrounding erythema.  Psychiatric: She has a normal mood and affect.  Nursing note and vitals reviewed.    ED Treatments / Results  Labs (all labs ordered are listed, but only abnormal results are displayed) Labs Reviewed - No data to display  EKG  EKG Interpretation None       Radiology No results found.  Procedures .Marland Kitchen.Laceration Repair Date/Time: 01/20/2017 10:30 PM Performed by: Jacinto HalimMACZIS, Kirby Cortese M Authorized by: Jacinto HalimMACZIS, Jayleen Afonso M   Consent:    Consent obtained:  Verbal   Consent given by:  Patient   Risks discussed:  Infection, need for additional repair, nerve damage, poor wound healing, poor cosmetic result, retained foreign  body, pain, tendon damage and vascular damage   Alternatives discussed:  No treatment Anesthesia (see MAR for exact dosages):    Anesthesia method:  Local infiltration   Local anesthetic:  Lidocaine 1% w/o epi Laceration details:    Location:  Hand   Hand location: left hand, 1st web space.   Length (cm):  3 Repair type:    Repair type:  Simple Pre-procedure details:    Preparation:  Patient was prepped and draped in usual  sterile fashion Exploration:    Hemostasis achieved with:  Direct pressure   Contaminated: no   Treatment:    Area cleansed with:  Saline and Betadine   Amount of cleaning:  Standard   Irrigation solution:  Sterile water and sterile saline   Irrigation volume:    Irrigation method:  Syringe   Visualized foreign bodies/material removed: no   Skin repair:    Repair method:  Sutures   Suture size:  5-0   Suture material:  Prolene   Suture technique:  Simple interrupted   Number of sutures:  4 Approximation:    Approximation:  Close Post-procedure details:    Dressing:  Non-adherent dressing   Patient tolerance of procedure:  Tolerated well, no immediate complications   (including critical care time)  Medications Ordered in ED Medications  lidocaine (PF) (XYLOCAINE) 1 % injection (10 mLs  Given by Other 01/20/17 2210)     Initial Impression / Assessment and Plan / ED Course  I have reviewed the triage vital signs and the nursing notes.  Pertinent labs & imaging results that were available during my care of the patient were reviewed by me and considered in my medical decision making (see chart for details).     26 year old female with laceration to the first webspace of the left hand. No evidence of tendon injury on exam. Pressure irrigation performed. Wound explored and base of wound visualized in a bloodless field without evidence of foreign body.  Laceration occurred < 8 hours prior to repair which was well tolerated. Tdap is up-to-date.  Pt has  no comorbidities to effect normal wound healing. Pt discharged without antibiotics.  Discussed suture home care with patient and answered questions. Pt to follow-up for wound check and suture removal in 7 days; they are to return to the ED sooner for signs of infection. Pt is hemodynamically stable with no complaints prior to dc. Discussed this case with Dr. Corlis Leak who is in agreement with plans.   Final Clinical Impressions(s) / ED Diagnoses   Final diagnoses:  Laceration of left hand without foreign body, initial encounter    New Prescriptions Discharge Medication List as of 01/20/2017 10:33 PM       Jacinto Halim, PA-C 01/21/17 0211    Corlis Leak, Cindee Salt, MD 01/22/17 0000

## 2017-01-20 NOTE — ED Triage Notes (Signed)
Pt states she cut left hand web with kitchen knife 5 min PTA-lac noted-no bleeding-pt crying

## 2017-01-20 NOTE — ED Notes (Signed)
Pt cut her hand while trying to remove the core.  Cut to left hand between her index and thumb.

## 2017-02-01 ENCOUNTER — Ambulatory Visit (HOSPITAL_COMMUNITY)
Admission: EM | Admit: 2017-02-01 | Discharge: 2017-02-01 | Disposition: A | Discharge status: Home / Self Care | Payer: BLUE CROSS/BLUE SHIELD

## 2017-02-01 ENCOUNTER — Encounter (HOSPITAL_COMMUNITY): Payer: Self-pay | Admitting: *Deleted

## 2017-02-01 DIAGNOSIS — Z4802 Encounter for removal of sutures: Secondary | ICD-10-CM | POA: Diagnosis not present

## 2017-02-01 NOTE — ED Notes (Signed)
4 sutures removed. Laceration not healed completely. 1/4 steri strip placed. Patient given education on how to change at home. Bill NP consulted on healing of laceration. No further recommendations.

## 2017-02-01 NOTE — ED Triage Notes (Signed)
Patient had sutures placed to left hand, inner thumb area, on 8/1 in ED. No signs of infection noted.

## 2017-04-30 ENCOUNTER — Telehealth: Payer: Self-pay | Admitting: Family Medicine

## 2017-04-30 NOTE — Telephone Encounter (Signed)
Relation to pt: Eliasen,Sheron  (mother) Call back number: [phone]   Reason for call:  Mother requesting referral to Behavior Health due to patient experiencing depression, please advise

## 2017-05-04 NOTE — Telephone Encounter (Signed)
Please advise 

## 2017-05-04 NOTE — Telephone Encounter (Signed)
[facility] with [facility]

## 2017-05-04 NOTE — Telephone Encounter (Signed)
they need to call [facility] and [facility] numbers to set up appt and have her be seen in ER or here if it is bad. They actually expect the patient to call

## 2017-05-31 ENCOUNTER — Ambulatory Visit: Payer: BLUE CROSS/BLUE SHIELD | Admitting: Psychology

## 2017-06-04 ENCOUNTER — Encounter: Payer: Self-pay | Admitting: Family Medicine

## 2017-06-08 ENCOUNTER — Telehealth: Payer: Self-pay
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
## 2017-06-08 NOTE — Telephone Encounter (Signed)
Referral already placed in computer

## 2017-06-08 NOTE — Telephone Encounter (Signed)
See previous 2 notes

## 2017-06-08 NOTE — Telephone Encounter (Signed)
I would be happy to see her if she will come in. And if you could call behavioral health and let them know Dr Abner GreenspanBlyth hopes to get her in fairly soon. Let me know who to call if they say I have to call myself.

## 2017-06-09 NOTE — Telephone Encounter (Signed)
Patient states she will come in for the 4:15 appointment tomorrow

## 2017-06-09 NOTE — Telephone Encounter (Signed)
tmorrow is packed so I will get behind, put her in at 4:15 and warn her we will likely be behind

## 2017-06-09 NOTE — Telephone Encounter (Signed)
Dr. Abner GreenspanBlyth. The next availability with [facility] is not until January. Called [facility] who is booked out until February.  You have openings that are blocked tomorrow at 11:15 and 11:30 just wanted to know if you could see her tomorrow. Please advise.

## 2017-06-09 NOTE — Telephone Encounter (Signed)
Thanks

## 2017-06-10 ENCOUNTER — Encounter: Payer: Self-pay | Admitting: Family Medicine

## 2017-06-10 ENCOUNTER — Ambulatory Visit: Payer: BLUE CROSS/BLUE SHIELD | Admitting: Family Medicine

## 2017-06-10 VITALS — BP 150/102 | HR 91 | Temp 98.7°F | Resp 18 | Wt 197.0 lb

## 2017-06-10 DIAGNOSIS — I10 Essential (primary) hypertension: Secondary | ICD-10-CM

## 2017-06-10 DIAGNOSIS — F418 Other specified anxiety disorders: Secondary | ICD-10-CM

## 2017-06-10 DIAGNOSIS — R1084 Generalized abdominal pain: Secondary | ICD-10-CM

## 2017-06-10 MED ORDER — LORAZEPAM 0.5 MG PO TABS: 0.5000 mg | ORAL_TABLET | Freq: Two times a day (BID) | ORAL | 1 refills | Status: DC | PRN

## 2017-06-10 MED ORDER — METOPROLOL TARTRATE 25 MG PO TABS: 25.0000 mg | ORAL_TABLET | Freq: Two times a day (BID) | ORAL | 3 refills | Status: DC

## 2017-06-10 MED ORDER — NICOTINE 14 MG/24HR TD PT24: 14.0000 mg | MEDICATED_PATCH | Freq: Every day | TRANSDERMAL | 1 refills | Status: DC

## 2017-06-10 MED ORDER — RANITIDINE HCL 300 MG PO TABS: 300.0000 mg | ORAL_TABLET | Freq: Every day | ORAL | 2 refills | Status: DC | PRN

## 2017-06-10 MED ORDER — NICOTINE 21 MG/24HR TD PT24: 21.0000 mg | MEDICATED_PATCH | Freq: Every day | TRANSDERMAL | 1 refills | Status: DC

## 2017-06-10 MED ORDER — NICOTINE 7 MG/24HR TD PT24: 7.0000 mg | MEDICATED_PATCH | Freq: Every day | TRANSDERMAL | 1 refills | Status: DC

## 2017-06-10 MED ORDER — PAROXETINE HCL 20 MG PO TABS: 20.0000 mg | ORAL_TABLET | Freq: Every day | ORAL | 3 refills | Status: DC

## 2017-06-10 NOTE — Progress Notes (Signed)
Subjective:  I acted as a Education administrator for Dr. Charlett Blake. [place], [place]  Patient ID: Beverly Pearson, female    DOB: 10-Nov-1990, 26 y.o.   MRN: [id number]  No chief complaint on file.   HPI  Patient is in today for a depression follow up and she reports she is doing poorly. She left a job with [employer] in November. Since then she has not been able to find another job and is feeling hopeless and depressed. Endorses anhedonia and even fleeting suicidal thoughts but no active plan. Also has episodes of anxiety attacks with palpitations and SOB when stress overwhelms her. Is willing to restart medications and counseling. Denies CP/palp/SOB/HA/congestion/fevers/GI or GU c/o. Taking meds as prescribed  Patient Care Team: Mosie Lukes, MD as PCP - General (Family Medicine)   Past Medical History:  Diagnosis Date  . Anxiety 02/17/2011  . Constipation   . Depression with anxiety 03/03/2011  . GERD (gastroesophageal reflux disease)   . Herpes simplex without mention of complication   . Insomnia 09/01/2011  . Overweight 04/21/2015  . Reflux 02/17/2011  . Reflux 03/03/2011  . Reflux 09/16/2011  . Tobacco use 12/25/2016    Past Surgical History:  Procedure Laterality Date  . TONSILLECTOMY AND ADENOIDECTOMY     age 31    Family History  Problem Relation Age of Onset  . Diabetes Mother        type 2  . Hypertension Mother   . Diabetes Maternal Grandmother        type 2  . Cancer Maternal Grandmother 70       ovarian  . Hypertension Maternal Grandmother   . Kidney failure Maternal Grandfather   . Hypertension Maternal Grandfather   . Kidney disease Maternal Grandfather   . Diabetes Paternal Grandmother        type 2  . Hypertension Paternal Grandmother   . Emphysema Paternal Grandfather        smoke  . COPD Paternal Grandfather   . Cancer Paternal Grandfather 5       prostate cancer  . Colon cancer Neg Hx     Social History   Socioeconomic History  . Marital status:  Single    Spouse name: Not on file  . Number of children: 0  . Years of education: Not on file  . Highest education level: Not on file  Social Needs  . Financial resource strain: Not on file  . Food insecurity - worry: Not on file  . Food insecurity - inability: Not on file  . Transportation needs - medical: Not on file  . Transportation needs - non-medical: Not on file  Occupational History  . Occupation: Unemployed  Tobacco Use  . Smoking status: Current Every Day Smoker  . Smokeless tobacco: Never Used  . Tobacco comment: smokes 1 or 2 black and milds daily-  Substance and Sexual Activity  . Alcohol use: Yes    Comment: occ  . Drug use: Yes    Types: Marijuana  . Sexual activity: Not on file  Other Topics Concern  . Not on file  Social History Narrative   Occasional soda     Outpatient Medications Prior to Visit  Medication Sig Dispense Refill  . Levonorgestrel-Ethinyl Estradiol (SEASONIQUE) 0.15-0.03 &0.01 MG tablet Take 1 tablet by mouth daily. 1 Package 11  . Multiple Vitamin (MULTIVITAMIN) tablet Take 1 tablet by mouth daily as needed.      . ranitidine (ZANTAC) 300 MG tablet Take  1 tablet (300 mg total) by mouth daily as needed for heartburn. 30 tablet 2  . traMADol (ULTRAM) 50 MG tablet Take 1 tablet (50 mg total) by mouth every 6 (six) hours as needed. 6 tablet 0  . varenicline (CHANTIX CONTINUING MONTH PAK) 1 MG tablet Take 1 tablet (1 mg total) by mouth 2 (two) times daily. 60 tablet 4  . varenicline (CHANTIX STARTING MONTH PAK) 0.5 MG X 11 & 1 MG X 42 tablet Take one 0.5 mg tablet by mouth once daily for 3 days, then increase to one 0.5 mg tablet twice daily for 4 days, then increase to one 1 mg tablet twice daily. 53 tablet 0   No facility-administered medications prior to visit.     No Known Allergies  Review of Systems  Constitutional: Negative for fever and malaise/fatigue.  HENT: Negative for congestion.   Eyes: Negative for blurred vision.    Respiratory: Negative for shortness of breath.   Cardiovascular: Negative for chest pain, palpitations and leg swelling.  Gastrointestinal: Negative for abdominal pain, blood in stool and nausea.  Genitourinary: Negative for dysuria and frequency.  Musculoskeletal: Negative for falls.  Skin: Negative for rash.  Neurological: Negative for dizziness, loss of consciousness and headaches.  Endo/Heme/Allergies: Negative for environmental allergies.  Psychiatric/Behavioral: Positive for depression. Negative for hallucinations and substance abuse. The patient is not nervous/anxious.        Objective:    Physical Exam  Constitutional: She is oriented to person, place, and time. She appears well-developed and well-nourished. No distress.  HENT:  Head: Normocephalic and atraumatic.  Nose: Nose normal.  Eyes: Right eye exhibits no discharge. Left eye exhibits no discharge.  Neck: Normal range of motion. Neck supple.  Cardiovascular: Normal rate and regular rhythm.  No murmur heard. Pulmonary/Chest: Effort normal and breath sounds normal.  Abdominal: Soft. Bowel sounds are normal. There is no tenderness.  Musculoskeletal: She exhibits no edema.  Neurological: She is alert and oriented to person, place, and time.  Skin: Skin is warm and dry.  Psychiatric: She has a normal mood and affect.  Nursing note and vitals reviewed.   BP (!) 150/102 (BP Location: Left Arm, Patient Position: Sitting, Cuff Size: Normal)   Pulse 91   Temp 98.7 F (37.1 C) (Oral)   Resp 18   Wt 197 lb (89.4 kg)   LMP 05/04/2017   SpO2 98%   BMI 31.80 kg/m  Wt Readings from Last 3 Encounters:  06/10/17 197 lb (89.4 kg)  01/20/17 181 lb (82.1 kg)  12/25/16 178 lb 12.8 oz (81.1 kg)   BP Readings from Last 3 Encounters:  06/10/17 (!) 150/102  02/01/17 (!) 154/106  01/20/17 (!) 138/93     Immunization History  Administered Date(s) Administered  . DTaP 01/15/1992, 03/09/1992, 05/03/1992, 02/14/1993,  12/01/1996  . Hepatitis A 03/26/2008  . Hepatitis B 12/14/1991, 01/15/1992, 08/06/1992  . HiB (PRP-OMP) 01/15/1992, 03/09/1992, 05/03/1992, 02/14/1993  . Influenza Nasal 03/26/2008, 05/26/2009  . Influenza Split 04/01/2011  . Influenza Whole 03/21/2013  . Influenza,inj,Quad PF,6+ Mos 04/11/2015  . Influenza-Unspecified 06/03/2005  . MMR 02/14/1993, 12/01/1996  . Meningococcal Conjugate 03/26/2008  . OPV 01/15/1992, 03/09/1992, 11/05/1992, 02/14/1993, 12/01/1996  . Tdap 09/24/2004  . Varicella 08/31/1995    Health Maintenance  Topic Date Due  . PAP SMEAR  11/03/2012  . TETANUS/TDAP  09/25/2014  . INFLUENZA VACCINE  02/10/2018 (Originally 01/20/2017)  . HIV Screening  Completed    Lab Results  Component Value Date   WBC  11.6 (H) 06/10/2017   HGB 14.9 06/10/2017   HCT 45.7 06/10/2017   PLT 386.0 06/10/2017   GLUCOSE 78 06/10/2017   ALT 23 06/10/2017   AST 22 06/10/2017   NA 138 06/10/2017   K 4.2 06/10/2017   CL 104 06/10/2017   CREATININE 0.73 06/10/2017   BUN 10 06/10/2017   CO2 28 06/10/2017   TSH 5.35 (H) 06/10/2017    Lab Results  Component Value Date   TSH 5.35 (H) 06/10/2017   Lab Results  Component Value Date   WBC 11.6 (H) 06/10/2017   HGB 14.9 06/10/2017   HCT 45.7 06/10/2017   MCV 88.3 06/10/2017   PLT 386.0 06/10/2017   Lab Results  Component Value Date   NA 138 06/10/2017   K 4.2 06/10/2017   CO2 28 06/10/2017   GLUCOSE 78 06/10/2017   BUN 10 06/10/2017   CREATININE 0.73 06/10/2017   BILITOT 0.4 06/10/2017   ALKPHOS 58 06/10/2017   AST 22 06/10/2017   ALT 23 06/10/2017   PROT 7.8 06/10/2017   ALBUMIN 4.3 06/10/2017   CALCIUM 9.3 06/10/2017   GFR 124.32 06/10/2017   No results found for: CHOL No results found for: HDL No results found for: LDLCALC No results found for: TRIG No results found for: CHOLHDL No results found for: HGBA1C       Assessment & Plan:   Problem List Items Addressed This Visit    Depression with anxiety     Is struggling with worsening depression after leaving a job at [employer] in [place], is feeling hopeless and depressed. She agrees to restart medications and counseling. She agrees to seek [facility] at [facility] if she becomes a risk to herself. She is started on Paroxetine 20 mg daily and report any concerns. Counseled for 30 minutes of a 35 minute appointment.       Relevant Medications   PARoxetine (PAXIL) 20 MG tablet   LORazepam (ATIVAN) 0.5 MG tablet    Other Visit Diagnoses    Essential hypertension    -  Primary   Relevant Medications   metoprolol tartrate (LOPRESSOR) 25 MG tablet   Other Relevant Orders   TSH (Completed)   Comprehensive metabolic panel (Completed)   CBC (Completed)   Generalized abdominal pain       Relevant Medications   ranitidine (ZANTAC) 300 MG tablet      I have discontinued Beverly Pearson's varenicline, varenicline, and traMADol. I am also having her start on PARoxetine, LORazepam, metoprolol tartrate, nicotine, nicotine, and nicotine. Additionally, I am having her maintain her multivitamin, Levonorgestrel-Ethinyl Estradiol, and ranitidine.  Meds ordered this encounter  Medications  . ranitidine (ZANTAC) 300 MG tablet    Sig: Take 1 tablet (300 mg total) by mouth daily as needed for heartburn.    Dispense:  30 tablet    Refill:  2  . PARoxetine (PAXIL) 20 MG tablet    Sig: Take 1 tablet (20 mg total) by mouth daily.    Dispense:  30 tablet    Refill:  3  . LORazepam (ATIVAN) 0.5 MG tablet    Sig: Take 1 tablet (0.5 mg total) by mouth 2 (two) times daily as needed for anxiety.    Dispense:  30 tablet    Refill:  1  . metoprolol tartrate (LOPRESSOR) 25 MG tablet    Sig: Take 1 tablet (25 mg total) by mouth 2 (two) times daily.    Dispense:  60 tablet  Refill:  3  . nicotine (NICODERM CQ - DOSED IN MG/24 HOURS) 21 mg/24hr patch    Sig: Place 1 patch (21 mg total) onto the skin daily.    Dispense:  28 patch    Refill:  1  . nicotine  (NICODERM CQ - DOSED IN MG/24 HOURS) 14 mg/24hr patch    Sig: Place 1 patch (14 mg total) onto the skin daily. Start after 21 mg patches done    Dispense:  28 patch    Refill:  1  . nicotine (NICODERM CQ - DOSED IN MG/24 HR) 7 mg/24hr patch    Sig: Place 1 patch (7 mg total) onto the skin daily. Start after 14 mg patches complete    Dispense:  28 patch    Refill:  1    CMA served as Education administrator during this visit. History, Physical and Plan performed by medical provider. Documentation and orders reviewed and attested to.  Penni Homans, MD

## 2017-06-10 NOTE — Patient Instructions (Addendum)
Call Terri for first available appointment [facility] Start the Paxil 20 mg tabs, 1/2 tab daily for 1 to 2 weeks then increase to a full tab as tolerated Living With Anxiety After being diagnosed with an anxiety disorder, you may be relieved to know why you have felt or behaved a certain way. It is natural to also feel overwhelmed about the treatment ahead and what it will mean for your life. With care and support, you can manage this condition and recover from it. How to cope with anxiety Dealing with stress Stress is your body's reaction to life changes and events, both good and bad. Stress can last just a few hours or it can be ongoing. Stress can play a major role in anxiety, so it is important to learn both how to cope with stress and how to think about it differently. Talk with your health care provider or a counselor to learn more about stress reduction. He or she may suggest some stress reduction techniques, such as:  Music therapy. This can include creating or listening to music that you enjoy and that inspires you.  Mindfulness-based meditation. This involves being aware of your normal breaths, rather than trying to control your breathing. It can be done while sitting or walking.  Centering prayer. This is a kind of meditation that involves focusing on a word, phrase, or sacred image that is meaningful to you and that brings you peace.  Deep breathing. To do this, expand your stomach and inhale slowly through your nose. Hold your breath for 3-5 seconds. Then exhale slowly, allowing your stomach muscles to relax.  Self-talk. This is a skill where you identify thought patterns that lead to anxiety reactions and correct those thoughts.  Muscle relaxation. This involves tensing muscles then relaxing them.  Choose a stress reduction technique that fits your lifestyle and personality. Stress reduction techniques take time and practice. Set aside 5-15 minutes a day to do  them. Therapists can offer training in these techniques. The training may be covered by some insurance plans. Other things you can do to manage stress include:  Keeping a stress diary. This can help you learn what triggers your stress and ways to control your response.  Thinking about how you respond to certain situations. You may not be able to control everything, but you can control your reaction.  Making time for activities that help you relax, and not feeling guilty about spending your time in this way.  Therapy combined with coping and stress-reduction skills provides the best chance for successful treatment. Medicines Medicines can help ease symptoms. Medicines for anxiety include:  Anti-anxiety drugs.  Antidepressants.  Beta-blockers.  Medicines may be used as the main treatment for anxiety disorder, along with therapy, or if other treatments are not working. Medicines should be prescribed by a health care provider. Relationships Relationships can play a big part in helping you recover. Try to spend more time connecting with trusted friends and family members. Consider going to couples counseling, taking family education classes, or going to family therapy. Therapy can help you and others better understand the condition. How to recognize changes in your condition Everyone has a different response to treatment for anxiety. Recovery from anxiety happens when symptoms decrease and stop interfering with your daily activities at home or work. This may mean that you will start to:  Have better concentration and focus.  Sleep better.  Be less irritable.  Have more energy.  Have improved memory.  It  is important to recognize when your condition is getting worse. Contact your health care provider if your symptoms interfere with home or work and you do not feel like your condition is improving. Where to find help and support: You can get help and support from these  sources:  Self-help groups.  Online and [employer].  A trusted spiritual leader.  Couples counseling.  Family education classes.  Family therapy.  Follow these instructions at home:  Eat a healthy diet that includes plenty of vegetables, fruits, whole grains, low-fat dairy products, and lean protein. Do not eat a lot of foods that are high in solid fats, added sugars, or salt.  Exercise. Most adults should do the following: ? Exercise for at least 150 minutes each week. The exercise should increase your heart rate and make you sweat (moderate-intensity exercise). ? Strengthening exercises at least twice a week.  Cut down on caffeine, tobacco, alcohol, and other potentially harmful substances.  Get the right amount and quality of sleep. Most adults need 7-9 hours of sleep each night.  Make choices that simplify your life.  Take over-the-counter and prescription medicines only as told by your health care provider.  Avoid caffeine, alcohol, and certain over-the-counter cold medicines. These may make you feel worse. Ask your pharmacist which medicines to avoid.  Keep all follow-up visits as told by your health care provider. This is important. Questions to ask your health care provider  Would I benefit from therapy?  How often should I follow up with a health care provider?  How [place] do I need to take medicine?  Are there any [place]er[place]-term side effects of my medicine?  Are there any alternatives to taking medicine? Contact a health care provider if:  You have a hard time staying focused or finishing daily tasks.  You spend many hours a day feeling worried about everyday life.  You become exhausted by worry.  You start to have headaches, feel tense, or have nausea.  You urinate more than normal.  You have diarrhea. Get help right away if:  You have a racing heart and shortness of breath.  You have thoughts of hurting yourself or others. If you ever  feel like you may hurt yourself or others, or have thoughts about taking your own life, get help right away. You can go to your nearest emergency department or call:  Your local emergency services (911 in the [place]  A suicide crisis helpline, such as the [facility] at (667)320-4321. This is open 24-hours a day.  Summary  Taking steps to deal with stress can help calm you.  Medicines cannot cure anxiety disorders, but they can help ease symptoms.  Family, friends, and partners can play a big part in helping you recover from an anxiety disorder. This information is not intended to replace advice given to you by your health care provider. Make sure you discuss any questions you have with your health care provider. Document Released: 06/02/2016 Document Revised: 06/02/2016 Document Reviewed: 06/02/2016 Elsevier Interactive Patient Education  [employer].

## 2017-06-11 LAB — CBC
HCT: 45.7 % (ref 36.0–46.0)
Hemoglobin: 14.9 g/dL (ref 12.0–15.0)
MCHC: 32.6 g/dL (ref 30.0–36.0)
MCV: 88.3 fl (ref 78.0–100.0)
PLATELETS: 386 10*3/uL (ref 150.0–400.0)
RBC: 5.18 Mil/uL — ABNORMAL HIGH (ref 3.87–5.11)
RDW: 14.1 % (ref 11.5–15.5)
WBC: 11.6 10*3/uL — ABNORMAL HIGH (ref 4.0–10.5)

## 2017-06-11 LAB — COMPREHENSIVE METABOLIC PANEL
ALT: 23 U/L (ref 0–35)
AST: 22 U/L (ref 0–37)
Albumin: 4.3 g/dL (ref 3.5–5.2)
Alkaline Phosphatase: 58 U/L (ref 39–117)
BILIRUBIN TOTAL: 0.4 mg/dL (ref 0.2–1.2)
BUN: 10 mg/dL (ref 6–23)
CO2: 28 meq/L (ref 19–32)
CREATININE: 0.73 mg/dL (ref 0.40–1.20)
Calcium: 9.3 mg/dL (ref 8.4–10.5)
Chloride: 104 mEq/L (ref 96–112)
GFR: 124.32 mL/min (ref >60.00)
GLUCOSE: 78 mg/dL (ref 70–99)
Potassium: 4.2 mEq/L (ref 3.5–5.1)
SODIUM: 138 meq/L (ref 135–145)
Total Protein: 7.8 g/dL (ref 6.0–8.3)

## 2017-06-11 LAB — TSH: TSH: 5.35 u[IU]/mL — AB (ref 0.35–4.50)

## 2017-06-17 ENCOUNTER — Encounter: Payer: Self-pay | Admitting: Family Medicine

## 2017-06-17 DIAGNOSIS — R7989 Other specified abnormal findings of blood chemistry: Secondary | ICD-10-CM

## 2017-06-18 ENCOUNTER — Other Ambulatory Visit (INDEPENDENT_AMBULATORY_CARE_PROVIDER_SITE_OTHER): Payer: BLUE CROSS/BLUE SHIELD

## 2017-06-18 DIAGNOSIS — R7989 Other specified abnormal findings of blood chemistry: Secondary | ICD-10-CM

## 2017-06-18 LAB — T3, FREE: T3 FREE: 3.6 pg/mL (ref 2.3–4.2)

## 2017-06-18 LAB — TSH: TSH: 4.13 u[IU]/mL (ref 0.35–4.50)

## 2017-06-18 LAB — T4, FREE: FREE T4: 0.74 ng/dL (ref 0.60–1.60)

## 2017-06-18 NOTE — Assessment & Plan Note (Signed)
Is struggling with worsening depression after leaving a job at [employer] [place], is feeling hopeless and depressed. She agrees to restart medications and counseling. She agrees to [facility] if she becomes a risk to herself. She is started on Paroxetine 20 mg daily and report any concerns. Counseled for 30 minutes of a 35 minute appointment.

## 2017-07-01 ENCOUNTER — Encounter: Payer: Self-pay | Admitting: Family Medicine

## 2017-07-01 ENCOUNTER — Ambulatory Visit (INDEPENDENT_AMBULATORY_CARE_PROVIDER_SITE_OTHER): Payer: BLUE CROSS/BLUE SHIELD | Admitting: Family Medicine

## 2017-07-01 VITALS — BP 118/90 | HR 70 | Temp 98.3°F | Resp 18 | Ht 66.0 in | Wt 194.8 lb

## 2017-07-01 DIAGNOSIS — Z0000 Encounter for general adult medical examination without abnormal findings: Secondary | ICD-10-CM

## 2017-07-01 DIAGNOSIS — K219 Gastro-esophageal reflux disease without esophagitis: Secondary | ICD-10-CM | POA: Diagnosis not present

## 2017-07-01 DIAGNOSIS — Z720 Tobacco use: Secondary | ICD-10-CM | POA: Diagnosis not present

## 2017-07-01 DIAGNOSIS — D72829 Elevated white blood cell count, unspecified: Secondary | ICD-10-CM | POA: Insufficient documentation

## 2017-07-01 DIAGNOSIS — F418 Other specified anxiety disorders: Secondary | ICD-10-CM

## 2017-07-01 DIAGNOSIS — K5900 Constipation, unspecified: Secondary | ICD-10-CM | POA: Diagnosis not present

## 2017-07-01 LAB — CBC WITH DIFFERENTIAL/PLATELET
BASOS ABS: 0.1 10*3/uL (ref 0.0–0.1)
Basophils Relative: 0.5 % (ref 0.0–3.0)
EOS ABS: 0.1 10*3/uL (ref 0.0–0.7)
Eosinophils Relative: 1.5 % (ref 0.0–5.0)
HCT: 47.7 % — ABNORMAL HIGH (ref 36.0–46.0)
Hemoglobin: 15.5 g/dL — ABNORMAL HIGH (ref 12.0–15.0)
LYMPHS ABS: 3.3 10*3/uL (ref 0.7–4.0)
LYMPHS PCT: 34.9 % (ref 12.0–46.0)
MCHC: 32.5 g/dL (ref 30.0–36.0)
MCV: 89.2 fl (ref 78.0–100.0)
MONO ABS: 0.4 10*3/uL (ref 0.1–1.0)
Monocytes Relative: 4.1 % (ref 3.0–12.0)
NEUTROS ABS: 5.6 10*3/uL (ref 1.4–7.7)
NEUTROS PCT: 59 % (ref 43.0–77.0)
PLATELETS: 364 10*3/uL (ref 150.0–400.0)
RBC: 5.35 Mil/uL — ABNORMAL HIGH (ref 3.87–5.11)
RDW: 14.6 % (ref 11.5–15.5)
WBC: 9.5 10*3/uL (ref 4.0–10.5)

## 2017-07-01 LAB — COMPREHENSIVE METABOLIC PANEL
ALT: 19 U/L (ref 0–35)
AST: 17 U/L (ref 0–37)
Albumin: 4.3 g/dL (ref 3.5–5.2)
Alkaline Phosphatase: 54 U/L (ref 39–117)
BUN: 10 mg/dL (ref 6–23)
CALCIUM: 9.5 mg/dL (ref 8.4–10.5)
CHLORIDE: 107 meq/L (ref 96–112)
CO2: 27 meq/L (ref 19–32)
CREATININE: 0.7 mg/dL (ref 0.40–1.20)
GFR: 130.43 mL/min (ref >60.00)
Glucose, Bld: 98 mg/dL (ref 70–99)
Potassium: 4.4 mEq/L (ref 3.5–5.1)
Sodium: 141 mEq/L (ref 135–145)
Total Bilirubin: 0.3 mg/dL (ref 0.2–1.2)
Total Protein: 7.9 g/dL (ref 6.0–8.3)

## 2017-07-01 LAB — LIPID PANEL
CHOL/HDL RATIO: 3
Cholesterol: 161 mg/dL (ref 0–200)
HDL: 61.7 mg/dL (ref >39.00)
LDL CALC: 88 mg/dL (ref 0–99)
NonHDL: 99.66
TRIGLYCERIDES: 59 mg/dL (ref 0.0–149.0)
VLDL: 11.8 mg/dL (ref 0.0–40.0)

## 2017-07-01 NOTE — Assessment & Plan Note (Signed)
Repeat CBc with differential today

## 2017-07-01 NOTE — Assessment & Plan Note (Addendum)
Encouraged complete cessation. Discussed need to quit as relates to risk of numerous cancers, cardiac and pulmonary disease as well as neurologic complications. Counseled for greater than 3 minutes. Is improving on 21 mg patch encouraged to come up with strategies when the cravings increase. Is tolerating the patches and slowly cutting down on tobacco intake

## 2017-07-01 NOTE — Patient Instructions (Signed)
Preventive Care 18-39 Years, Female Preventive care refers to lifestyle choices and visits with your health care provider that can promote health and wellness. What does preventive care include?  A yearly physical exam. This is also called an annual well check.  Dental exams once or twice a year.  Routine eye exams. Ask your health care provider how often you should have your eyes checked.  Personal lifestyle choices, including: ? Daily care of your teeth and gums. ? Regular physical activity. ? Eating a healthy diet. ? Avoiding tobacco and drug use. ? Limiting alcohol use. ? Practicing safe sex. ? Taking vitamin and mineral supplements as recommended by your health care provider. What happens during an annual well check? The services and screenings done by your health care provider during your annual well check will depend on your age, overall health, lifestyle risk factors, and family history of disease. Counseling Your health care provider may ask you questions about your:  Alcohol use.  Tobacco use.  Drug use.  Emotional well-being.  Home and relationship well-being.  Sexual activity.  Eating habits.  Work and work Statistician.  Method of birth control.  Menstrual cycle.  Pregnancy history.  Screening You may have the following tests or measurements:  Height, weight, and BMI.  Diabetes screening. This is done by checking your blood sugar (glucose) after you have not eaten for a while (fasting).  Blood pressure.  Lipid and cholesterol levels. These may be checked every 5 years starting at age 66.  Skin check.  Hepatitis C blood test.  Hepatitis B blood test.  Sexually transmitted disease (STD) testing.  BRCA-related cancer screening. This may be done if you have a family history of breast, ovarian, tubal, or peritoneal cancers.  Pelvic exam and Pap test. This may be done every 3 years starting at age 40. Starting at age 59, this may be done every 5  years if you have a Pap test in combination with an HPV test.  Discuss your test results, treatment options, and if necessary, the need for more tests with your health care provider. Vaccines Your health care provider may recommend certain vaccines, such as:  Influenza vaccine. This is recommended every year.  Tetanus, diphtheria, and acellular pertussis (Tdap, Td) vaccine. You may need a Td booster every 10 years.  Varicella vaccine. You may need this if you have not been vaccinated.  HPV vaccine. If you are 69 or younger, you may need three doses over 6 months.  Measles, mumps, and rubella (MMR) vaccine. You may need at least one dose of MMR. You may also need a second dose.  Pneumococcal 13-valent conjugate (PCV13) vaccine. You may need this if you have certain conditions and were not previously vaccinated.  Pneumococcal polysaccharide (PPSV23) vaccine. You may need one or two doses if you smoke cigarettes or if you have certain conditions.  Meningococcal vaccine. One dose is recommended if you are age 27-21 years and a first-year college student living in a residence hall, or if you have one of several medical conditions. You may also need additional booster doses.  Hepatitis A vaccine. You may need this if you have certain conditions or if you travel or work in places where you may be exposed to hepatitis A.  Hepatitis B vaccine. You may need this if you have certain conditions or if you travel or work in places where you may be exposed to hepatitis B.  Haemophilus influenzae type b (Hib) vaccine. You may need this if  you have certain risk factors.  Talk to your health care provider about which screenings and vaccines you need and how often you need them. This information is not intended to replace advice given to you by your health care provider. Make sure you discuss any questions you have with your health care provider. Document Released: 08/04/2001 Document Revised: 02/26/2016  Document Reviewed: 04/09/2015 Elsevier Interactive Patient Education  [employer].

## 2017-07-01 NOTE — Assessment & Plan Note (Addendum)
Well controlled, no.sbr changes to meds. Encouraged heart healthy diet such as the DASH diet and exercise as tolerated. Tolerating Zantac prn

## 2017-07-01 NOTE — Progress Notes (Signed)
Subjective:  I acted as a Education administrator for Dr. Charlett Blake. [place], [place]  Patient ID: Beverly Pearson, female    DOB: [birth year], 27 y.o.   MRN: [id number]  No chief complaint on file.   HPI  Patient is in today for an annual exam and follow up on depression.  She notes she is feeling improved from her recent visit.  She continues to struggle with anhedonia but does not endorse suicidal or homicidal ideation.  She has tolerated the addition of the medication and does feel she is a little calmer.  She has an appointment with counseling later this month.  She has gotten a new job but does not enjoy it and is looking for another new job.  She denies any recent febrile illness or acute hospitalization.  No other acute concerns are noted. But she does note a Thull history of mild constipation. No bloody or tarry stool.   Patient Care Team: Mosie Lukes, MD as PCP - General (Family Medicine)   Past Medical History:  Diagnosis Date  . Anxiety 02/17/2011  . Constipation   . Depression with anxiety 03/03/2011  . GERD (gastroesophageal reflux disease)   . Herpes simplex without mention of complication   . Insomnia 09/01/2011  . Overweight 04/21/2015  . Reflux 02/17/2011  . Reflux 03/03/2011  . Reflux 09/16/2011  . Tobacco use 12/25/2016    Past Surgical History:  Procedure Laterality Date  . TONSILLECTOMY AND ADENOIDECTOMY     age 75    Family History  Problem Relation Age of Onset  . Diabetes Mother        type 2  . Hypertension Mother   . Diabetes Maternal Grandmother        type 2  . Cancer Maternal Grandmother 70       ovarian  . Hypertension Maternal Grandmother   . Kidney failure Maternal Grandfather   . Hypertension Maternal Grandfather   . Kidney disease Maternal Grandfather   . Diabetes Paternal Grandmother        type 2  . Hypertension Paternal Grandmother   . Emphysema Paternal Grandfather        smoke  . COPD Paternal Grandfather   . Cancer Paternal Grandfather 67   prostate cancer  . Colon cancer Neg Hx     Social History   Socioeconomic History  . Marital status: Single    Spouse name: Not on file  . Number of children: 0  . Years of education: Not on file  . Highest education level: Not on file  Social Needs  . Financial resource strain: Not on file  . Food insecurity - worry: Not on file  . Food insecurity - inability: Not on file  . Transportation needs - medical: Not on file  . Transportation needs - non-medical: Not on file  Occupational History  . Occupation: Unemployed  Tobacco Use  . Smoking status: Current Every Day Smoker  . Smokeless tobacco: Never Used  . Tobacco comment: smokes 1 or 2 black and milds daily-  Substance and Sexual Activity  . Alcohol use: Yes    Comment: occ  . Drug use: Yes    Types: Marijuana  . Sexual activity: No    Birth control/protection: Pill  Other Topics Concern  . Not on file  Social History Narrative   Occasional soda     Outpatient Medications Prior to Visit  Medication Sig Dispense Refill  . Levonorgestrel-Ethinyl Estradiol (SEASONIQUE) 0.15-0.03 &0.01 MG tablet Take 1  tablet by mouth daily. 1 Package 11  . LORazepam (ATIVAN) 0.5 MG tablet Take 1 tablet (0.5 mg total) by mouth 2 (two) times daily as needed for anxiety. 30 tablet 1  . metoprolol tartrate (LOPRESSOR) 25 MG tablet Take 1 tablet (25 mg total) by mouth 2 (two) times daily. 60 tablet 3  . Multiple Vitamin (MULTIVITAMIN) tablet Take 1 tablet by mouth daily as needed.      . nicotine (NICODERM CQ - DOSED IN MG/24 HOURS) 14 mg/24hr patch Place 1 patch (14 mg total) onto the skin daily. Start after 21 mg patches done 28 patch 1  . nicotine (NICODERM CQ - DOSED IN MG/24 HOURS) 21 mg/24hr patch Place 1 patch (21 mg total) onto the skin daily. 28 patch 1  . nicotine (NICODERM CQ - DOSED IN MG/24 HR) 7 mg/24hr patch Place 1 patch (7 mg total) onto the skin daily. Start after 14 mg patches complete 28 patch 1  . PARoxetine (PAXIL) 20 MG  tablet Take 1 tablet (20 mg total) by mouth daily. 30 tablet 3  . ranitidine (ZANTAC) 300 MG tablet Take 1 tablet (300 mg total) by mouth daily as needed for heartburn. 30 tablet 2   No facility-administered medications prior to visit.     Allergies  Allergen Reactions  . Other     Review of Systems  Constitutional: Negative for fever and malaise/fatigue.  HENT: Negative for congestion.   Eyes: Negative for blurred vision.  Respiratory: Negative for cough and shortness of breath.   Cardiovascular: Negative for chest pain, palpitations and leg swelling.  Gastrointestinal: Positive for constipation. Negative for vomiting.  Musculoskeletal: Negative for back pain.  Skin: Negative for rash.  Neurological: Negative for loss of consciousness and headaches.  Psychiatric/Behavioral: Positive for depression. Negative for hallucinations, substance abuse and suicidal ideas. The patient is nervous/anxious.        Objective:    Physical Exam  Constitutional: She is oriented to person, place, and time. She appears well-developed and well-nourished. No distress.  HENT:  Head: Normocephalic and atraumatic.  Eyes: Conjunctivae are normal.  Neck: Normal range of motion. No thyromegaly present.  Cardiovascular: Normal rate and regular rhythm.  Pulmonary/Chest: Effort normal and breath sounds normal. She has no wheezes.  Abdominal: Soft. Bowel sounds are normal. There is no tenderness.  Musculoskeletal: Normal range of motion. She exhibits no edema or deformity.  Neurological: She is alert and oriented to person, place, and time.  Skin: Skin is warm and dry. She is not diaphoretic.  Psychiatric: She has a normal mood and affect.    BP 118/90 (BP Location: Left Arm, Patient Position: Sitting, Cuff Size: Normal)   Pulse 70   Temp 98.3 F (36.8 C) (Oral)   Resp 18   Ht _0  (1.676 m)   Wt 194 lb 12.8 oz (88.4 kg)   LMP 06/29/2017   SpO2 98%   BMI 31.44 kg/m  Wt Readings from Last 3  Encounters:  07/01/17 194 lb 12.8 oz (88.4 kg)  06/10/17 197 lb (89.4 kg)  01/20/17 181 lb (82.1 kg)   BP Readings from Last 3 Encounters:  07/01/17 118/90  06/10/17 (!) 150/102  02/01/17 (!) 154/106     Immunization History  Administered Date(s) Administered  . DTaP 01/15/1992, 03/09/1992, 05/03/1992, 02/14/1993, 12/01/1996  . Hepatitis A 03/26/2008  . Hepatitis B 12/14/1991, 01/15/1992, 08/06/1992  . HiB (PRP-OMP) 01/15/1992, 03/09/1992, 05/03/1992, 02/14/1993  . Influenza Nasal 03/26/2008, 05/26/2009  . Influenza Split 04/01/2011  .  Influenza Whole 03/21/2013  . Influenza,inj,Quad PF,6+ Mos 04/11/2015  . Influenza-Unspecified 06/03/2005  . MMR 02/14/1993, 12/01/1996  . Meningococcal Conjugate 03/26/2008  . OPV 01/15/1992, 03/09/1992, 11/05/1992, 02/14/1993, 12/01/1996  . Tdap 09/24/2004  . Varicella 08/31/1995    Health Maintenance  Topic Date Due  . PAP SMEAR  11/03/2012  . TETANUS/TDAP  09/25/2014  . INFLUENZA VACCINE  02/10/2018 (Originally 01/20/2017)  . HIV Screening  Completed    Lab Results  Component Value Date   WBC 11.6 (H) 06/10/2017   HGB 14.9 06/10/2017   HCT 45.7 06/10/2017   PLT 386.0 06/10/2017   GLUCOSE 78 06/10/2017   ALT 23 06/10/2017   AST 22 06/10/2017   NA 138 06/10/2017   K 4.2 06/10/2017   CL 104 06/10/2017   CREATININE 0.73 06/10/2017   BUN 10 06/10/2017   CO2 28 06/10/2017   TSH 4.13 06/18/2017    Lab Results  Component Value Date   TSH 4.13 06/18/2017   Lab Results  Component Value Date   WBC 11.6 (H) 06/10/2017   HGB 14.9 06/10/2017   HCT 45.7 06/10/2017   MCV 88.3 06/10/2017   PLT 386.0 06/10/2017   Lab Results  Component Value Date   NA 138 06/10/2017   K 4.2 06/10/2017   CO2 28 06/10/2017   GLUCOSE 78 06/10/2017   BUN 10 06/10/2017   CREATININE 0.73 06/10/2017   BILITOT 0.4 06/10/2017   ALKPHOS 58 06/10/2017   AST 22 06/10/2017   ALT 23 06/10/2017   PROT 7.8 06/10/2017   ALBUMIN 4.3 06/10/2017   CALCIUM  9.3 06/10/2017   GFR 124.32 06/10/2017   No results found for: CHOL No results found for: HDL No results found for: LDLCALC No results found for: TRIG No results found for: CHOLHDL No results found for: HGBA1C       Assessment & Plan:   Problem List Items Addressed This Visit    Constipation    Encouraged increased hydration and fiber in diet. Daily probiotics. If bowels not moving can use MOM 2 tbls po in 4 oz of warm prune juice by mouth every 2-3 days. If no results then repeat in 4 hours with  Dulcolax suppository pr, may repeat again in 4 more hours as needed. Seek care if symptoms worsen. Consider daily Miralax and/or Dulcolax if symptoms persist.       Relevant Orders   Comprehensive metabolic panel   Depression with anxiety    Doing better on current meds, has an appt with behavioral health later this month      GERD (gastroesophageal reflux disease)    Well controlled, no.sbr changes to meds. Encouraged heart healthy diet such as the DASH diet and exercise as tolerated. Tolerating Zantac prn      Relevant Orders   Comprehensive metabolic panel   Tobacco use    Encouraged complete cessation. Discussed need to quit as relates to risk of numerous cancers, cardiac and pulmonary disease as well as neurologic complications. Counseled for greater than 3 minutes. Is improving on 21 mg patch encouraged to come up with strategies when the cravings increase. Is tolerating the patches and slowly cutting down on tobacco intake      Preventative health care    Patient encouraged to maintain heart healthy diet, regular exercise, adequate sleep. Consider daily probiotics. Take medications as prescribed. Baseline cholesterol ordered today      Relevant Orders   Comprehensive metabolic panel   Lipid panel   Leukocytosis - Primary  Repeat CBc with differential today      Relevant Orders   CBC with Differential/Platelet      I am having Beverly S. Karras maintain her  multivitamin, Levonorgestrel-Ethinyl Estradiol, ranitidine, PARoxetine, LORazepam, metoprolol tartrate, nicotine, nicotine, and nicotine.  No orders of the defined types were placed in this encounter.   CMA served as Education administrator during this visit. History, Physical and Plan performed by medical provider. Documentation and orders reviewed and attested to.  Penni Homans, MD

## 2017-07-01 NOTE — Assessment & Plan Note (Signed)
Encouraged increased hydration and fiber in diet. Daily probiotics. If bowels not moving can use MOM 2 tbls po in 4 oz of warm prune juice by mouth every 2-3 days. If no results then repeat in 4 hours with  Dulcolax suppository pr, may repeat again in 4 more hours as needed. Seek care if symptoms worsen. Consider daily Miralax and/or Dulcolax if symptoms persist.  

## 2017-07-01 NOTE — Assessment & Plan Note (Signed)
Doing better on current meds, has an appt with behavioral health later this month

## 2017-07-01 NOTE — Assessment & Plan Note (Addendum)
Patient encouraged to maintain heart healthy diet, regular exercise, adequate sleep. Consider daily probiotics. Take medications as prescribed. Baseline cholesterol ordered today

## 2017-07-09 ENCOUNTER — Telehealth: Payer: Self-pay | Admitting: Family Medicine

## 2017-07-09 NOTE — Telephone Encounter (Signed)
Left message that this appt needs to be rescheduled per pcp.

## 2017-07-19 ENCOUNTER — Ambulatory Visit (INDEPENDENT_AMBULATORY_CARE_PROVIDER_SITE_OTHER): Payer: BLUE CROSS/BLUE SHIELD | Admitting: Psychology

## 2017-07-19 DIAGNOSIS — F331 Major depressive disorder, recurrent, moderate: Secondary | ICD-10-CM

## 2017-07-20 ENCOUNTER — Encounter: Payer: Self-pay | Admitting: Family Medicine

## 2017-07-20 NOTE — Progress Notes (Signed)
See scanned paperwork  

## 2017-08-03 ENCOUNTER — Ambulatory Visit: Payer: BLUE CROSS/BLUE SHIELD | Admitting: Family Medicine

## 2017-08-03 ENCOUNTER — Encounter: Payer: Self-pay | Admitting: Family Medicine

## 2017-08-03 DIAGNOSIS — Z720 Tobacco use: Secondary | ICD-10-CM

## 2017-08-03 DIAGNOSIS — K5900 Constipation, unspecified: Secondary | ICD-10-CM

## 2017-08-03 DIAGNOSIS — F418 Other specified anxiety disorders: Secondary | ICD-10-CM

## 2017-08-03 DIAGNOSIS — I10 Essential (primary) hypertension: Secondary | ICD-10-CM

## 2017-08-03 MED ORDER — PAROXETINE HCL 40 MG PO TABS: 40.0000 mg | ORAL_TABLET | ORAL | 2 refills | Status: DC

## 2017-08-03 NOTE — Patient Instructions (Signed)
Zinc 50 mg daily Biotin Major Depressive Disorder, Adult Major depressive disorder (MDD) is a mental health condition. MDD often makes you feel sad, hopeless, or helpless. MDD can also cause symptoms in your body. MDD can affect your:  Work.  School.  Relationships.  Other normal activities.  MDD can range from mild to very bad. It may occur once (single episode MDD). It can also occur many times (recurrent MDD). The main symptoms of MDD often include:  Feeling sad, depressed, or irritable most of the time.  Loss of interest.  MDD symptoms also include:  Sleeping too much or too little.  Eating too much or too little.  A change in your weight.  Feeling tired (fatigue) or having low energy.  Feeling worthless.  Feeling guilty.  Trouble making decisions.  Trouble thinking clearly.  Thoughts of suicide or harming others.  Feeling weak.  Feeling agitated.  Keeping yourself from being around other people (isolation).  Follow these instructions at home: Activity  Do these things as told by your doctor: ? Go back to your normal activities. ? Exercise regularly. ? Spend time outdoors. Alcohol  Talk with your doctor about how alcohol can affect your antidepressant medicines.  Do not drink alcohol. Or, limit how much alcohol you drink. ? This means no more than 1 drink a day for nonpregnant women and 2 drinks a day for men. One drink equals one of these:  12 oz of beer.  5 oz of wine.  1 oz of hard liquor. General instructions  Take over-the-counter and prescription medicines only as told by your doctor.  Eat a healthy diet.  Get plenty of sleep.  Find activities that you enjoy. Make time to do them.  Think about joining a support group. Your doctor may be able to suggest a group for you.  Keep all follow-up visits as told by your doctor. This is important. Where to find more information:  [employer] Mental  Illness: ? www.nami.org  [employer]: ? http://www.maynard.net/www.nimh.nih.gov  National Suicide Prevention Lifeline: ? (478)364-62931-432-161-8944. This is free, 24-hour help. Contact a doctor if:  Your symptoms get worse.  You have new symptoms. Get help right away if:  You self-harm.  You see, hear, taste, smell, or feel things that are not present (hallucinate). If you ever feel like you may hurt yourself or others, or have thoughts about taking your own life, get help right away. You can go to your nearest emergency department or call:  Your local emergency services (911 in the [place]  A suicide crisis helpline, such as the National Suicide Prevention Lifeline: ? 320-158-25561-432-161-8944. This is open 24 hours a day.  This information is not intended to replace advice given to you by your health care provider. Make sure you discuss any questions you have with your health care provider. Document Released: 05/20/2015 Document Revised: 02/23/2016 Document Reviewed: 02/23/2016 Elsevier Interactive Patient Education  2017 [employer]

## 2017-08-04 DIAGNOSIS — I10 Essential (primary) hypertension: Secondary | ICD-10-CM | POA: Insufficient documentation

## 2017-08-04 NOTE — Assessment & Plan Note (Signed)
Is using nicotine patches and is down to just 1-2 cigarettes daily, is encouraged to continue her efforts. Encouraged complete cessation. Discussed need to quit as relates to risk of numerous cancers, cardiac and pulmonary disease as well as neurologic complications. Counseled for greater than 3 minutes

## 2017-08-04 NOTE — Assessment & Plan Note (Signed)
Is doing better and tryin

## 2017-08-04 NOTE — Assessment & Plan Note (Signed)
She is doing much better with counseling and the addition of fluoxetine. No change in meds today.

## 2017-08-04 NOTE — Assessment & Plan Note (Signed)
Is not taking her metoprolol agrees to restart. Was well controlled on the medication.

## 2017-08-04 NOTE — Progress Notes (Signed)
Patient ID: Beverly Pearson, female   DOB: 12-Jan-1991, 27 y.o.   MRN: [id number]   Subjective:    Patient ID: Beverly Pearson, female    DOB: [birth year], 27 y.o.   MRN: [id number]  Chief Complaint  Patient presents with  . Hypertension    10 week follow up  . Medication Check    HPI Patient is in today for follow up and she is feeling much better. She feels the Paxil has been helping. She is smiling and more engaged in the visit. She has started counseling and finds that helpful. She feels she would benefit from having it more often. No suicidal or homicidal ideation at this time. Is doing a good job of cutting down on her cigarette smoking withe use of the nicotine patches. She is down to 1-2 cigarettes daily. Denies CP/palp/SOB/HA/congestion/fevers/GI or GU c/o. Taking meds as prescribed  Past Medical History:  Diagnosis Date  . Anxiety 02/17/2011  . Constipation   . Depression with anxiety 03/03/2011  . GERD (gastroesophageal reflux disease)   . Herpes simplex without mention of complication   . Insomnia 09/01/2011  . Overweight 04/21/2015  . Reflux 02/17/2011  . Reflux 03/03/2011  . Reflux 09/16/2011  . Tobacco use 12/25/2016    Past Surgical History:  Procedure Laterality Date  . TONSILLECTOMY AND ADENOIDECTOMY     age 24    Family History  Problem Relation Age of Onset  . Diabetes Mother        type 2  . Hypertension Mother   . Diabetes Maternal Grandmother        type 2  . Cancer Maternal Grandmother 70       ovarian  . Hypertension Maternal Grandmother   . Kidney failure Maternal Grandfather   . Hypertension Maternal Grandfather   . Kidney disease Maternal Grandfather   . Diabetes Paternal Grandmother        type 2  . Hypertension Paternal Grandmother   . Emphysema Paternal Grandfather        smoke  . COPD Paternal Grandfather   . Cancer Paternal Grandfather 70       prostate cancer  . Colon cancer Neg Hx     Social History   Socioeconomic History  . Marital  status: Single    Spouse name: Not on file  . Number of children: 0  . Years of education: Not on file  . Highest education level: Not on file  Social Needs  . Financial resource strain: Not on file  . Food insecurity - worry: Not on file  . Food insecurity - inability: Not on file  . Transportation needs - medical: Not on file  . Transportation needs - non-medical: Not on file  Occupational History  . Occupation: Unemployed  Tobacco Use  . Smoking status: Current Every Day Smoker  . Smokeless tobacco: Never Used  . Tobacco comment: smokes 1 or 2 black and milds daily-  Substance and Sexual Activity  . Alcohol use: Yes    Comment: occ  . Drug use: Yes    Types: Marijuana  . Sexual activity: No    Birth control/protection: Pill  Other Topics Concern  . Not on file  Social History Narrative   Occasional soda     Outpatient Medications Prior to Visit  Medication Sig Dispense Refill  . Levonorgestrel-Ethinyl Estradiol (SEASONIQUE) 0.15-0.03 &0.01 MG tablet Take 1 tablet by mouth daily. 1 Package 11  . LORazepam (ATIVAN) 0.5 MG tablet Take 1  tablet (0.5 mg total) by mouth 2 (two) times daily as needed for anxiety. 30 tablet 1  . metoprolol tartrate (LOPRESSOR) 25 MG tablet Take 1 tablet (25 mg total) by mouth 2 (two) times daily. 60 tablet 3  . Multiple Vitamin (MULTIVITAMIN) tablet Take 1 tablet by mouth daily as needed.      . nicotine (NICODERM CQ - DOSED IN MG/24 HOURS) 14 mg/24hr patch Place 1 patch (14 mg total) onto the skin daily. Start after 21 mg patches done 28 patch 1  . nicotine (NICODERM CQ - DOSED IN MG/24 HOURS) 21 mg/24hr patch Place 1 patch (21 mg total) onto the skin daily. 28 patch 1  . nicotine (NICODERM CQ - DOSED IN MG/24 HR) 7 mg/24hr patch Place 1 patch (7 mg total) onto the skin daily. Start after 14 mg patches complete 28 patch 1  . ranitidine (ZANTAC) 300 MG tablet Take 1 tablet (300 mg total) by mouth daily as needed for heartburn. 30 tablet 2  .  PARoxetine (PAXIL) 20 MG tablet Take 1 tablet (20 mg total) by mouth daily. 30 tablet 3   No facility-administered medications prior to visit.     Allergies  Allergen Reactions  . Other     Review of Systems  Constitutional: Negative for fever and malaise/fatigue.  HENT: Negative for congestion.   Eyes: Negative for blurred vision.  Respiratory: Negative for shortness of breath.   Cardiovascular: Negative for chest pain, palpitations and leg swelling.  Gastrointestinal: Negative for abdominal pain, blood in stool and nausea.  Genitourinary: Negative for dysuria and frequency.  Musculoskeletal: Negative for falls.  Skin: Negative for rash.  Neurological: Negative for dizziness, loss of consciousness and headaches.  Endo/Heme/Allergies: Negative for environmental allergies.  Psychiatric/Behavioral: Positive for depression. The patient is not nervous/anxious.        Objective:    Physical Exam  Constitutional: She is oriented to person, place, and time. She appears well-developed and well-nourished. No distress.  HENT:  Head: Normocephalic and atraumatic.  Nose: Nose normal.  Eyes: Right eye exhibits no discharge. Left eye exhibits no discharge.  Neck: Normal range of motion. Neck supple.  Cardiovascular: Normal rate and regular rhythm.  No murmur heard. Pulmonary/Chest: Effort normal and breath sounds normal.  Abdominal: Soft. Bowel sounds are normal. There is no tenderness.  Musculoskeletal: She exhibits no edema.  Neurological: She is alert and oriented to person, place, and time.  Skin: Skin is warm and dry.  Psychiatric: She has a normal mood and affect.  Nursing note and vitals reviewed.   BP (!) 154/114 (BP Location: Left Arm, Patient Position: Sitting, Cuff Size: Normal)   Pulse 76   Temp 98.2 F (36.8 C) (Oral)   Resp 16   Ht 5\' 6"  (1.676 m)   Wt 190 lb 12.8 oz (86.5 kg)   SpO2 94%   BMI 30.80 kg/m  Wt Readings from Last 3 Encounters:  08/03/17 190 lb  12.8 oz (86.5 kg)  07/01/17 194 lb 12.8 oz (88.4 kg)  06/10/17 197 lb (89.4 kg)     Lab Results  Component Value Date   WBC 9.5 07/01/2017   HGB 15.5 (H) 07/01/2017   HCT 47.7 (H) 07/01/2017   PLT 364.0 07/01/2017   GLUCOSE 98 07/01/2017   CHOL 161 07/01/2017   TRIG 59.0 07/01/2017   HDL 61.70 07/01/2017   LDLCALC 88 07/01/2017   ALT 19 07/01/2017   AST 17 07/01/2017   NA 141 07/01/2017   K 4.4  07/01/2017   CL 107 07/01/2017   CREATININE 0.70 07/01/2017   BUN 10 07/01/2017   CO2 27 07/01/2017   TSH 4.13 06/18/2017    Lab Results  Component Value Date   TSH 4.13 06/18/2017   Lab Results  Component Value Date   WBC 9.5 07/01/2017   HGB 15.5 (H) 07/01/2017   HCT 47.7 (H) 07/01/2017   MCV 89.2 07/01/2017   PLT 364.0 07/01/2017   Lab Results  Component Value Date   NA 141 07/01/2017   K 4.4 07/01/2017   CO2 27 07/01/2017   GLUCOSE 98 07/01/2017   BUN 10 07/01/2017   CREATININE 0.70 07/01/2017   BILITOT 0.3 07/01/2017   ALKPHOS 54 07/01/2017   AST 17 07/01/2017   ALT 19 07/01/2017   PROT 7.9 07/01/2017   ALBUMIN 4.3 07/01/2017   CALCIUM 9.5 07/01/2017   GFR 130.43 07/01/2017   Lab Results  Component Value Date   CHOL 161 07/01/2017   Lab Results  Component Value Date   HDL 61.70 07/01/2017   Lab Results  Component Value Date   LDLCALC 88 07/01/2017   Lab Results  Component Value Date   TRIG 59.0 07/01/2017   Lab Results  Component Value Date   CHOLHDL 3 07/01/2017   No results found for: HGBA1C     Assessment & Plan:   Problem List Items Addressed This Visit    Constipation    Is doing better and tryin      Depression with anxiety    She is doing much better with counseling and the addition of fluoxetine. No change in meds today.       Relevant Medications   PARoxetine (PAXIL) 40 MG tablet   Tobacco use    Is using nicotine patches and is down to just 1-2 cigarettes daily, is encouraged to continue her efforts. Encouraged  complete cessation. Discussed need to quit as relates to risk of numerous cancers, cardiac and pulmonary disease as well as neurologic complications. Counseled for greater than 3 minutes      Hypertension    Is not taking her metoprolol agrees to restart. Was well controlled on the medication.         I have discontinued Beverly S. Angst's PARoxetine. I am also having her start on PARoxetine. Additionally, I am having her maintain her multivitamin, Levonorgestrel-Ethinyl Estradiol, ranitidine, LORazepam, metoprolol tartrate, nicotine, nicotine, and nicotine.  Meds ordered this encounter  Medications  . PARoxetine (PAXIL) 40 MG tablet    Sig: Take 1 tablet (40 mg total) by mouth every morning.    Dispense:  30 tablet    Refill:  2    Danise Edge, MD

## 2017-08-11 ENCOUNTER — Ambulatory Visit: Payer: BLUE CROSS/BLUE SHIELD | Admitting: Psychology

## 2017-08-11 DIAGNOSIS — F331 Major depressive disorder, recurrent, moderate: Secondary | ICD-10-CM | POA: Diagnosis not present

## 2017-08-25 ENCOUNTER — Ambulatory Visit: Payer: BLUE CROSS/BLUE SHIELD | Admitting: Psychology

## 2017-08-25 DIAGNOSIS — F331 Major depressive disorder, recurrent, moderate: Secondary | ICD-10-CM

## 2017-09-03 ENCOUNTER — Ambulatory Visit: Payer: Self-pay | Admitting: Family Medicine

## 2017-09-09 ENCOUNTER — Encounter: Payer: Self-pay | Admitting: Family Medicine

## 2017-09-09 ENCOUNTER — Ambulatory Visit: Payer: BLUE CROSS/BLUE SHIELD | Admitting: Family Medicine

## 2017-09-09 VITALS — BP 128/80 | HR 89 | Temp 98.6°F | Resp 16 | Ht 66.14 in | Wt 193.0 lb

## 2017-09-09 DIAGNOSIS — F339 Major depressive disorder, recurrent, unspecified: Secondary | ICD-10-CM

## 2017-09-09 NOTE — Progress Notes (Signed)
Patient left without being seen.

## 2017-09-12 MED ORDER — PAROXETINE HCL 40 MG PO TABS: 40.0000 mg | ORAL_TABLET | ORAL | 1 refills | Status: DC

## 2017-09-13 ENCOUNTER — Ambulatory Visit: Payer: BLUE CROSS/BLUE SHIELD | Admitting: Psychology

## 2017-09-30 ENCOUNTER — Other Ambulatory Visit: Payer: Self-pay | Admitting: Family Medicine

## 2017-10-06 ENCOUNTER — Ambulatory Visit: Payer: Self-pay | Admitting: Psychology

## 2017-11-17 ENCOUNTER — Ambulatory Visit: Payer: Self-pay | Admitting: *Deleted

## 2017-11-17 NOTE — Telephone Encounter (Signed)
Patient reports epigastric pain with nausea and some diarrhea with onset of symptoms about 4 hours ago. Pt advised to go to urgent care as soon as possible Directions given.Pt advised to remain  NPO    Reason for Disposition . [1] MILD-MODERATE pain AND [2] constant AND [3] present > 2 hours  Answer Assessment - Initial Assessment Questions 1. LOCATION: "Where does it hurt?"       EPIGASTRIC PAIN    2. RADIATION: "Does the pain shoot anywhere else?" (e.g., chest, back)      Radiates  Downward  Then it  Cramps    3. ONSET: "When did the pain begin?" (e.g., minutes, hours or days ago)         12 pm  Today  4. SUDDEN: "Gradual or sudden onset?"          Sudden onset  5. PATTERN "Does the pain come and go, or is it constant?"    - If constant: "Is it getting better, staying the same, or worsening?"      (Note: Constant means the pain never goes away completely; most serious pain is constant and it progresses)     - If intermittent: "How Joyce does it last?" "Do you have pain now?"     (Note: Intermittent means the pain goes away completely between bouts)     Constant   6. SEVERITY: "How bad is the pain?"  (e.g., Scale 1-10; mild, moderate, or severe)   - MILD (1-3): doesn't interfere with normal activities, abdomen soft and not tender to touch    - MODERATE (4-7): interferes with normal activities or awakens from sleep, tender to touch    - SEVERE (8-10): excruciating pain, doubled over, unable to do any normal activities        Moderate   7. RECURRENT SYMPTOM: "Have you ever had this type of abdominal pain before?" If so, ask: "When was the last time?" and "What happened that time?"       No  8. CAUSE: "What do you think is causing the abdominal pain?"     Possibly a  Smoothie pt  Reports  On an empty stomach   9. RELIEVING/AGGRAVATING FACTORS: "What makes it better or worse?" (e.g., movement, antacids, bowel movement)       No   10. OTHER SYMPTOMS: "Has there been any vomiting, diarrhea,  constipation, or urine problems?"       Nausea  Two loose stools in the past hour   11. PREGNANCY: "Is there any chance you are pregnant?" "When was your last menstrual period?"          No   lmp 5  Weeks  Ago  Oral  Birth control pills  Protocols used: ABDOMINAL PAIN - [facility]

## 2017-11-18 LAB — HM PAP SMEAR: HM Pap smear: NORMAL

## 2017-11-20 ENCOUNTER — Encounter: Payer: Self-pay | Admitting: Family Medicine

## 2017-11-20 ENCOUNTER — Ambulatory Visit (INDEPENDENT_AMBULATORY_CARE_PROVIDER_SITE_OTHER): Payer: Self-pay | Admitting: Family Medicine

## 2017-11-20 VITALS — BP 118/80 | HR 100 | Temp 98.0°F | Ht 66.0 in | Wt 182.8 lb

## 2017-11-20 DIAGNOSIS — A084 Viral intestinal infection, unspecified: Secondary | ICD-10-CM

## 2017-11-20 MED ORDER — ONDANSETRON HCL 8 MG PO TABS: 8.0000 mg | ORAL_TABLET | Freq: Three times a day (TID) | ORAL | 0 refills | Status: DC | PRN

## 2017-11-20 MED ORDER — DICYCLOMINE HCL 20 MG PO TABS: 20.0000 mg | ORAL_TABLET | Freq: Four times a day (QID) | ORAL | 0 refills | Status: DC

## 2017-11-20 NOTE — Progress Notes (Signed)
   Subjective:    Patient ID: Beverly Pearson, female    DOB: 1990-06-27, 27 y.o.   MRN: [id number]  HPI Here for 3 days of intermittent generalized abdominal cramps with nausea and vomiting. She had diarrhea the first day but ha shad solid stools since then. No fever. No recent travel.    Review of Systems  Constitutional: Negative.   Respiratory: Negative.   Cardiovascular: Negative.   Gastrointestinal: Positive for abdominal pain, nausea and vomiting. Negative for anal bleeding, blood in stool, constipation and diarrhea.  Genitourinary: Negative.        Objective:   Physical Exam  Constitutional: She appears well-developed and well-nourished. No distress.  Cardiovascular: Normal rate, regular rhythm, normal heart sounds and intact distal pulses.  Pulmonary/Chest: Effort normal and breath sounds normal.  Abdominal: Soft. Bowel sounds are normal. She exhibits distension. She exhibits no mass. There is no rebound and no guarding. No hernia.  Mild generalized tenderness           Assessment & Plan:  Viral enteritis. Drink fluids. Use Dicyclomine for cramps and Zofran for nausea. Continue her usual Zantac. Recheck if the pains get worse or if she gets a fever.  Gershon CraneStephen Akio Hudnall, MD

## 2019-02-21 ENCOUNTER — Encounter: Payer: Self-pay | Admitting: Family Medicine

## 2019-03-02 NOTE — Telephone Encounter (Signed)
Called to schedule in office visit - no answer and VM full - sent mychart msg to pt.

## 2019-04-27 ENCOUNTER — Encounter: Payer: Self-pay | Admitting: Family Medicine

## 2019-04-27 DIAGNOSIS — Z113 Encounter for screening for infections with a predominantly sexual mode of transmission: Secondary | ICD-10-CM

## 2019-05-01 ENCOUNTER — Other Ambulatory Visit: Payer: Self-pay | Admitting: *Deleted

## 2019-05-01 NOTE — Progress Notes (Signed)
error 

## 2019-07-30 ENCOUNTER — Other Ambulatory Visit: Payer: Self-pay | Admitting: Family Medicine

## 2019-08-22 ENCOUNTER — Encounter: Payer: Self-pay | Admitting: Family Medicine

## 2019-10-04 ENCOUNTER — Other Ambulatory Visit: Payer: Self-pay | Admitting: Family Medicine

## 2019-11-21 ENCOUNTER — Ambulatory Visit (INDEPENDENT_AMBULATORY_CARE_PROVIDER_SITE_OTHER): Payer: BLUE CROSS/BLUE SHIELD | Admitting: Family Medicine

## 2019-11-21 ENCOUNTER — Other Ambulatory Visit (HOSPITAL_COMMUNITY)
Admission: RE | Admit: 2019-11-21 | Discharge: 2019-11-21 | Disposition: A | Discharge status: Home / Self Care | Payer: BLUE CROSS/BLUE SHIELD | Source: Ambulatory Visit | Attending: Family Medicine | Admitting: Family Medicine

## 2019-11-21 ENCOUNTER — Encounter: Payer: Self-pay | Admitting: Family Medicine

## 2019-11-21 ENCOUNTER — Other Ambulatory Visit: Payer: Self-pay

## 2019-11-21 ENCOUNTER — Other Ambulatory Visit: Payer: Self-pay | Admitting: Family Medicine

## 2019-11-21 VITALS — BP 138/88 | HR 97 | Temp 98.4°F | Resp 12 | Ht 66.0 in | Wt 185.2 lb

## 2019-11-21 DIAGNOSIS — F339 Major depressive disorder, recurrent, unspecified: Secondary | ICD-10-CM

## 2019-11-21 DIAGNOSIS — D582 Other hemoglobinopathies: Secondary | ICD-10-CM

## 2019-11-21 DIAGNOSIS — Z7251 High risk heterosexual behavior: Secondary | ICD-10-CM | POA: Diagnosis not present

## 2019-11-21 DIAGNOSIS — E010 Iodine-deficiency related diffuse (endemic) goiter: Secondary | ICD-10-CM | POA: Diagnosis not present

## 2019-11-21 DIAGNOSIS — F419 Anxiety disorder, unspecified: Secondary | ICD-10-CM

## 2019-11-21 DIAGNOSIS — Z0000 Encounter for general adult medical examination without abnormal findings: Secondary | ICD-10-CM

## 2019-11-21 DIAGNOSIS — R41840 Attention and concentration deficit: Secondary | ICD-10-CM

## 2019-11-21 DIAGNOSIS — I10 Essential (primary) hypertension: Secondary | ICD-10-CM

## 2019-11-21 DIAGNOSIS — Z720 Tobacco use: Secondary | ICD-10-CM

## 2019-11-21 DIAGNOSIS — F418 Other specified anxiety disorders: Secondary | ICD-10-CM

## 2019-11-21 DIAGNOSIS — N63 Unspecified lump in unspecified breast: Secondary | ICD-10-CM

## 2019-11-21 DIAGNOSIS — N6310 Unspecified lump in the right breast, unspecified quadrant: Secondary | ICD-10-CM

## 2019-11-21 LAB — CBC
HCT: 43.7 % (ref 36.0–46.0)
Hemoglobin: 14.4 g/dL (ref 12.0–15.0)
MCHC: 33 g/dL (ref 30.0–36.0)
MCV: 88.1 fl (ref 78.0–100.0)
Platelets: 365 10*3/uL (ref 150.0–400.0)
RBC: 4.96 Mil/uL (ref 3.87–5.11)
RDW: 14.2 % (ref 11.5–15.5)
WBC: 10.2 10*3/uL (ref 4.0–10.5)

## 2019-11-21 LAB — TSH: TSH: 3.52 u[IU]/mL (ref 0.35–4.50)

## 2019-11-21 NOTE — Patient Instructions (Signed)
Preventive Care 20-28 Years Old, Female Preventive care refers to visits with your health care provider and lifestyle choices that can promote health and wellness. This includes:  A yearly physical exam. This may also be called an annual well check.  Regular dental visits and eye exams.  Immunizations.  Screening for certain conditions.  Healthy lifestyle choices, such as eating a healthy diet, getting regular exercise, not using drugs or products that contain nicotine and tobacco, and limiting alcohol use. What can I expect for my preventive care visit? Physical exam Your health care provider will check your:  Height and weight. This may be used to calculate body mass index (BMI), which tells if you are at a healthy weight.  Heart rate and blood pressure.  Skin for abnormal spots. Counseling Your health care provider may ask you questions about your:  Alcohol, tobacco, and drug use.  Emotional well-being.  Home and relationship well-being.  Sexual activity.  Eating habits.  Work and work Statistician.  Method of birth control.  Menstrual cycle.  Pregnancy history. What immunizations do I need?  Influenza (flu) vaccine  This is recommended every year. Tetanus, diphtheria, and pertussis (Tdap) vaccine  You may need a Td booster every 10 years. Varicella (chickenpox) vaccine  You may need this if you have not been vaccinated. Human papillomavirus (HPV) vaccine  If recommended by your health care provider, you may need three doses over 6 months. Measles, mumps, and rubella (MMR) vaccine  You may need at least one dose of MMR. You may also need a second dose. Meningococcal conjugate (MenACWY) vaccine  One dose is recommended if you are age 75-21 years and a first-year college student living in a residence hall, or if you have one of several medical conditions. You may also need additional booster doses. Pneumococcal conjugate (PCV13) vaccine  You may need  this if you have certain conditions and were not previously vaccinated. Pneumococcal polysaccharide (PPSV23) vaccine  You may need one or two doses if you smoke cigarettes or if you have certain conditions. Hepatitis A vaccine  You may need this if you have certain conditions or if you travel or work in places where you may be exposed to hepatitis A. Hepatitis B vaccine  You may need this if you have certain conditions or if you travel or work in places where you may be exposed to hepatitis B. Haemophilus influenzae type b (Hib) vaccine  You may need this if you have certain conditions. You may receive vaccines as individual doses or as more than one vaccine together in one shot (combination vaccines). Talk with your health care provider about the risks and benefits of combination vaccines. What tests do I need?  Blood tests  Lipid and cholesterol levels. These may be checked every 5 years starting at age 33.  Hepatitis C test.  Hepatitis B test. Screening  Diabetes screening. This is done by checking your blood sugar (glucose) after you have not eaten for a while (fasting).  Sexually transmitted disease (STD) testing.  BRCA-related cancer screening. This may be done if you have a family history of breast, ovarian, tubal, or peritoneal cancers.  Pelvic exam and Pap test. This may be done every 3 years starting at age 76. Starting at age 102, this may be done every 5 years if you have a Pap test in combination with an HPV test. Talk with your health care provider about your test results, treatment options, and if necessary, the need for more tests.  Follow these instructions at home: Eating and drinking   Eat a diet that includes fresh fruits and vegetables, whole grains, lean protein, and low-fat dairy.  Take vitamin and mineral supplements as recommended by your health care provider.  Do not drink alcohol if: ? Your health care provider tells you not to drink. ? You are  pregnant, may be pregnant, or are planning to become pregnant.  If you drink alcohol: ? Limit how much you have to 0-1 drink a day. ? Be aware of how much alcohol is in your drink. In the [place] one drink equals one 12 oz bottle of beer (355 mL), one 5 oz glass of wine (148 mL), or one 1 oz glass of hard liquor (44 mL). Lifestyle  Take daily care of your teeth and gums.  Stay active. Exercise for at least 30 minutes on 5 or more days each week.  Do not use any products that contain nicotine or tobacco, such as cigarettes, e-cigarettes, and chewing tobacco. If you need help quitting, ask your health care provider.  If you are sexually active, practice safe sex. Use a condom or other form of birth control (contraception) in order to prevent pregnancy and STIs (sexually transmitted infections). If you plan to become pregnant, see your health care provider for a preconception visit. What's next?  Visit your health care provider once a year for a well check visit.  Ask your health care provider how often you should have your eyes and teeth checked.  Stay up to date on all vaccines. This information is not intended to replace advice given to you by your health care provider. Make sure you discuss any questions you have with your health care provider. Document Revised: 02/17/2018 Document Reviewed: 02/17/2018 Elsevier Patient Education  2020 [employer].

## 2019-11-22 DIAGNOSIS — N63 Unspecified lump in unspecified breast: Secondary | ICD-10-CM | POA: Insufficient documentation

## 2019-11-22 DIAGNOSIS — N946 Dysmenorrhea, unspecified: Secondary | ICD-10-CM | POA: Insufficient documentation

## 2019-11-22 DIAGNOSIS — D582 Other hemoglobinopathies: Secondary | ICD-10-CM | POA: Insufficient documentation

## 2019-11-22 DIAGNOSIS — E010 Iodine-deficiency related diffuse (endemic) goiter: Secondary | ICD-10-CM | POA: Insufficient documentation

## 2019-11-22 LAB — HSV 2 ANTIBODY, IGG: HSV 2 Glycoprotein G Ab, IgG: <0.9 index

## 2019-11-22 LAB — HIV ANTIBODY (ROUTINE TESTING W REFLEX): HIV 1&2 Ab, 4th Generation: NONREACTIVE

## 2019-11-22 LAB — RPR: RPR Ser Ql: NONREACTIVE

## 2019-11-22 NOTE — Assessment & Plan Note (Signed)
With poor concentration, mood swings and irritability. She agrees to referral to psychiatry for evaluation due to the complexity of her symptoms.

## 2019-11-22 NOTE — Progress Notes (Signed)
Subjective:    Patient ID: Beverly Pearson, female    DOB: 08-Dec-1990, 29 y.o.   MRN: [id number]  Chief Complaint  Patient presents with  . Annual Exam    HPI Patient is in today for annual preventative exam. No recent febrile illness or hospitalizations. She is struggling with ongoing anxiety, depression, poor concentration. She is noting trouble completing tasks and this then increases her anxiety. She realizes this has been ongoing since childhood. She is wondering if she has ADD. She notes her mood swings have worsened since the pandemic started. 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 She notes her dentist felt her thyroid was enlarged but she does not have any symptoms such as cough or dysphagia. No heat or cold intolerance. Denies CP/palp/SOB/HA/congestion/fevers/GI or GU c/o. Taking meds as prescribed  Past Medical History:  Diagnosis Date  . Anxiety 02/17/2011  . Constipation   . Depression with anxiety 03/03/2011  . GERD (gastroesophageal reflux disease)   . Herpes simplex without mention of complication   . Insomnia 09/01/2011  . Overweight 04/21/2015  . Reflux 02/17/2011  . Reflux 03/03/2011  . Reflux 09/16/2011  . Tobacco use 12/25/2016    Past Surgical History:  Procedure Laterality Date  . TONSILLECTOMY AND ADENOIDECTOMY     age 42    Family History  Problem Relation Age of Onset  . Diabetes Mother        type 2  . Hypertension Mother   . Diabetes Maternal Grandmother        type 2  . Cancer Maternal Grandmother 70       ovarian  . Hypertension Maternal Grandmother   . Kidney failure Maternal Grandfather   . Hypertension Maternal Grandfather   . Kidney disease Maternal Grandfather   . Diabetes Paternal Grandmother        type 2  . Hypertension Paternal Grandmother   . Emphysema Paternal Grandfather        smoke  . COPD Paternal Grandfather   . Cancer Paternal Grandfather 36       prostate cancer  . Colon cancer Neg Hx     Social History    Socioeconomic History  . Marital status: Single    Spouse name: Not on file  . Number of children: 0  . Years of education: Not on file  . Highest education level: Not on file  Occupational History  . Occupation: Unemployed  Tobacco Use  . Smoking status: Current Every Day Smoker    Types: Cigarettes  . Smokeless tobacco: Never Used  . Tobacco comment: smokes 1 or 2 black and milds daily-  Substance and Sexual Activity  . Alcohol use: Yes    Comment: occ  . Drug use: Yes    Types: Marijuana  . Sexual activity: Never    Birth control/protection: Pill  Other Topics Concern  . Not on file  Social History Narrative   Occasional soda    Social Determinants of Health   Financial Resource Strain:   . Difficulty of Paying Living Expenses:   Food Insecurity:   . Worried About Charity fundraiser in the Last Year:   . Arboriculturist in the Last Year:   Transportation Needs:   . Film/video editor (Medical):   Marland Kitchen Lack of Transportation (Non-Medical):   Physical Activity:   . Days of Exercise per Week:   . Minutes of Exercise per Session:   Stress:   .  Feeling of Stress :   Social Connections:   . Frequency of Communication with Friends and Family:   . Frequency of Social Gatherings with Friends and Family:   . Attends Religious Services:   . Active Member of Clubs or Organizations:   . Attends Banker Meetings:   Marland Kitchen Marital Status:   Intimate Partner Violence:   . Fear of Current or Ex-Partner:   . Emotionally Abused:   Marland Kitchen Physically Abused:   . Sexually Abused:     Outpatient Medications Prior to Visit  Medication Sig Dispense Refill  . Levonorgestrel-Ethinyl Estradiol (SEASONIQUE) 0.15-0.03 &0.01 MG tablet Take 1 tablet by mouth daily. 1 Package 11  . LORazepam (ATIVAN) 0.5 MG tablet Take 1 tablet (0.5 mg total) by mouth 2 (two) times daily as needed for anxiety. 30 tablet 1  . metoprolol tartrate (LOPRESSOR) 25 MG tablet Take 1 tablet by mouth  twice daily 60 tablet 0  . Multiple Vitamin (MULTIVITAMIN) tablet Take 1 tablet by mouth daily as needed.      . dicyclomine (BENTYL) 20 MG tablet Take 1 tablet (20 mg total) by mouth every 6 (six) hours. 40 tablet 0  . nicotine (NICODERM CQ - DOSED IN MG/24 HOURS) 14 mg/24hr patch Place 1 patch (14 mg total) onto the skin daily. Start after 21 mg patches done 28 patch 1  . nicotine (NICODERM CQ - DOSED IN MG/24 HR) 7 mg/24hr patch Place 1 patch (7 mg total) onto the skin daily. Start after 14 mg patches complete 28 patch 1  . ondansetron (ZOFRAN) 8 MG tablet Take 1 tablet (8 mg total) by mouth every 8 (eight) hours as needed for nausea or vomiting. 30 tablet 0  . PARoxetine (PAXIL) 40 MG tablet Take 1 tablet (40 mg total) by mouth every morning. 30 tablet 1  . ranitidine (ZANTAC) 300 MG tablet Take 1 tablet (300 mg total) by mouth daily as needed for heartburn. 30 tablet 2   No facility-administered medications prior to visit.    Allergies  Allergen Reactions  . Other Other (See Comments)    HOUSE DUST, sneezing and runny nose    Review of Systems  Constitutional: Positive for malaise/fatigue. Negative for fever.  HENT: Negative for congestion.   Eyes: Negative for blurred vision.  Respiratory: Negative for shortness of breath.   Cardiovascular: Negative for chest pain, palpitations and leg swelling.  Gastrointestinal: Negative for abdominal pain, blood in stool and nausea.  Genitourinary: Negative for dysuria and frequency.  Musculoskeletal: Negative for falls.  Skin: Negative for rash.  Neurological: Negative for dizziness, loss of consciousness and headaches.  Endo/Heme/Allergies: Negative for environmental allergies.  Psychiatric/Behavioral: Positive for depression. The patient is nervous/anxious.        Objective:    Physical Exam Vitals and nursing note reviewed.  Constitutional:      General: She is not in acute distress.    Appearance: She is well-developed.  HENT:      Head: Normocephalic and atraumatic.     Nose: Nose normal.  Eyes:     General:        Right eye: No discharge.        Left eye: No discharge.  Neck:     Comments: Mildly diffuse thyroid enlarged.  Cardiovascular:     Rate and Rhythm: Normal rate and regular rhythm.     Heart sounds: No murmur.  Pulmonary:     Effort: Pulmonary effort is normal.     Breath sounds:  Normal breath sounds.  Abdominal:     General: Bowel sounds are normal.     Palpations: Abdomen is soft.     Tenderness: There is no abdominal tenderness.  Musculoskeletal:     Cervical back: Normal range of motion and neck supple.  Skin:    General: Skin is warm and dry.  Neurological:     Mental Status: She is alert and oriented to person, place, and time.     BP 138/88 (BP Location: Left Arm, Cuff Size: Normal)   Pulse 97   Temp 98.4 F (36.9 C) (Temporal)   Resp 12   Ht 5\' 6"  (1.676 m)   Wt 185 lb 3.2 oz (84 kg)   LMP 10/08/2019 (Within Days)   SpO2 99%   BMI 29.89 kg/m  Wt Readings from Last 3 Encounters:  11/21/19 185 lb 3.2 oz (84 kg)  11/20/17 182 lb 12 oz (82.9 kg)  09/09/17 193 lb (87.5 kg)    Diabetic Foot Exam - Simple   No data filed     Lab Results  Component Value Date   WBC 10.2 11/21/2019   HGB 14.4 11/21/2019   HCT 43.7 11/21/2019   PLT 365.0 11/21/2019   GLUCOSE 98 07/01/2017   CHOL 161 07/01/2017   TRIG 59.0 07/01/2017   HDL 61.70 07/01/2017   LDLCALC 88 07/01/2017   ALT 19 07/01/2017   AST 17 07/01/2017   NA 141 07/01/2017   K 4.4 07/01/2017   CL 107 07/01/2017   CREATININE 0.70 07/01/2017   BUN 10 07/01/2017   CO2 27 07/01/2017   TSH 3.52 11/21/2019    Lab Results  Component Value Date   TSH 3.52 11/21/2019   Lab Results  Component Value Date   WBC 10.2 11/21/2019   HGB 14.4 11/21/2019   HCT 43.7 11/21/2019   MCV 88.1 11/21/2019   PLT 365.0 11/21/2019   Lab Results  Component Value Date   NA 141 07/01/2017   K 4.4 07/01/2017   CO2 27 07/01/2017    GLUCOSE 98 07/01/2017   BUN 10 07/01/2017   CREATININE 0.70 07/01/2017   BILITOT 0.3 07/01/2017   ALKPHOS 54 07/01/2017   AST 17 07/01/2017   ALT 19 07/01/2017   PROT 7.9 07/01/2017   ALBUMIN 4.3 07/01/2017   CALCIUM 9.5 07/01/2017   GFR 130.43 07/01/2017   Lab Results  Component Value Date   CHOL 161 07/01/2017   Lab Results  Component Value Date   HDL 61.70 07/01/2017   Lab Results  Component Value Date   LDLCALC 88 07/01/2017   Lab Results  Component Value Date   TRIG 59.0 07/01/2017   Lab Results  Component Value Date   CHOLHDL 3 07/01/2017   No results found for: HGBA1C     Assessment & Plan:   Problem List Items Addressed This Visit    Depression with anxiety    With poor concentration, mood swings and irritability. She agrees to referral to psychiatry for evaluation due to the complexity of her symptoms.       Tobacco use    Unfortunately she coninues to smoke a ppd. She is not ready to attempt cessation but is encouraged to try and miniize      Preventative health care    Patient encouraged to maintain heart healthy diet, regular exercise, adequate sleep. Consider daily probiotics. Take medications as prescribed. She follows with Dr 08/29/2017 of GYN for her paps.       Hypertension  Well controlled without regular use of Metoprolol. She is encouraged to take the medication.       Breast mass in female    She noted a spot in her right breast this past year and it has not resolved. Mildly tender with palpation. Ultrasound is ordered to further evaluate      Thyromegaly    Mild, no palpable nodules. Normal thyroid labs and asymptomatic so  Patient will report if any new developments so then we can consider ultrasound.       Relevant Orders   TSH (Completed)   High risk sexual behavior   Relevant Orders   Urine cytology ancillary only([place])   HSV 2 antibody, IgG (Completed)   HIV Antibody (routine testing w rflx) (Completed)   RPR  (Completed)   Abnormal hemoglobin (Hgb) (H[facility])    Normal on repeat.       Relevant Orders   CBC (Completed)    Other Visit Diagnoses    Anxiety    -  Primary   Relevant Orders   Ambulatory referral to Psychiatry   Poor concentration       Relevant Orders   Ambulatory referral to Psychiatry   Depression, recurrent [facility])       Relevant Orders   Ambulatory referral to Psychiatry      I have discontinued [place] S. Kyer's ranitidine, nicotine, nicotine, PARoxetine, ondansetron, and dicyclomine. I am also having her maintain her multivitamin, Levonorgestrel-Ethinyl Estradiol, LORazepam, and metoprolol tartrate.  No orders of the defined types were placed in this encounter.    Danise Edge, MD

## 2019-11-22 NOTE — Assessment & Plan Note (Signed)
Well controlled without regular use of Metoprolol. She is encouraged to take the medication.

## 2019-11-22 NOTE — Assessment & Plan Note (Signed)
Patient encouraged to maintain heart healthy diet, regular exercise, adequate sleep. Consider daily probiotics. Take medications as prescribed. She follows with Dr Lowell Guitar of GYN for her paps.

## 2019-11-22 NOTE — Assessment & Plan Note (Signed)
She noted a spot in her right breast this past year and it has not resolved. Mildly tender with palpation. Ultrasound is ordered to further evaluate

## 2019-11-22 NOTE — Assessment & Plan Note (Signed)
Unfortunately she coninues to smoke a ppd. She is not ready to attempt cessation but is encouraged to try and miniize

## 2019-11-22 NOTE — Assessment & Plan Note (Signed)
Normal on repeat. 

## 2019-11-22 NOTE — Assessment & Plan Note (Signed)
Mild, no palpable nodules. Normal thyroid labs and asymptomatic so  Patient will report if any new developments so then we can consider ultrasound.

## 2019-11-24 ENCOUNTER — Ambulatory Visit
Admission: RE | Admit: 2019-11-24 | Discharge: 2019-11-24 | Disposition: A | Discharge status: Home / Self Care | Payer: Self-pay | Source: Ambulatory Visit | Attending: Family Medicine | Admitting: Family Medicine

## 2019-11-24 ENCOUNTER — Other Ambulatory Visit: Payer: Self-pay

## 2019-11-24 DIAGNOSIS — N6310 Unspecified lump in the right breast, unspecified quadrant: Secondary | ICD-10-CM

## 2019-11-24 IMAGING — US US BREAST*R* LIMITED INC AXILLA
1 series · 4 of 4 positions shown · non-contrast
Comparison: None.

CLINICAL DATA: 29-year-old female presenting for evaluation of a
palpable lump in the retroareolar right breast.

EXAM:
ULTRASOUND OF THE RIGHT BREAST

[Series 1: us breast*right* limited inc axilla · 0.06mm/px · 4 of 4 slices shown]
[im 1/4]
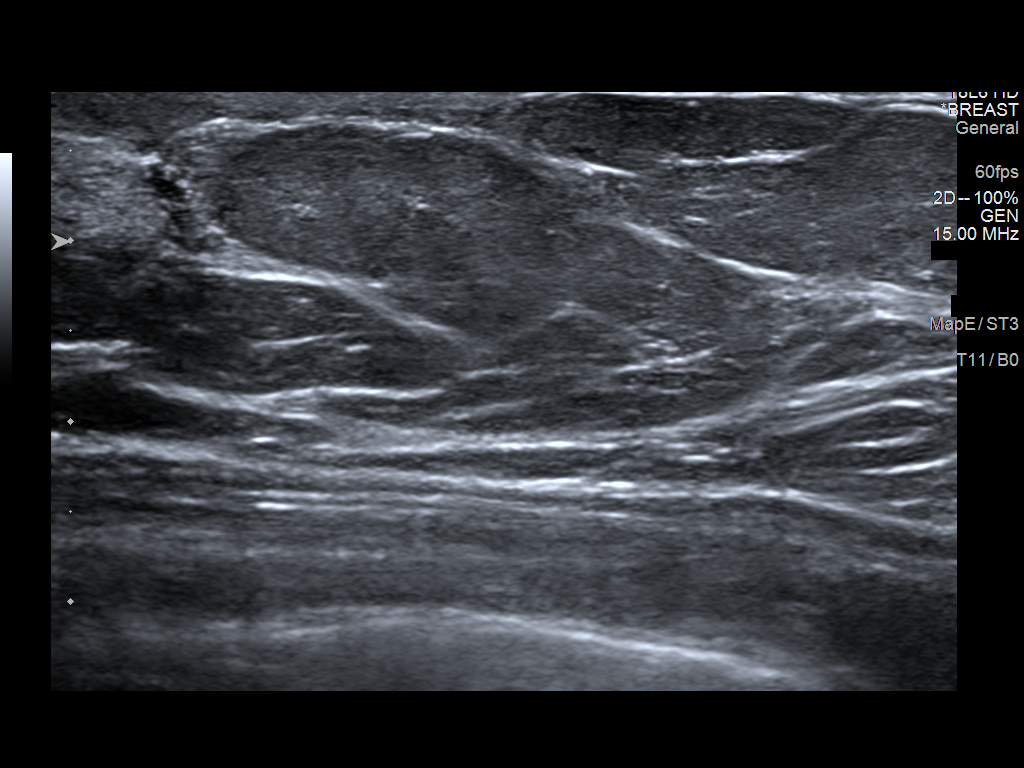
[im 2/4]
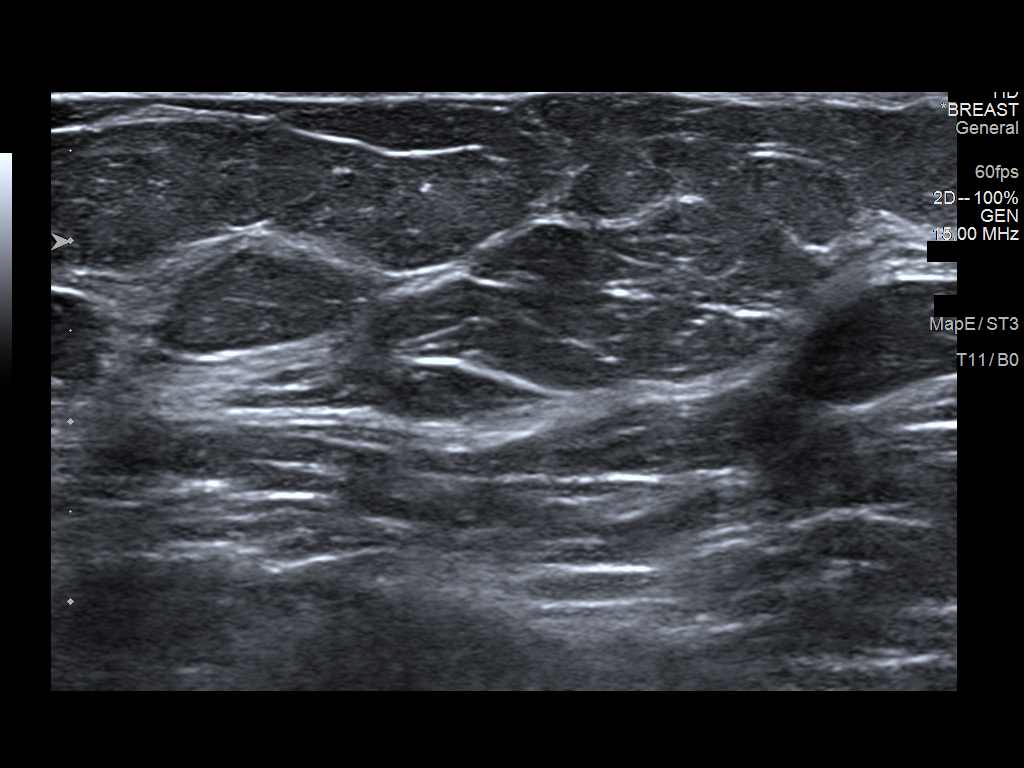
[im 3/4]
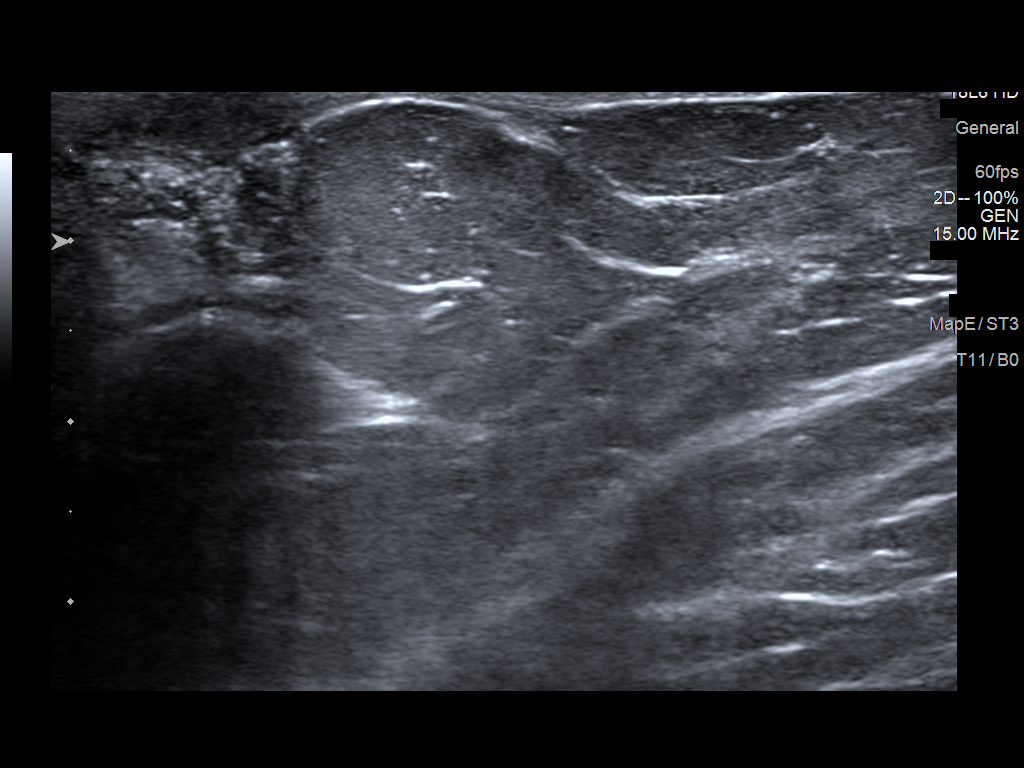
[im 4/4]
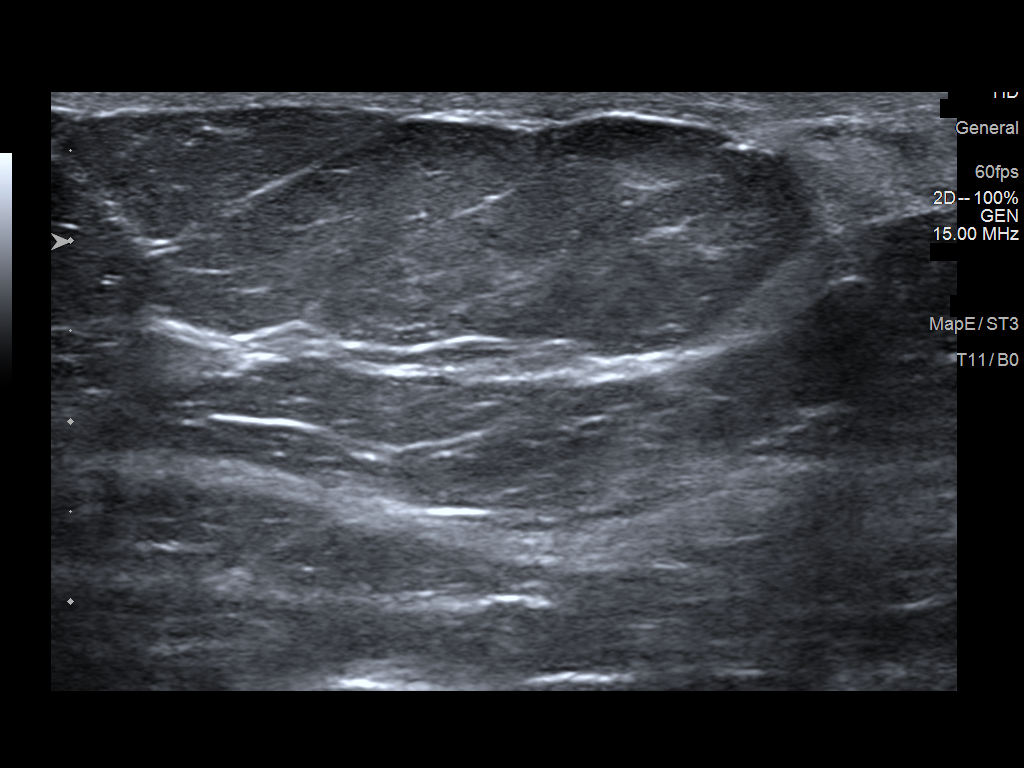

[4 of 4 positions shown; findings below may reference images not displayed]

FINDINGS: On physical exam, there is a firm ridge of tissue in the
retroareolar right breast without a discrete palpable mass.

Targeted ultrasound is performed, showing normal fibroglandular
tissue in the retroareolar right breast. No suspicious masses or
areas of shadowing are identified.
IMPRESSION: No suspicious targeted sonographic findings are identified in the
retroareolar right breast. Normal targeted ultrasound.

RECOMMENDATION:
1. Clinical follow-up recommended for the palpable area of concern
in the retroareolar right breast. Any further workup should be based
on clinical grounds.

2. Screening mammogram at age 40 unless there are persistent or
intervening clinical concerns. (Code:[BE])

I have discussed the findings and recommendations with the patient.
If applicable, a reminder letter will be sent to the patient
regarding the next appointment.

BI-RADS CATEGORY  1: Negative.

## 2019-11-27 LAB — URINE CYTOLOGY ANCILLARY ONLY
Bacterial Vaginitis-Urine: NEGATIVE
Candida Urine: NEGATIVE
Chlamydia: NEGATIVE
Comment: NEGATIVE
Comment: NEGATIVE
Comment: NORMAL
Neisseria Gonorrhea: NEGATIVE
Trichomonas: NEGATIVE

## 2019-12-25 ENCOUNTER — Encounter: Payer: Self-pay | Admitting: Family Medicine

## 2020-04-04 ENCOUNTER — Ambulatory Visit: Payer: Self-pay | Admitting: Family Medicine

## 2020-04-23 ENCOUNTER — Other Ambulatory Visit: Payer: Self-pay | Admitting: Family Medicine

## 2020-04-23 ENCOUNTER — Encounter: Payer: Self-pay | Admitting: Family Medicine

## 2020-04-23 MED ORDER — TRIAMCINOLONE ACETONIDE 0.1 % EX CREA: 1.0000 "application " | TOPICAL_CREAM | Freq: Two times a day (BID) | CUTANEOUS | 1 refills | Status: DC

## 2020-08-30 ENCOUNTER — Other Ambulatory Visit: Payer: Self-pay | Admitting: Family Medicine

## 2020-08-30 ENCOUNTER — Encounter: Payer: Self-pay | Admitting: Family Medicine

## 2020-08-30 MED ORDER — LORAZEPAM 0.5 MG PO TABS: 0.5000 mg | ORAL_TABLET | Freq: Two times a day (BID) | ORAL | 1 refills | Status: DC | PRN

## 2020-09-02 NOTE — Telephone Encounter (Signed)
Patient scheduled with Ria Clock on 10/15/20

## 2020-10-15 ENCOUNTER — Other Ambulatory Visit: Payer: Self-pay

## 2020-10-15 ENCOUNTER — Encounter: Payer: Self-pay | Admitting: Family

## 2020-10-15 ENCOUNTER — Ambulatory Visit (INDEPENDENT_AMBULATORY_CARE_PROVIDER_SITE_OTHER): Payer: Self-pay | Admitting: Family

## 2020-10-15 VITALS — BP 120/88 | HR 78 | Temp 98.0°F | Ht 66.0 in | Wt 195.0 lb

## 2020-10-15 DIAGNOSIS — Z720 Tobacco use: Secondary | ICD-10-CM

## 2020-10-15 DIAGNOSIS — B370 Candidal stomatitis: Secondary | ICD-10-CM

## 2020-10-15 LAB — BASIC METABOLIC PANEL
BUN: 13 mg/dL (ref 6–23)
CO2: 26 mEq/L (ref 19–32)
Calcium: 9.4 mg/dL (ref 8.4–10.5)
Chloride: 104 mEq/L (ref 96–112)
Creatinine, Ser: 0.63 mg/dL (ref 0.40–1.20)
GFR: 120.28 mL/min (ref >60.00)
Glucose, Bld: 91 mg/dL (ref 70–99)
Potassium: 4.1 mEq/L (ref 3.5–5.1)
Sodium: 138 mEq/L (ref 135–145)

## 2020-10-15 LAB — HEMOGLOBIN A1C: Hgb A1c MFr Bld: 5.9 % (ref 4.6–6.5)

## 2020-10-15 MED ORDER — FLUCONAZOLE 100 MG PO TABS: 100.0000 mg | ORAL_TABLET | Freq: Every day | ORAL | 0 refills | Status: DC

## 2020-10-15 MED ORDER — LEVONORGEST-ETH ESTRAD 91-DAY 0.15-0.03 &0.01 MG PO TABS: 1.0000 | ORAL_TABLET | Freq: Every day | ORAL | 0 refills | Status: DC

## 2020-10-15 MED ORDER — NYSTATIN 100000 UNIT/ML MT SUSP: 5.0000 mL | Freq: Four times a day (QID) | OROMUCOSAL | 0 refills | Status: DC

## 2020-10-15 NOTE — Progress Notes (Signed)
Beverly Pearson is a 30 y.o. female with the following history as recorded in [employer]:  Patient Active Problem List   Diagnosis Date Noted  . Breast mass in female 11/22/2019  . 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  . High risk sexual behavior 11/22/2019  . Abnormal hemoglobin (Hgb) (HCC) 11/22/2019  . Hypertension 08/04/2017  . Preventative health care 07/01/2017  . Leukocytosis 07/01/2017  . Tobacco use 12/25/2016  . Overweight 04/21/2015  . Vaginitis and vulvovaginitis 04/21/2015  . GERD (gastroesophageal reflux disease) 09/16/2011  . Insomnia 09/01/2011  . Depression with anxiety 03/03/2011  . Constipation 02/17/2011    Current Outpatient Medications  Medication Sig Dispense Refill  . fluconazole (DIFLUCAN) 100 MG tablet Take 1 tablet (100 mg total) by mouth daily. 7 tablet 0  . Levonorgestrel-Ethinyl Estradiol (SEASONIQUE) 0.15-0.03 &0.01 MG tablet Take 1 tablet by mouth daily. 1 Package 11  . LORazepam (ATIVAN) 0.5 MG tablet Take 1 tablet (0.5 mg total) by mouth 2 (two) times daily as needed for anxiety. 30 tablet 1  . metoprolol tartrate (LOPRESSOR) 25 MG tablet Take 1 tablet by mouth twice daily 60 tablet 0  . Multiple Vitamin (MULTIVITAMIN) tablet Take 1 tablet by mouth daily as needed.    . nystatin (MYCOSTATIN) 100000 UNIT/ML suspension Take 5 mLs (500,000 Units total) by mouth 4 (four) times daily. 150 mL 0  . triamcinolone cream (KENALOG) 0.1 % Apply 1 application topically 2 (two) times daily. 80 g 1   No current facility-administered medications for this visit.    Allergies: Other  Past Medical History:  Diagnosis Date  . Anxiety 02/17/2011  . Constipation   . Depression with anxiety 03/03/2011  . GERD (gastroesophageal reflux disease)   . Herpes simplex without mention of complication   . Insomnia 09/01/2011  . Overweight 04/21/2015  . Reflux 02/17/2011  . Reflux 03/03/2011  . Reflux 09/16/2011  . Tobacco use 12/25/2016    Past Surgical History:  Procedure Laterality Date  .  TONSILLECTOMY AND ADENOIDECTOMY     age 89    Family History  Problem Relation Age of Onset  . Diabetes Mother        type 2  . Hypertension Mother   . Diabetes Maternal Grandmother        type 2  . Cancer Maternal Grandmother 70       ovarian  . Hypertension Maternal Grandmother   . Kidney failure Maternal Grandfather   . Hypertension Maternal Grandfather   . Kidney disease Maternal Grandfather   . Diabetes Paternal Grandmother        type 2  . Hypertension Paternal Grandmother   . Emphysema Paternal Grandfather        smoke  . COPD Paternal Grandfather   . Cancer Paternal Grandfather 64       prostate cancer  . Colon cancer Neg Hx     Social History   Tobacco Use  . Smoking status: Current Every Day Smoker    Types: Cigarettes  . Smokeless tobacco: Never Used  . Tobacco comment: smokes 1 or 2 black and milds daily-  Substance Use Topics  . Alcohol use: Yes    Comment: occ    Subjective:  Patient presents with concerns for oral thrush- symptoms x 2-3 weeks; overdue to see dentist- thinks last appointment was July 2021; is daily smoker as well; has had thrush in the past- dentist treated with liquid medication;  Is concerned about weight- has gained 10 pounds since last OV in June 2021;  readily admits that not eating as well as she should; trying to quit smoking and feels this has made her want to snack more. Wants to discuss weight loss options; PCP recommended not continuing using Phentermine; does not have health insurance so medication options are limited as well;     Objective:  Vitals:   10/15/20 1050  BP: 120/88  Pulse: 78  Temp: 98 F (36.7 C)  TempSrc: Oral  SpO2: 98%  Weight: 195 lb (88.5 kg)  Height: 5\' 6"  (1.676 m)    General: Well developed, well nourished, in no acute distress  Skin : Warm and dry.  Head: Normocephalic and atraumatic  Eyes: Sclera and conjunctiva clear; pupils round and reactive to light; extraocular movements intact  Ears:  External normal; canals clear; tympanic membranes normal  Oropharynx: Pink, supple. C/w thrush Neck: Supple without thyromegaly, adenopathy  Lungs: Respirations unlabored;  Neurologic: Alert and oriented; speech intact; face symmetrical; moves all extremities well; CNII-XII intact without focal deficit   Assessment:  1. Oral thrush   2. Class 2 severe obesity with serious comorbidity in adult, unspecified BMI, unspecified obesity type (HCC)   3. Tobacco use     Plan:  1. Rx for Diflucan and oral Nystatin given; she will see which medication is cheaper and understands she only needs one medication; 2. Due to recurrent thrush, will check BMP, Hgba1c; may be a candidate for Metformin; Saxenda or Wegovy could be considered as well but might be cost prohibitive; refer to healthy weight and wellness as well- she will benefit from comprehensive weight loss program;  3. Continue working on goal of quitting smoking;   This visit occurred during the SARS-CoV-2 public health emergency.  Safety protocols were in place, including screening questions prior to the visit, additional usage of staff PPE, and extensive cleaning of exam room while observing appropriate contact time as indicated for disinfecting solutions.     No follow-ups on file.  Orders Placed This Encounter  Procedures  . Basic Metabolic Panel (BMET)  . Hemoglobin A1c  . Amb Ref to Medical Weight Management    Referral Priority:   Routine    Referral Type:   Consultation    Number of Visits Requested:   1    Requested Prescriptions   Signed Prescriptions Disp Refills  . fluconazole (DIFLUCAN) 100 MG tablet 7 tablet 0    Sig: Take 1 tablet (100 mg total) by mouth daily.  nystatin (MYCOSTATIN) 100000 UNIT/ML suspension 150 mL 0    Sig: Take 5 mLs (500,000 Units total) by mouth 4 (four) times daily.

## 2020-10-15 NOTE — Telephone Encounter (Signed)
Pt has not been seeing OBGYN lately and she is requesting this birth control. I have informed her that I can route the message to her PCP and double check if it is okay to send since she is also self pay.   She has not gotten this medication from [place] in the past.

## 2020-10-15 NOTE — Patient Instructions (Signed)
Oral Thrush, Adult Oral thrush is an infection in your mouth and throat and on your tongue. It causes white patches to form in your mouth and on your tongue. Many cases of thrush are mild. But, sometimes, thrush can be serious. People who have a weak body defense system (immune system) or other diseases can be affected more. What are the causes? This condition is caused by a type of fungus called yeast. The fungus is normally present in small amounts in the mouth and nose. If a person has a Stan-term illness or a weak body defense system, the fungus can grow and spread quickly. This causes thrush. What increases the risk? You are more likely to develop this condition if:  You have a weak body defense system.  You are an older adult.  You have diabetes, cancer, or HIV.  You have a dry mouth.  You are pregnant or breastfeeding.  You do not take good care of your teeth. This risk is greater for people who have false teeth (dentures).  You use antibiotic or steroid medicines. What are the signs or symptoms? Symptoms of this condition include:  A burning feeling in the mouth and throat.  White patches that stick to the mouth and tongue.  A bad taste in the mouth or trouble tasting foods.  A feeling like you have cotton in your mouth.  Pain when you eat and swallow.  Not wanting to eat as much as usual.  Cracking at the corners of the mouth.   How is this treated? This condition is treated with medicines called antifungals. These medicines prevent a fungus from growing. The medicines are either put right on the area (topical) or swallowed (oral). Your doctor will also treat other problems that you may have, such as diabetes or HIV. Follow these instructions at home: Medicines  Take or use over-the-counter and prescription medicines only as told by your doctor.  Ask your doctor about an over-the-counter medicine called gentian violet. Helping with pain and soreness To lessen  your pain:  Drink cold liquids, like water and iced tea.  Eat frozen ice pops or frozen juices.  Eat foods that are easy to swallow, like gelatin and ice cream.  Drink from a straw if you have too much pain in your mouth.   General instructions  Eat plain yogurt that has live cultures in it. Read the label to make sure that there are live cultures in your yogurt.  If you wear false teeth: ? Take them out before you go to bed. ? Brush them well. ? Soak them in a cleaner.  Rinse your mouth with warm salt-water many times a day. To make the salt-water mixture, dissolve -1 teaspoon (3-6 g) of salt in 1 cup (237 mL) of warm water. Contact a doctor if:  Your problems do not get better within 7 days of treatment.  Your infection is spreading. This may show as white areas on the skin outside of your mouth.  You are nursing your baby and you have redness and pain in the nipples. Summary  Oral thrush is an infection in your mouth and throat. It is caused by a fungus.  You are more likely to get this condition if you have a weak body defense system. Diseases like diabetes, cancer, or HIV also add to your risk.  This condition is treated with medicines called antifungals.  Contact a doctor if you do not get better within 7 days of starting treatment. This information   is not intended to replace advice given to you by your health care provider. Make sure you discuss any questions you have with your health care provider. Document Revised: 04/14/2019 Document Reviewed: 04/14/2019 Elsevier Patient Education  2021 Elsevier Inc.  

## 2020-10-16 ENCOUNTER — Telehealth: Payer: Self-pay

## 2020-10-16 NOTE — Telephone Encounter (Signed)
Sent pt message on my chart

## 2020-10-16 NOTE — Telephone Encounter (Signed)
Pt has called back and she stated that she would like to try the Saxenda first to see if she can. If that ends up being too much then she will try the metformin.

## 2020-10-16 NOTE — Telephone Encounter (Signed)
Sent a message on mychart to schedule appointment

## 2020-10-17 MED ORDER — PEN NEEDLES 32G X 4 MM MISC: 1 refills | Status: DC

## 2020-10-17 MED ORDER — SAXENDA 18 MG/3ML ~~LOC~~ SOPN: PEN_INJECTOR | SUBCUTANEOUS | 0 refills | Status: DC

## 2020-10-17 NOTE — Addendum Note (Signed)
Addended by: Lisbeth Renshaw, Nixie Laube HUA on: 10/17/2020 12:10 PM   Modules accepted: Orders

## 2020-10-17 NOTE — Telephone Encounter (Signed)
Will send in the Saxenda- let us know if she is able to start it so we can determine follow up;

## 2020-10-17 NOTE — Addendum Note (Signed)
Addended by: Eustace Moore on: 10/17/2020 09:25 AM   Modules accepted: Orders

## 2020-10-17 NOTE — Telephone Encounter (Signed)
I have called pt back to relay the message from the provider. No answer so I left a message to call back.

## 2020-11-14 ENCOUNTER — Encounter: Payer: Self-pay | Admitting: Family Medicine

## 2020-11-15 ENCOUNTER — Telehealth: Payer: Self-pay

## 2020-11-15 ENCOUNTER — Other Ambulatory Visit: Payer: Self-pay | Admitting: Family Medicine

## 2020-11-15 MED ORDER — NYSTATIN 100000 UNIT/ML MT SUSP: 5.0000 mL | Freq: Four times a day (QID) | OROMUCOSAL | 0 refills | Status: DC

## 2020-11-15 MED ORDER — LEVONORGEST-ETH ESTRAD 91-DAY 0.15-0.03 &0.01 MG PO TABS: 1.0000 | ORAL_TABLET | Freq: Every day | ORAL | 0 refills | Status: DC

## 2020-11-15 MED ORDER — FLUCONAZOLE 100 MG PO TABS: 100.0000 mg | ORAL_TABLET | Freq: Every day | ORAL | 0 refills | Status: DC

## 2020-11-15 NOTE — Telephone Encounter (Signed)
Called pt to get set up for pap appointment.

## 2020-11-25 ENCOUNTER — Encounter: Payer: Self-pay | Admitting: Family Medicine

## 2020-11-25 ENCOUNTER — Ambulatory Visit (INDEPENDENT_AMBULATORY_CARE_PROVIDER_SITE_OTHER): Payer: Self-pay | Admitting: Family Medicine

## 2020-11-25 ENCOUNTER — Encounter: Payer: Self-pay | Admitting: *Deleted

## 2020-11-25 ENCOUNTER — Other Ambulatory Visit: Payer: Self-pay

## 2020-11-25 DIAGNOSIS — K219 Gastro-esophageal reflux disease without esophagitis: Secondary | ICD-10-CM

## 2020-11-25 DIAGNOSIS — I10 Essential (primary) hypertension: Secondary | ICD-10-CM

## 2020-11-25 DIAGNOSIS — N946 Dysmenorrhea, unspecified: Secondary | ICD-10-CM

## 2020-11-25 MED ORDER — LEVONORGEST-ETH ESTRAD 91-DAY 0.15-0.03 &0.01 MG PO TABS: 1.0000 | ORAL_TABLET | Freq: Every day | ORAL | 3 refills | Status: DC

## 2020-11-25 MED ORDER — METOPROLOL TARTRATE 25 MG PO TABS: 25.0000 mg | ORAL_TABLET | Freq: Two times a day (BID) | ORAL | 5 refills | Status: DC

## 2020-11-25 NOTE — Patient Instructions (Signed)
Sinus Tachycardia  Sinus tachycardia is a kind of fast heartbeat. In sinus tachycardia, the heart beats more than 100 times a minute. Sinus tachycardia starts in a part of the heart called the sinus node. Sinus tachycardia may be harmless, or it may be a sign of a serious condition. What are the causes? This condition may be caused by:  Exercise or exertion.  A fever.  Pain.  Loss of body fluids (dehydration).  Severe bleeding (hemorrhage).  Anxiety and stress.  Certain substances, including: ? Alcohol. ? Caffeine. ? Tobacco and nicotine products. ? Cold medicines. ? Illegal drugs.  Medical conditions including: ? Heart disease. ? An infection. ? An overactive thyroid (hyperthyroidism). ? A lack of red blood cells (anemia). What are the signs or symptoms? Symptoms of this condition include:  A feeling that the heart is beating quickly (palpitations).  Suddenly noticing your heartbeat (cardiac awareness).  Dizziness.  Tiredness (fatigue).  Shortness of breath.  Chest pain.  Nausea.  Fainting. How is this diagnosed? This condition is diagnosed with:  A physical exam.  Other tests, such as: ? Blood tests. ? An electrocardiogram (ECG). This test measures the electrical activity of the heart. ? Ambulatory cardiac monitor. This records your heartbeats for 24 hours or more. You may be referred to a heart specialist (cardiologist). How is this treated? Treatment for this condition depends on the cause or the underlying condition. Treatment may involve:  Treating the underlying condition.  Taking new medicines or changing your current medicines as told by your health care provider.  Making changes to your diet or lifestyle. Follow these instructions at home: Lifestyle  Do not use any products that contain nicotine or tobacco, such as cigarettes and e-cigarettes. If you need help quitting, ask your health care provider.  Do not use illegal drugs, such as  cocaine.  Learn relaxation methods to help you when you get stressed or anxious. These include deep breathing.  Avoid caffeine or other stimulants.   Alcohol use  Do not drink alcohol if: ? Your health care provider tells you not to drink. ? You are pregnant, may be pregnant, or are planning to become pregnant.  If you drink alcohol, limit how much you have: ? 0-1 drink a day for women. ? 0-2 drinks a day for men.  Be aware of how much alcohol is in your drink. In the [place] one drink equals one typical bottle of beer (12 oz), one-half glass of wine (5 oz), or one shot of hard liquor (1 oz).   General instructions  Drink enough fluids to keep your urine pale yellow.  Take over-the-counter and prescription medicines only as told by your health care provider.  Keep all follow-up visits as told by your health care provider. This is important. Contact a health care provider if you have:  A fever.  Vomiting or diarrhea that does not go away. Get help right away if you:  Have pain in your chest, upper arms, jaw, or neck.  Become weak or dizzy.  Feel faint.  Have palpitations that do not go away. Summary  In sinus tachycardia, the heart beats more than 100 times a minute.  Sinus tachycardia may be harmless, or it may be a sign of a serious condition.  Treatment for this condition depends on the cause or the underlying condition.  Get help right away if you have pain in your chest, upper arms, jaw, or neck. This information is not intended to replace advice given to   you by your health care provider. Make sure you discuss any questions you have with your health care provider. Document Revised: 07/28/2017 Document Reviewed: 07/28/2017 Elsevier Patient Education  2021 Elsevier Inc.  

## 2020-11-26 ENCOUNTER — Encounter: Payer: Self-pay | Admitting: *Deleted

## 2020-11-30 NOTE — Assessment & Plan Note (Signed)
Given refillon her OCP

## 2020-11-30 NOTE — Assessment & Plan Note (Signed)
She is out of her Metoprolol so we will restart it. She will monitor and let us know her numbers.

## 2020-11-30 NOTE — Progress Notes (Signed)
Subjective:    Patient ID: Beverly Pearson, female    DOB: 09/12/1990, 30 y.o.   MRN: [id number]  Chief Complaint  Patient presents with   Follow-up   Anxiety   Depression    HPI Patient is in today for follow up on chronic medical concerns including hypertension, dysmenorrhea and reflux. She is feeling well today. No recent febrile illness or hospitalizations. She notes some dyspepsia at times but no nausea or vomiting. Denies CP/palp/SOB/HA/congestion/fevers/GI or GU c/o. Taking meds as prescribed   Past Medical History:  Diagnosis Date   Anxiety 02/17/2011   Constipation    Depression with anxiety 03/03/2011   GERD (gastroesophageal reflux disease)    Herpes simplex without mention of complication    Insomnia 09/01/2011   Overweight 04/21/2015   Reflux 02/17/2011   Reflux 03/03/2011   Reflux 09/16/2011   Tobacco use 12/25/2016    Past Surgical History:  Procedure Laterality Date   TONSILLECTOMY AND ADENOIDECTOMY     age 89    Family History  Problem Relation Age of Onset   Diabetes Mother        type 2   Hypertension Mother    Diabetes Maternal Grandmother        type 2   Cancer Maternal Grandmother 32       ovarian   Hypertension Maternal Grandmother    Kidney failure Maternal Grandfather    Hypertension Maternal Grandfather    Kidney disease Maternal Grandfather    Diabetes Paternal Grandmother        type 2   Hypertension Paternal Grandmother    Emphysema Paternal Grandfather        smoke   COPD Paternal Grandfather    Cancer Paternal Grandfather 54       prostate cancer   Colon cancer Neg Hx     Social History   Socioeconomic History   Marital status: Single    Spouse name: Not on file   Number of children: 0   Years of education: Not on file   Highest education level: Not on file  Occupational History   Occupation: Unemployed  Tobacco Use   Smoking status: Every Day    Pack years: 0.00    Types: Cigarettes   Smokeless tobacco: Never   Tobacco  comments:    smokes 1 or 2 black and milds daily-  Vaping Use   Vaping Use: Never used  Substance and Sexual Activity   Alcohol use: Yes    Comment: occ   Drug use: Yes    Types: Marijuana   Sexual activity: Never    Birth control/protection: Pill  Other Topics Concern   Not on file  Social History Narrative   Occasional soda    Social Determinants of Health   Financial Resource Strain: Not on file  Food Insecurity: Not on file  Transportation Needs: Not on file  Physical Activity: Not on file  Stress: Not on file  Social Connections: Not on file  Intimate Partner Violence: Not on file    Outpatient Medications Prior to Visit  Medication Sig Dispense Refill   fluconazole (DIFLUCAN) 100 MG tablet Take 1 tablet (100 mg total) by mouth daily. 7 tablet 0   LORazepam (ATIVAN) 0.5 MG tablet Take 1 tablet (0.5 mg total) by mouth 2 (two) times daily as needed for anxiety. 30 tablet 1   Multiple Vitamin (MULTIVITAMIN) tablet Take 1 tablet by mouth daily as needed.     nystatin (MYCOSTATIN) 100000 UNIT/ML  suspension Take 5 mLs (500,000 Units total) by mouth 4 (four) times daily. 150 mL 0   triamcinolone cream (KENALOG) 0.1 % Apply 1 application topically 2 (two) times daily. 80 g 1   Levonorgestrel-Ethinyl Estradiol (SEASONIQUE) 0.15-0.03 &0.01 MG tablet Take 1 tablet by mouth daily. 91 tablet 0   metoprolol tartrate (LOPRESSOR) 25 MG tablet Take 1 tablet by mouth twice daily 60 tablet 0   Insulin Pen Needle (PEN NEEDLES) 32G X 4 MM MISC Use once daily with SAXENDA 100 each 1   Liraglutide -Weight Management (SAXENDA) 18 MG/3ML SOPN 0.6 mg [place] qd x1wk, then incr. dose by 0.6 mg/day qwk to target 3 mg [place] qd; 3 mL 0   No facility-administered medications prior to visit.    Allergies  Allergen Reactions   Other Other (See Comments)    HOUSE DUST, sneezing and runny nose    Review of Systems  Constitutional:  Positive for malaise/fatigue. Negative for fever.  HENT:  Negative for  congestion.   Eyes:  Negative for blurred vision.  Respiratory:  Negative for shortness of breath.   Cardiovascular:  Negative for chest pain, palpitations and leg swelling.  Gastrointestinal:  Positive for heartburn. Negative for abdominal pain, blood in stool and nausea.  Genitourinary:  Negative for dysuria and frequency.  Musculoskeletal:  Negative for falls.  Skin:  Negative for rash.  Neurological:  Negative for dizziness, loss of consciousness and headaches.  Endo/Heme/Allergies:  Negative for environmental allergies.  Psychiatric/Behavioral:  Negative for depression. The patient is not nervous/anxious.       Objective:    Physical Exam Vitals and nursing note reviewed.  Constitutional:      General: She is not in acute distress.    Appearance: She is well-developed.  HENT:     Head: Normocephalic and atraumatic.     Nose: Nose normal.  Eyes:     General:        Right eye: No discharge.        Left eye: No discharge.  Cardiovascular:     Rate and Rhythm: Normal rate and regular rhythm.     Heart sounds: No murmur heard. Pulmonary:     Effort: Pulmonary effort is normal.     Breath sounds: Normal breath sounds.  Abdominal:     General: Bowel sounds are normal.     Palpations: Abdomen is soft.     Tenderness: There is no abdominal tenderness.  Musculoskeletal:     Cervical back: Normal range of motion and neck supple.  Skin:    General: Skin is warm and dry.  Neurological:     Mental Status: She is alert and oriented to person, place, and time.    BP (!) 156/96   Pulse (!) 110   Temp 98.7 F (37.1 C)   Resp 16   Wt 185 lb 12.8 oz (84.3 kg)   SpO2 99%   BMI 29.99 kg/m  Wt Readings from Last 3 Encounters:  11/25/20 185 lb 12.8 oz (84.3 kg)  10/15/20 195 lb (88.5 kg)  11/21/19 185 lb 3.2 oz (84 kg)    Diabetic Foot Exam - Simple   No data filed    Lab Results  Component Value Date   WBC 10.2 11/21/2019   HGB 14.4 11/21/2019   HCT 43.7 11/21/2019    PLT 365.0 11/21/2019   GLUCOSE 91 10/15/2020   CHOL 161 07/01/2017   TRIG 59.0 07/01/2017   HDL 61.70 07/01/2017   LDLCALC 88 07/01/2017  ALT 19 07/01/2017   AST 17 07/01/2017   NA 138 10/15/2020   K 4.1 10/15/2020   CL 104 10/15/2020   CREATININE 0.63 10/15/2020   BUN 13 10/15/2020   CO2 26 10/15/2020   TSH 3.52 11/21/2019   HGBA1C 5.9 10/15/2020    Lab Results  Component Value Date   TSH 3.52 11/21/2019   Lab Results  Component Value Date   WBC 10.2 11/21/2019   HGB 14.4 11/21/2019   HCT 43.7 11/21/2019   MCV 88.1 11/21/2019   PLT 365.0 11/21/2019   Lab Results  Component Value Date   NA 138 10/15/2020   K 4.1 10/15/2020   CO2 26 10/15/2020   GLUCOSE 91 10/15/2020   BUN 13 10/15/2020   CREATININE 0.63 10/15/2020   BILITOT 0.3 07/01/2017   ALKPHOS 54 07/01/2017   AST 17 07/01/2017   ALT 19 07/01/2017   PROT 7.9 07/01/2017   ALBUMIN 4.3 07/01/2017   CALCIUM 9.4 10/15/2020   GFR 120.28 10/15/2020   Lab Results  Component Value Date   CHOL 161 07/01/2017   Lab Results  Component Value Date   HDL 61.70 07/01/2017   Lab Results  Component Value Date   LDLCALC 88 07/01/2017   Lab Results  Component Value Date   TRIG 59.0 07/01/2017   Lab Results  Component Value Date   CHOLHDL 3 07/01/2017   Lab Results  Component Value Date   HGBA1C 5.9 10/15/2020       Assessment & Plan:   Problem List Items Addressed This Visit     GERD (gastroesophageal reflux disease)    Avoid offending foods, start probiotics. Do not eat large meals in late evening and consider raising head of bed.        Hypertension    She is out of her Metoprolol so we will restart it. She will monitor and let us know her numbers.        Relevant Medications   metoprolol tartrate (LOPRESSOR) 25 MG tablet   Dysmenorrhea    Given refillon her OCP        I have discontinued Beverly S. Rakers's Saxenda and Pen Needles. I have also changed her metoprolol tartrate.  Additionally, I am having her maintain her multivitamin, triamcinolone cream, LORazepam, nystatin, fluconazole, and Levonorgestrel-Ethinyl Estradiol.  Meds ordered this encounter  Medications   Levonorgestrel-Ethinyl Estradiol (SEASONIQUE) 0.15-0.03 &0.01 MG tablet    Sig: Take 1 tablet by mouth daily.    Dispense:  91 tablet    Refill:  3   metoprolol tartrate (LOPRESSOR) 25 MG tablet    Sig: Take 1 tablet (25 mg total) by mouth 2 (two) times daily.    Dispense:  60 tablet    Refill:  5     Danise Edge, MD

## 2020-11-30 NOTE — Assessment & Plan Note (Signed)
Avoid offending foods, start probiotics. Do not eat large meals in late evening and consider raising head of bed.  

## 2020-12-01 ENCOUNTER — Encounter: Payer: Self-pay | Admitting: Family Medicine

## 2020-12-02 ENCOUNTER — Telehealth: Payer: Self-pay

## 2020-12-02 NOTE — Telephone Encounter (Signed)
Spoke with pt she is doing and I sent mychart for request for more anxiety medication over to Dr. Abner Greenspan

## 2020-12-02 NOTE — Telephone Encounter (Signed)
Patient Name: Beverly Pearson Gender: Female DOB: 09/12/1990 Age: 30 Y 27 D Return Phone Number: [phone] (Primary) Address: City/ State/ Zip: [place]  [address] Client [facility] [place] Night - Client Client Site [facility] [place] - Night Physician Danise Edge - MD Contact Type Call Who Is Calling Patient / Member / Family / Caregiver Call Type Triage / Clinical Relationship To Patient Self Return Phone Number [phone] (Primary) Chief Complaint Vomiting Reason for Call Symptomatic / Request for Health Information Initial Comment Caller has been vomiting. Has been going on for a couple of days. Nausea. Negative for Covid. Translation No Nurse Assessment Nurse: Mayford Knife, RN, Teresa Coombs Date/Time Lamount Cohen Time): 11/30/2020 6:45:23 AM Confirm and document reason for call. If symptomatic, describe symptoms. ---Caller states she vomited once yesterday and once today. No appetite since Tuesday. Nausea. Does the patient have any new or worsening symptoms? ---Yes Will a triage be completed? ---Yes Related visit to physician within the last 2 weeks? ---No Does the PT have any chronic conditions? (i.e. diabetes, asthma, this includes High risk factors for pregnancy, etc.) ---Yes List chronic conditions. ---HTN Is the patient pregnant or possibly pregnant? (Ask all females between the ages of 72-55) ---No Is this a behavioral health or substance abuse call? ---No Guidelines Guideline Title Affirmed Question Affirmed Notes Nurse Date/Time (Eastern Time) Vomiting [1] MILD or MODERATE vomiting AND [2] present > 48 hours (2 days) (Exception: mild vomiting with associated diarrhea) Mayford Knife, RN, Teresa Coombs 11/30/2020 6:47:14 AM

## 2020-12-02 NOTE — Telephone Encounter (Signed)
I called pt and gave her the address. She stated that she would go.

## 2020-12-02 NOTE — Telephone Encounter (Signed)
Called pt to check and see if she was doing ok due her vomiting for a few days.

## 2020-12-25 ENCOUNTER — Telehealth: Payer: Self-pay

## 2020-12-25 NOTE — Telephone Encounter (Signed)
Nurse Assessment Nurse: Merlene Pulling, RN, Lissa Date/Time (Eastern Time): 12/25/2020 1:28:53 PM Confirm and document reason for call. If symptomatic, describe symptoms. ---Caller states: Last BM was Sunday /Monday and was normal. She is having constipation. I haven't had constipation in past, but stopped smoking last month. Does the patient have any new or worsening symptoms? ---Yes Will a triage be completed? ---Yes Related visit to physician within the last 2 weeks? ---No Does the PT have any chronic conditions? (i.e. diabetes, asthma, this includes High risk factors for pregnancy, etc.) ---No Is the patient pregnant or possibly pregnant? (Ask all females between the ages of 64-55) ---No Is this a behavioral health or substance abuse call? ---No Guidelines Guideline Title Affirmed Question Affirmed Notes Nurse Date/Time (Eastern Time) Constipation Abdomen is more swollen than usual Hammonds, RN, Lissa 12/25/2020 1:30:29 PM Disp. Time Lamount Cohen Time) Disposition Final User 12/25/2020 1:33:43 PM See PCP within 24 Hours Yes Hammonds, RN, Lissa Caller Disagree/Comply Comply PLEASE NOTE: All timestamps contained within this report are represented as [place] Standard Time. CONFIDENTIALTY NOTICE: This fax transmission is intended only for the addressee. It contains information that is legally privileged, confidential or otherwise protected from use or disclosure. If you are not the intended recipient, you are strictly prohibited from reviewing, disclosing, copying using or disseminating any of this information or taking any action in reliance on or regarding this information. If you have received this fax in error, please notify us immediately by telephone so that we can arrange for its return to [place]. Phone: [phone], Toll-Free: [phone], Fax: [phone] Page: 2 of 2 Call Id: [id number] Caller Understands Yes PreDisposition Did not know what to do Care Advice Given Per Guideline SEE  PCP WITHIN 24 HOURS: * IF OFFICE WILL BE OPEN: You need to be examined within the next 24 hours. Call your doctor (or NP/PA) when the office opens and make an appointment. GENERAL CONSTIPATION INSTRUCTIONS: * Eat a high fiber diet. * Drink adequate liquids. FLEET ENEMA - STEP-BY-STEP INSTRUCTIONS: * STEP 1 - BODY POSITION. Patient should lie on left side. Keep the bottom leg (left) straight or slightly bent. The right knee should be flexed (bent upwards). * STEP 2 - ENEMA BOTTLE. Shake well and remove the cap from the enema bottle tip. FLEET ENEMA - EXTRA NOTES AND WARNINGS: * Do not use if there is fever, abdominal pain, or rectal bleeding. * Do not use if you have heart disease, kidney disease, or inflammatory bowel disease. * Do not use if you have neutropenia (very low white cell count) or thrombocytopenia (very low platelet count). * Do not use if you are pregnant. CALL BACK IF: * You become worse * Vomiting occurs * Severe or increasing abdomen pain CARE ADVICE given per Constipation (Adult) guideline. Comments User: Volanda Napoleon, RN Date/Time Lamount Cohen Time): 12/25/2020 1:31:12 PM I took laxative tea last night but nothing isn't coming out. Referrals REFERRED TO PCP OFFICE  Appt scheduled w/ Vernona Rieger tomorrow.

## 2020-12-27 ENCOUNTER — Ambulatory Visit: Payer: Self-pay | Admitting: Family

## 2021-01-22 ENCOUNTER — Other Ambulatory Visit: Payer: Self-pay | Admitting: Family Medicine

## 2021-01-23 NOTE — Telephone Encounter (Signed)
Requesting: lorazepam Contract: 11/25/20 UDS:  Last Visit: 11/25/20 Next Visit: 07/08/21 Last Refill: 08/30/20  Please Advise

## 2021-03-12 LAB — HM PAP SMEAR: HM Pap smear: NORMAL

## 2021-04-14 ENCOUNTER — Other Ambulatory Visit: Payer: Self-pay | Admitting: Family Medicine

## 2021-04-14 ENCOUNTER — Telehealth: Payer: Self-pay | Admitting: Family Medicine

## 2021-04-14 NOTE — Telephone Encounter (Signed)
Okay per Dr Patsy Lager

## 2021-04-14 NOTE — Telephone Encounter (Signed)
Patient is requestin CPE/Pap Beverly Pearson is booked until mid next year.. patient said it was fine if she could see another provider Can we get one set up with you? If that's okay   Please advise Ill get patient set up if its okay

## 2021-04-14 NOTE — Telephone Encounter (Signed)
Are you okay with this?

## 2021-04-15 NOTE — Telephone Encounter (Signed)
Requesting: lorazepam 0.5mg  Contract: 11/25/2020 UDS: 03/02/2011 Last Visit: 11/25/2020 Next Visit: 07/08/21 Last Refill: 01/24/2021 #30 and 0RF  Please Advise

## 2021-04-15 NOTE — Telephone Encounter (Signed)
I gave patient a call and LVM  Patient has a CPE set up with Abner Greenspan in January. I did not see this when she sent the mychart message for a appt request. I let patient know she had one scheduled already and we could totally keep that if she wanted to. If she wanted to be seen earlier with Dr copland, we could cancel blyth & set her up with copland before.

## 2021-04-16 ENCOUNTER — Telehealth: Payer: Self-pay

## 2021-04-16 NOTE — Telephone Encounter (Signed)
Nurse Assessment Nurse: Evonnie Dawes, RN, Cala Bradford Date/Time (Eastern Time): 04/16/2021 1:36:21 PM Confirm and document reason for call. If symptomatic, describe symptoms. ---Caller states she is having neck pain. The pain started about 2 months ago. Does the patient have any new or worsening symptoms? ---Yes Will a triage be completed? ---Yes Related visit to physician within the last 2 weeks? ---No Does the PT have any chronic conditions? (i.e. diabetes, asthma, this includes High risk factors for pregnancy, etc.) ---No Is the patient pregnant or possibly pregnant? (Ask all females between the ages of 69-55) ---No Is this a behavioral health or substance abuse call? ---No Guidelines Guideline Title Affirmed Question Affirmed Notes Nurse Date/Time Lamount Cohen Time) Neck Pain or Stiffness [1] MODERATE neck pain (e.g., interferes with normal activities) AND [2] present > 3 days Daves, RN, Cala Bradford 04/16/2021 1:37:14 PM Disp. Time Lamount Cohen Time) Disposition Final User 04/16/2021 1:42:34 PM SEE PCP WITHIN 3 DAYS Yes Daves, RN, Cala Bradford PLEASE NOTE: All timestamps contained within this report are represented as [place] Standard Time. CONFIDENTIALTY NOTICE: This fax transmission is intended only for the addressee. It contains information that is legally privileged, confidential or otherwise protected from use or disclosure. If you are not the intended recipient, you are strictly prohibited from reviewing, disclosing, copying using or disseminating any of this information or taking any action in reliance on or regarding this information. If you have received this fax in error, please notify us immediately by telephone so that we can arrange for its return to [place]. Phone: [phone], Toll-Free: [phone], Fax: [phone] Page: 2 of 2 Call Id: [id number] Caller Disagree/Comply Comply Caller Understands Yes PreDisposition Call Doctor Care Advice Given Per Guideline SEE PCP WITHIN 3  DAYS: * You need to be seen within 2 or 3 days. * ACETAMINOPHEN - EXTRA STRENGTH TYLENOL: Take 1,000 mg (two 500 mg pills) every 6 to 8 hours as needed. Each Extra Strength Tylenol pill has 500 mg of acetaminophen. The most you should take is 6 pills a day (3,000 mg total). Note: In [place], the maximum is 8 pills a day (4,000 mg total). * IBUPROFEN (E.G., MOTRIN, ADVIL): Take 400 mg (two 200 mg pills) by mouth every 6 hours. The most you should take is 6 pills a day (1,200 mg total). * Sleep on the back or side, not the abdomen. * Sleep with a neck collar. Use a foam neck collar (from a pharmacy) OR a small towel wrapped around the neck. Reason: Keep the head from moving too much during sleep. SLEEP: ACTIVITY: * Limit neck movement for 48 hours. * After 48 hours begin gentle stretching exercises. * Improve the strength of the neck muscles with 2 or 3 minutes of gentle stretching exercises each day. * Don't use any pressure when you do these stretching exercises. * Touch your chin to your shoulder, touch your ear to each shoulder, and move your head up and down. STRETCHING EXERCISES: * Avoid activity that puts stress on the neck. * Do not work with your neck turned or bent back. * Do not carry heavy objects on your head. CALL BACK IF: * You become worse CARE ADVICE given per Neck Pain (Adult) guideline. Referrals REFERRED TO PCP OFFICE  Appt scheduled w. PCP tomorrow

## 2021-04-17 ENCOUNTER — Other Ambulatory Visit: Payer: Self-pay

## 2021-04-17 ENCOUNTER — Encounter: Payer: Self-pay | Admitting: *Deleted

## 2021-04-17 ENCOUNTER — Ambulatory Visit (INDEPENDENT_AMBULATORY_CARE_PROVIDER_SITE_OTHER): Payer: Self-pay | Admitting: Family Medicine

## 2021-04-17 DIAGNOSIS — F418 Other specified anxiety disorders: Secondary | ICD-10-CM

## 2021-04-17 DIAGNOSIS — Z720 Tobacco use: Secondary | ICD-10-CM

## 2021-04-17 DIAGNOSIS — M542 Cervicalgia: Secondary | ICD-10-CM

## 2021-04-17 DIAGNOSIS — I10 Essential (primary) hypertension: Secondary | ICD-10-CM
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
## 2021-04-17 NOTE — Patient Instructions (Addendum)
Apply moist heat, then do neck stretch exercises, then apply lidocaine (voltaren or aspercreme) topical cream twice daily. Hydrogen peroxide to ears at least weekly, after the shower Hypertension, Adult High blood pressure (hypertension) is when the force of blood pumping through the arteries is too strong. The arteries are the blood vessels that carry blood from the heart throughout the body. Hypertension forces the heart to work harder to pump blood and may cause arteries to become narrow or stiff. Untreated or uncontrolled hypertension can cause a heart attack, heart failure, a stroke, kidney disease, and other problems. A blood pressure reading consists of a higher number over a lower number. Ideally, your blood pressure should be below 120/80. The first ("top") number is called the systolic pressure. It is a measure of the pressure in your arteries as your heart beats. The second ("bottom") number is called the diastolic pressure. It is a measure of the pressure in your arteries as the heart relaxes. What are the causes? The exact cause of this condition is not known. There are some conditions that result in or are related to high blood pressure. What increases the risk? Some risk factors for high blood pressure are under your control. The following factors may make you more likely to develop this condition: Smoking. Having type 2 diabetes mellitus, high cholesterol, or both. Not getting enough exercise or physical activity. Being overweight. Having too much fat, sugar, calories, or salt (sodium) in your diet. Drinking too much alcohol. Some risk factors for high blood pressure may be difficult or impossible to change. Some of these factors include: Having chronic kidney disease. Having a family history of high blood pressure. Age. Risk increases with age. Race. You may be at higher risk if you are African American. Gender. Men are at higher risk than women before age 66. After age 8, women  are at higher risk than men. Having obstructive sleep apnea. Stress. What are the signs or symptoms? High blood pressure may not cause symptoms. Very high blood pressure (hypertensive crisis) may cause: Headache. Anxiety. Shortness of breath. Nosebleed. Nausea and vomiting. Vision changes. Severe chest pain. Seizures. How is this diagnosed? This condition is diagnosed by measuring your blood pressure while you are seated, with your arm resting on a flat surface, your legs uncrossed, and your feet flat on the floor. The cuff of the blood pressure monitor will be placed directly against the skin of your upper arm at the level of your heart. It should be measured at least twice using the same arm. Certain conditions can cause a difference in blood pressure between your right and left arms. Certain factors can cause blood pressure readings to be lower or higher than normal for a short period of time: When your blood pressure is higher when you are in a health care provider's office than when you are at home, this is called white coat hypertension. Most people with this condition do not need medicines. When your blood pressure is higher at home than when you are in a health care provider's office, this is called masked hypertension. Most people with this condition may need medicines to control blood pressure. If you have a high blood pressure reading during one visit or you have normal blood pressure with other risk factors, you may be asked to: Return on a different day to have your blood pressure checked again. Monitor your blood pressure at home for 1 week or longer. If you are diagnosed with hypertension, you may have  other blood or imaging tests to help your health care provider understand your overall risk for other conditions. How is this treated? This condition is treated by making healthy lifestyle changes, such as eating healthy foods, exercising more, and reducing your alcohol intake.  Your health care provider may prescribe medicine if lifestyle changes are not enough to get your blood pressure under control, and if: Your systolic blood pressure is above 130. Your diastolic blood pressure is above 80. Your personal target blood pressure may vary depending on your medical conditions, your age, and other factors. Follow these instructions at home: Eating and drinking  Eat a diet that is high in fiber and potassium, and low in sodium, added sugar, and fat. An example eating plan is called the DASH (Dietary Approaches to Stop Hypertension) diet. To eat this way: Eat plenty of fresh fruits and vegetables. Try to fill one half of your plate at each meal with fruits and vegetables. Eat whole grains, such as whole-wheat pasta, brown rice, or whole-grain bread. Fill about one fourth of your plate with whole grains. Eat or drink low-fat dairy products, such as skim milk or low-fat yogurt. Avoid fatty cuts of meat, processed or cured meats, and poultry with skin. Fill about one fourth of your plate with lean proteins, such as fish, chicken without skin, beans, eggs, or tofu. Avoid pre-made and processed foods. These tend to be higher in sodium, added sugar, and fat. Reduce your daily sodium intake. Most people with hypertension should eat less than 1,500 mg of sodium a day. Do not drink alcohol if: Your health care provider tells you not to drink. You are pregnant, may be pregnant, or are planning to become pregnant. If you drink alcohol: Limit how much you use to: 0-1 drink a day for women. 0-2 drinks a day for men. Be aware of how much alcohol is in your drink. In the [place] one drink equals one 12 oz bottle of beer (355 mL), one 5 oz glass of wine (148 mL), or one 1 oz glass of hard liquor (44 mL). Lifestyle  Work with your health care provider to maintain a healthy body weight or to lose weight. Ask what an ideal weight is for you. Get at least 30 minutes of exercise most days  of the week. Activities may include walking, swimming, or biking. Include exercise to strengthen your muscles (resistance exercise), such as Pilates or lifting weights, as part of your weekly exercise routine. Try to do these types of exercises for 30 minutes at least 3 days a week. Do not use any products that contain nicotine or tobacco, such as cigarettes, e-cigarettes, and chewing tobacco. If you need help quitting, ask your health care provider. Monitor your blood pressure at home as told by your health care provider. Keep all follow-up visits as told by your health care provider. This is important. Medicines Take over-the-counter and prescription medicines only as told by your health care provider. Follow directions carefully. Blood pressure medicines must be taken as prescribed. Do not skip doses of blood pressure medicine. Doing this puts you at risk for problems and can make the medicine less effective. Ask your health care provider about side effects or reactions to medicines that you should watch for. Contact a health care provider if you: Think you are having a reaction to a medicine you are taking. Have headaches that keep coming back (recurring). Feel dizzy. Have swelling in your ankles. Have trouble with your vision. Get help right  away if you: Develop a severe headache or confusion. Have unusual weakness or numbness. Feel faint. Have severe pain in your chest or abdomen. Vomit repeatedly. Have trouble breathing. Summary Hypertension is when the force of blood pumping through your arteries is too strong. If this condition is not controlled, it may put you at risk for serious complications. Your personal target blood pressure may vary depending on your medical conditions, your age, and other factors. For most people, a normal blood pressure is less than 120/80. Hypertension is treated with lifestyle changes, medicines, or a combination of both. Lifestyle changes include losing  weight, eating a healthy, low-sodium diet, exercising more, and limiting alcohol. This information is not intended to replace advice given to you by your health care provider. Make sure you discuss any questions you have with your health care provider. Document Revised: 02/16/2018 Document Reviewed: 02/16/2018 Elsevier Patient Education  2022 [employer]   Hydrogen Peroxide for itchy ears apply a couple of drops

## 2021-04-20 DIAGNOSIS — M542 Cervicalgia: Secondary | ICD-10-CM | POA: Insufficient documentation

## 2021-04-20 NOTE — Assessment & Plan Note (Signed)
Quit smoking 4.5 months ago

## 2021-04-20 NOTE — Assessment & Plan Note (Signed)
She has not taken her last couple of doses of Metoprolol she agrees to restart 25 mg bid and if her numbers do not improve we can increase, she is asymptomatic

## 2021-04-20 NOTE — Assessment & Plan Note (Addendum)
She is feeling stressed and will consider counseling and daily meds in the future if symptoms do not improve. She can continue Lorazepam sparingly for now. Discussed stressors and options at length more than 30 minutes today

## 2021-04-20 NOTE — Assessment & Plan Note (Signed)
Has notable spasm over SCM muscle B/L. Anterior bed on right tender to palp. Encouraged moist heat and gentle stretching as tolerated. May try NSAIDs and prescription meds as directed and report if symptoms worsen or seek immediate care. Report if symptoms worsen or do not improvel

## 2021-06-09 ENCOUNTER — Encounter: Payer: Self-pay | Admitting: Family Medicine

## 2021-07-08 ENCOUNTER — Ambulatory Visit (INDEPENDENT_AMBULATORY_CARE_PROVIDER_SITE_OTHER): Payer: Self-pay | Admitting: Family Medicine

## 2021-07-08 ENCOUNTER — Encounter: Payer: Self-pay | Admitting: Family Medicine

## 2021-07-08 VITALS — BP 122/80 | HR 85 | Temp 98.0°F | Resp 16 | Ht 66.0 in | Wt 190.0 lb

## 2021-07-08 DIAGNOSIS — K1370 Unspecified lesions of oral mucosa: Secondary | ICD-10-CM | POA: Insufficient documentation

## 2021-07-08 DIAGNOSIS — Z720 Tobacco use: Secondary | ICD-10-CM

## 2021-07-08 DIAGNOSIS — F418 Other specified anxiety disorders: Secondary | ICD-10-CM

## 2021-07-08 DIAGNOSIS — G4700 Insomnia, unspecified: Secondary | ICD-10-CM

## 2021-07-08 DIAGNOSIS — I10 Essential (primary) hypertension: Secondary | ICD-10-CM

## 2021-07-08 DIAGNOSIS — Z0000 Encounter for general adult medical examination without abnormal findings: Secondary | ICD-10-CM

## 2021-07-08 DIAGNOSIS — K219 Gastro-esophageal reflux disease without esophagitis: Secondary | ICD-10-CM

## 2021-07-08 LAB — LIPID PANEL
Cholesterol: 142 mg/dL (ref 0–200)
HDL: 61.4 mg/dL (ref >39.00)
LDL Cholesterol: 63 mg/dL (ref 0–99)
NonHDL: 80.19
Total CHOL/HDL Ratio: 2
Triglycerides: 85 mg/dL (ref 0.0–149.0)
VLDL: 17 mg/dL (ref 0.0–40.0)

## 2021-07-08 LAB — CBC WITH DIFFERENTIAL/PLATELET
Basophils Absolute: 0 10*3/uL (ref 0.0–0.1)
Basophils Relative: 0.6 % (ref 0.0–3.0)
Eosinophils Absolute: 0.1 10*3/uL (ref 0.0–0.7)
Eosinophils Relative: 1.6 % (ref 0.0–5.0)
HCT: 44.3 % (ref 36.0–46.0)
Hemoglobin: 14.2 g/dL (ref 12.0–15.0)
Lymphocytes Relative: 34.6 % (ref 12.0–46.0)
Lymphs Abs: 2.6 10*3/uL (ref 0.7–4.0)
MCHC: 32.1 g/dL (ref 30.0–36.0)
MCV: 86 fl (ref 78.0–100.0)
Monocytes Absolute: 0.4 10*3/uL (ref 0.1–1.0)
Monocytes Relative: 5.2 % (ref 3.0–12.0)
Neutro Abs: 4.3 10*3/uL (ref 1.4–7.7)
Neutrophils Relative %: 58 % (ref 43.0–77.0)
Platelets: 397 10*3/uL (ref 150.0–400.0)
RBC: 5.16 Mil/uL — ABNORMAL HIGH (ref 3.87–5.11)
RDW: 14.3 % (ref 11.5–15.5)
WBC: 7.5 10*3/uL (ref 4.0–10.5)

## 2021-07-08 LAB — COMPREHENSIVE METABOLIC PANEL
ALT: 30 U/L (ref 0–35)
AST: 23 U/L (ref 0–37)
Albumin: 4.1 g/dL (ref 3.5–5.2)
Alkaline Phosphatase: 136 U/L — ABNORMAL HIGH (ref 39–117)
BUN: 9 mg/dL (ref 6–23)
CO2: 27 mEq/L (ref 19–32)
Calcium: 9.3 mg/dL (ref 8.4–10.5)
Chloride: 104 mEq/L (ref 96–112)
Creatinine, Ser: 0.69 mg/dL (ref 0.40–1.20)
GFR: 117.07 mL/min (ref >60.00)
Glucose, Bld: 93 mg/dL (ref 70–99)
Potassium: 4.4 mEq/L (ref 3.5–5.1)
Sodium: 140 mEq/L (ref 135–145)
Total Bilirubin: 0.4 mg/dL (ref 0.2–1.2)
Total Protein: 7.1 g/dL (ref 6.0–8.3)

## 2021-07-08 LAB — TSH: TSH: 4.74 u[IU]/mL (ref 0.35–5.50)

## 2021-07-08 MED ORDER — LORAZEPAM 0.5 MG PO TABS: 0.5000 mg | ORAL_TABLET | Freq: Two times a day (BID) | ORAL | 1 refills | Status: DC | PRN

## 2021-07-08 NOTE — Assessment & Plan Note (Signed)
Encouraged good sleep hygiene such as dark, quiet room. No blue/green glowing lights such as computer screens in bedroom. No alcohol or stimulants in evening. Cut down on caffeine as able. Regular exercise is helpful but not just prior to bed time.  

## 2021-07-08 NOTE — Progress Notes (Signed)
Subjective:    Patient ID: Beverly Pearson, female    DOB: [birth year], 31 y.o.   MRN: [id number]  Chief Complaint  Patient presents with   Annual Exam    HPI Patient is in today for annual physical exam and follow up on chronic medical concerns. She is mostly doing well but she does acknowledge ongoing anxiety and depression. She has an appointment with psychiatry next week to help manage her symptoms. She continues to struggle with anhedonia but does not endorse suicidal ideation. She is working at [employer] and she reports her job is going well. Is trying to eat well and exercise. Denies CP/palp/SOB/HA/congestion/fevers/GI or GU c/o. Taking meds as prescribed   Past Medical History:  Diagnosis Date   Anxiety 02/17/2011   Constipation    Depression with anxiety 03/03/2011   GERD (gastroesophageal reflux disease)    Herpes simplex without mention of complication    Insomnia 09/01/2011   Overweight 04/21/2015   Reflux 02/17/2011   Reflux 03/03/2011   Reflux 09/16/2011   Tobacco use 12/25/2016    Past Surgical History:  Procedure Laterality Date   TONSILLECTOMY AND ADENOIDECTOMY     age 85    Family History  Problem Relation Age of Onset   Diabetes Mother        type 2   Hypertension Mother    Diabetes Maternal Grandmother        type 2   Cancer Maternal Grandmother 39       ovarian   Hypertension Maternal Grandmother    Kidney failure Maternal Grandfather    Hypertension Maternal Grandfather    Kidney disease Maternal Grandfather    Diabetes Paternal Grandmother        type 2   Hypertension Paternal Grandmother    Emphysema Paternal Grandfather        smoke   COPD Paternal Grandfather    Cancer Paternal Grandfather 78       prostate cancer   Colon cancer Neg Hx     Social History   Socioeconomic History   Marital status: Single    Spouse name: Not on file   Number of children: 0   Years of education: Not on file   Highest education level: Not on file  Occupational  History   Occupation: Unemployed  Tobacco Use   Smoking status: Every Day    Types: Cigarettes   Smokeless tobacco: Never   Tobacco comments:    smokes 1 or 2 black and milds daily-  Vaping Use   Vaping Use: Never used  Substance and Sexual Activity   Alcohol use: Yes    Comment: occ   Drug use: Yes    Types: Marijuana   Sexual activity: Never    Birth control/protection: Pill  Other Topics Concern   Not on file  Social History Narrative   Occasional soda    Social Determinants of Health   Financial Resource Strain: Not on file  Food Insecurity: Not on file  Transportation Needs: Not on file  Physical Activity: Not on file  Stress: Not on file  Social Connections: Not on file  Intimate Partner Violence: Not on file    Outpatient Medications Prior to Visit  Medication Sig Dispense Refill   Levonorgestrel-Ethinyl Estradiol (SEASONIQUE) 0.15-0.03 &0.01 MG tablet Take 1 tablet by mouth daily. 91 tablet 3   metoprolol tartrate (LOPRESSOR) 25 MG tablet Take 1 tablet (25 mg total) by mouth 2 (two) times daily. 60 tablet 5  Multiple Vitamin (MULTIVITAMIN) tablet Take 1 tablet by mouth daily as needed.     triamcinolone cream (KENALOG) 0.1 % Apply 1 application topically 2 (two) times daily. 80 g 1   LORazepam (ATIVAN) 0.5 MG tablet Take 1 tablet by mouth twice daily as needed for anxiety 30 tablet 0   No facility-administered medications prior to visit.    Allergies  Allergen Reactions   Other Other (See Comments)    HOUSE DUST, sneezing and runny nose    Review of Systems  Constitutional:  Negative for fever and malaise/fatigue.  HENT:  Negative for congestion.   Eyes:  Negative for blurred vision.  Respiratory:  Negative for shortness of breath.   Cardiovascular:  Negative for chest pain, palpitations and leg swelling.  Gastrointestinal:  Negative for abdominal pain, blood in stool and nausea.  Genitourinary:  Negative for dysuria and frequency.  Musculoskeletal:   Negative for falls.  Skin:  Negative for rash.  Neurological:  Negative for dizziness, loss of consciousness and headaches.  Endo/Heme/Allergies:  Negative for environmental allergies.  Psychiatric/Behavioral:  Positive for depression. The patient is nervous/anxious.       Objective:    Physical Exam Constitutional:      General: She is not in acute distress.    Appearance: She is well-developed.  HENT:     Head: Normocephalic and atraumatic.  Eyes:     Conjunctiva/sclera: Conjunctivae normal.  Neck:     Thyroid: No thyromegaly.  Cardiovascular:     Rate and Rhythm: Normal rate and regular rhythm.     Heart sounds: Normal heart sounds. No murmur heard. Pulmonary:     Effort: Pulmonary effort is normal. No respiratory distress.     Breath sounds: Normal breath sounds.  Abdominal:     General: Bowel sounds are normal. There is no distension.     Palpations: Abdomen is soft. There is no mass.     Tenderness: There is no abdominal tenderness.  Musculoskeletal:     Cervical back: Neck supple.  Lymphadenopathy:     Cervical: No cervical adenopathy.  Skin:    General: Skin is warm and dry.  Neurological:     Mental Status: She is alert and oriented to person, place, and time.  Psychiatric:        Behavior: Behavior normal.    BP 122/80    Pulse 85    Temp 98 F (36.7 C)    Resp 16    Ht 5\' 6"  (1.676 m)    Wt 190 lb (86.2 kg)    SpO2 97%    BMI 30.67 kg/m  Wt Readings from Last 3 Encounters:  07/08/21 190 lb (86.2 kg)  04/17/21 188 lb 9.6 oz (85.5 kg)  11/25/20 185 lb 12.8 oz (84.3 kg)    Diabetic Foot Exam - Simple   No data filed    Lab Results  Component Value Date   WBC 7.5 07/08/2021   HGB 14.2 07/08/2021   HCT 44.3 07/08/2021   PLT 397.0 07/08/2021   GLUCOSE 93 07/08/2021   CHOL 142 07/08/2021   TRIG 85.0 07/08/2021   HDL 61.40 07/08/2021   LDLCALC 63 07/08/2021   ALT 30 07/08/2021   AST 23 07/08/2021   NA 140 07/08/2021   K 4.4 07/08/2021   CL 104  07/08/2021   CREATININE 0.69 07/08/2021   BUN 9 07/08/2021   CO2 27 07/08/2021   TSH 4.74 07/08/2021   HGBA1C 5.9 10/15/2020    Lab Results  Component Value Date   TSH 4.74 07/08/2021   Lab Results  Component Value Date   WBC 7.5 07/08/2021   HGB 14.2 07/08/2021   HCT 44.3 07/08/2021   MCV 86.0 07/08/2021   PLT 397.0 07/08/2021   Lab Results  Component Value Date   NA 140 07/08/2021   K 4.4 07/08/2021   CO2 27 07/08/2021   GLUCOSE 93 07/08/2021   BUN 9 07/08/2021   CREATININE 0.69 07/08/2021   BILITOT 0.4 07/08/2021   ALKPHOS 136 (H) 07/08/2021   AST 23 07/08/2021   ALT 30 07/08/2021   PROT 7.1 07/08/2021   ALBUMIN 4.1 07/08/2021   CALCIUM 9.3 07/08/2021   GFR 117.07 07/08/2021   Lab Results  Component Value Date   CHOL 142 07/08/2021   Lab Results  Component Value Date   HDL 61.40 07/08/2021   Lab Results  Component Value Date   LDLCALC 63 07/08/2021   Lab Results  Component Value Date   TRIG 85.0 07/08/2021   Lab Results  Component Value Date   CHOLHDL 2 07/08/2021   Lab Results  Component Value Date   HGBA1C 5.9 10/15/2020       Assessment & Plan:   Problem List Items Addressed This Visit     Depression with anxiety    She has an appt next with a psychiatrist at [facility]. She is using Lorazepam infrequently to manage very stressful moments but is otherwise doing well      Relevant Medications   LORazepam (ATIVAN) 0.5 MG tablet   Insomnia    Encouraged good sleep hygiene such as dark, quiet room. No blue/green glowing lights such as computer screens in bedroom. No alcohol or stimulants in evening. Cut down on caffeine as able. Regular exercise is helpful but not just prior to bed time.       GERD (gastroesophageal reflux disease)    Summer 2022 had some flares in stomach trouble. She notes she awakens with excruciating pain in stomach, chest and back pain lasted roughly 6 hours and resolved. Was nauseous but did not vomit.  Does note some improvement after bowel movements. Next episode in early fall that contained several episodes. Each resolving after some hours with same symptoms. Final episode was last week after a big, fatty meal with steak, lobster, scalloped potatoes with same symptoms. Mother with gallbladder disease and her gallbladder was removed when patient. Attempt to watch diet and if escalates will need [place] and surgical consultation      Tobacco use    Encouraged complete cessation.      Preventative health care    Patient encouraged to maintain heart healthy diet, regular exercise, adequate sleep. Consider daily probiotics. Take medications as prescribed. Labs ordered and reviewed      Relevant Orders   CBC with Differential/Platelet (Completed)   Comprehensive metabolic panel (Completed)   TSH (Completed)   Lipid panel (Completed)   Hypertension - Primary    Well controlled, no changes to meds. Encouraged heart healthy diet such as the DASH diet and exercise as tolerated.       Relevant Orders   CBC with Differential/Platelet (Completed)   Comprehensive metabolic panel (Completed)   TSH (Completed)   Lipid panel (Completed)   Oral mucosal lesion    Has been by dentist. Started under her tongue in 2020 tried a steroid mouthwash and treated with dexamethasone and nyustatin and responded. It changes over time. Initially they thought it was fungal. Second episode was April  2022 treated   oral liquid antifungal probably Nystatin, but no response. Then it was treated with Magic Mouthwash in June and responded. 3rd episode started in early December has now been on Magic Mouthwash about 2.5 weeks and maybe slightly better. Is considering referral to an oral surgeon. Dentist Dr Mable Paris, [place] Blakes in [facility], saw an oral surgeon off near [address]       I have changed Beverly S. Hun's LORazepam. I am also having her maintain her multivitamin, triamcinolone cream,  Levonorgestrel-Ethinyl Estradiol, and metoprolol tartrate.  Meds ordered this encounter  Medications   LORazepam (ATIVAN) 0.5 MG tablet    Sig: Take 1 tablet (0.5 mg total) by mouth 2 (two) times daily as needed. for anxiety    Dispense:  30 tablet    Refill:  1     Penni Homans, MD

## 2021-07-08 NOTE — Assessment & Plan Note (Signed)
Patient encouraged to maintain heart healthy diet, regular exercise, adequate sleep. Consider daily probiotics. Take medications as prescribed. Labs ordered and reviewed 

## 2021-07-08 NOTE — Assessment & Plan Note (Signed)
Has been by dentist. Started under her tongue in 2020 tried a steroid mouthwash and treated with dexamethasone and nyustatin and responded. It changes over time. Initially they thought it was fungal. Second episode was April 2022 treated   oral liquid antifungal probably Nystatin, but no response. Then it was treated with Magic Mouthwash in June and responded. 3rd episode started in early December has now been on Magic Mouthwash about 2.5 weeks and maybe slightly better. Is considering referral to an oral surgeon. Dentist Dr Mable Paris, [place] Blakes in [facility], saw an oral surgeon off near [address]

## 2021-07-08 NOTE — Patient Instructions (Signed)
Oromaxillofacial surgeon  Preventive Care 67-30 Years Old, Female Preventive care refers to lifestyle choices and visits with your health care provider that can promote health and wellness. Preventive care visits are also called wellness exams. What can I expect for my preventive care visit? Counseling During your preventive care visit, your health care provider may ask about your: Medical history, including: Past medical problems. Family medical history. Pregnancy history. Current health, including: Menstrual cycle. Method of birth control. Emotional well-being. Home life and relationship well-being. Sexual activity and sexual health. Lifestyle, including: Alcohol, nicotine or tobacco, and drug use. Access to firearms. Diet, exercise, and sleep habits. Work and work Statistician. Sunscreen use. Safety issues such as seatbelt and bike helmet use. Physical exam Your health care provider may check your: Height and weight. These may be used to calculate your BMI (body mass index). BMI is a measurement that tells if you are at a healthy weight. Waist circumference. This measures the distance around your waistline. This measurement also tells if you are at a healthy weight and may help predict your risk of certain diseases, such as type 2 diabetes and high blood pressure. Heart rate and blood pressure. Body temperature. Skin for abnormal spots. What immunizations do I need? Vaccines are usually given at various ages, according to a schedule. Your health care provider will recommend vaccines for you based on your age, medical history, and lifestyle or other factors, such as travel or where you work. What tests do I need? Screening Your health care provider may recommend screening tests for certain conditions. This may include: Pelvic exam and Pap test. Lipid and cholesterol levels. Diabetes screening. This is done by checking your blood sugar (glucose) after you have not eaten for a  while (fasting). Hepatitis B test. Hepatitis C test. HIV (human immunodeficiency virus) test. STI (sexually transmitted infection) testing, if you are at risk. BRCA-related cancer screening. This may be done if you have a family history of breast, ovarian, tubal, or peritoneal cancers. Talk with your health care provider about your test results, treatment options, and if necessary, the need for more tests. Follow these instructions at home: Eating and drinking  Eat a healthy diet that includes fresh fruits and vegetables, whole grains, lean protein, and low-fat dairy products. Take vitamin and mineral supplements as recommended by your health care provider. Do not drink alcohol if: Your health care provider tells you not to drink. You are pregnant, may be pregnant, or are planning to become pregnant. If you drink alcohol: Limit how much you have to 0-1 drink a day. Know how much alcohol is in your drink. In the [place] one drink equals one 12 oz bottle of beer (355 mL), one 5 oz glass of wine (148 mL), or one 1 oz glass of hard liquor (44 mL). Lifestyle Brush your teeth every morning and night with fluoride toothpaste. Floss one time each day. Exercise for at least 30 minutes 5 or more days each week. Do not use any products that contain nicotine or tobacco. These products include cigarettes, chewing tobacco, and vaping devices, such as e-cigarettes. If you need help quitting, ask your health care provider. Do not use drugs. If you are sexually active, practice safe sex. Use a condom or other form of protection to prevent STIs. If you do not wish to become pregnant, use a form of birth control. If you plan to become pregnant, see your health care provider for a prepregnancy visit. Find healthy ways to manage stress, such  as: Meditation, yoga, or listening to music. Journaling. Talking to a trusted person. Spending time with friends and family. Minimize exposure to UV radiation to reduce  your risk of skin cancer. Safety Always wear your seat belt while driving or riding in a vehicle. Do not drive: If you have been drinking alcohol. Do not ride with someone who has been drinking. If you have been using any mind-altering substances or drugs. While texting. When you are tired or distracted. Wear a helmet and other protective equipment during sports activities. If you have firearms in your house, make sure you follow all gun safety procedures. Seek help if you have been physically or sexually abused. What's next? Go to your health care provider once a year for an annual wellness visit. Ask your health care provider how often you should have your eyes and teeth checked. Stay up to date on all vaccines. This information is not intended to replace advice given to you by your health care provider. Make sure you discuss any questions you have with your health care provider. Document Revised: 12/04/2020 Document Reviewed: 12/04/2020 Elsevier Patient Education  [place].

## 2021-07-08 NOTE — Assessment & Plan Note (Signed)
Encouraged complete cessation. 

## 2021-07-08 NOTE — Assessment & Plan Note (Signed)
Summer 2022 had some flares in stomach trouble. She notes she awakens with excruciating pain in stomach, chest and back pain lasted roughly 6 hours and resolved. Was nauseous but did not vomit. Does note some improvement after bowel movements. Next episode in early fall that contained several episodes. Each resolving after some hours with same symptoms. Final episode was last week after a big, fatty meal with steak, lobster, scalloped potatoes with same symptoms. Mother with gallbladder disease and her gallbladder was removed when patient. Attempt to watch diet and if escalates will need [place] and surgical consultation

## 2021-07-08 NOTE — Assessment & Plan Note (Signed)
Well controlled, no changes to meds. Encouraged heart healthy diet such as the DASH diet and exercise as tolerated.  °

## 2021-07-09 NOTE — Assessment & Plan Note (Signed)
She has an appt next with a psychiatrist at [facility]. She is using Lorazepam infrequently to manage very stressful moments but is otherwise doing well

## 2021-07-09 NOTE — Addendum Note (Signed)
Addended by: Danise Edge A on: 07/09/2021 08:29 PM   Modules accepted: Level of Service

## 2021-07-18 ENCOUNTER — Other Ambulatory Visit: Payer: Self-pay | Admitting: Family Medicine

## 2021-07-18 DIAGNOSIS — R748 Abnormal levels of other serum enzymes: Secondary | ICD-10-CM

## 2021-08-11 ENCOUNTER — Telehealth: Payer: Self-pay

## 2021-08-11 NOTE — Telephone Encounter (Signed)
Nurse Assessment Nurse: Dwyane Luo, RN, [place] Date/Time (Eastern Time): 08/10/2021 11:27:39 PM Confirm and document reason for call. If symptomatic, describe symptoms. ---caller states that she has been having pain to the right of her belly button the last 4 days. rating 2/10 consistent pain Does the patient have any new or worsening symptoms? ---Yes Will a triage be completed? ---Yes Related visit to physician within the last 2 weeks? ---No Does the PT have any chronic conditions? (i.e. diabetes, asthma, this includes High risk factors for pregnancy, etc.) ---No Is the patient pregnant or possibly pregnant? (Ask all females between the ages of 46-55) ---No Is this a behavioral health or substance abuse call? ---No Guidelines Guideline Title Affirmed Question Affirmed Notes Nurse Date/Time (Eastern Time) Abdominal Pain - Upper [1] MILDMODERATE pain AND [2] constant AND [3] present > 2 hours Bogle, RN, [facility] 08/10/2021 11:29:31 PM Disp. Time Lamount Cohen Time) Disposition Final User PLEASE NOTE: All timestamps contained within this report are represented as [place] Standard Time. CONFIDENTIALTY NOTICE: This fax transmission is intended only for the addressee. It contains information that is legally privileged, confidential or otherwise protected from use or disclosure. If you are not the intended recipient, you are strictly prohibited from reviewing, disclosing, copying using or disseminating any of this information or taking any action in reliance on or regarding this information. If you have received this fax in error, please notify us immediately by telephone so that we can arrange for its return to [place]. Phone: [phone], Toll-Free: [phone], Fax: [phone] Page: 2 of 2 Call Id: [id number] 08/10/2021 11:34:13 PM See HCP within 4 Hours (or PCP triage) Yes Dwyane Luo, RN, Lyla Glassing Disagree/Comply Comply Caller Understands Yes PreDisposition Go to ED Care Advice Given Per  Guideline SEE HCP (OR PCP TRIAGE) WITHIN 4 HOURS: TAKE AN ANTACID MEDICINE: * If having pain now, try taking an antacid (e.g., Mylanta, Maalox). * They may help if you have ACID REFLUX (reflux esophagitis) or GASTRITIS (stomach inflammation). * Dose: Take 2 tablespoons (30 ml) of liquid by mouth. * Before taking any medicine, read all the instructions on the package. CALL BACK IF: * You become worse CARE ADVICE given per Abdominal Pain, Upper (Adult) guideline. Comments User: Inez Catalina, RN Date/Time Lamount Cohen Time): 08/10/2021 11:34:07 PM informed caller of both closing of primary and urgent care. instructed her to go to the ER to be seen within recommended disposition time Referrals [facility] - ED

## 2021-08-11 NOTE — Telephone Encounter (Signed)
Left message on machine for patient to call back to see if patient ever went anywhere or we can get her scheduled her today.

## 2021-08-31 NOTE — Progress Notes (Signed)
? ? ? ?09/01/2021 ?Beverly Pearson ?[id number] ?07-Sep-1990 ? ? ?ASSESSMENT AND PLAN:  ?Gastroesophageal reflux disease without esophagitis ?We will check for H. pylori, after giving stool sample can do 2-week trial of Prilosec to see if this helps. ?Can consider endoscopic evaluation if symptoms continue. ?-     CBC with Differential/Platelet; Future ?-     Comprehensive metabolic panel; Future ?-     H. pylori antigen, stool; Future ?-     US ABDOMEN LIMITED RUQ (LIVER/GB); Future ? ?RUQ abdominal pain ?Family history of gallbladder issues, appears to be worse with fatty foods, did have slight elevation of alkaline phosphatase in January, will repeat labs and get right upper quadrant abdominal ultrasound. ?ER precautions discussed with patient ?If negative consider HIDA versus endoscopy. ?-     Lipase; Future ?-     US ABDOMEN LIMITED RUQ (LIVER/GB); Future ?-     hyoscyamine (LEVSIN) 0.125 MG tablet; Take 1 tablet (0.125 mg total) by mouth every 6 (six) hours as needed for cramping (AB pain). ? ? ?Patient Care Team: ?Bradd Canary, MD as PCP - General (Family Medicine) ?Maxie Better, MD as Consulting Physician (Obstetrics and Gynecology) ? ?HISTORY OF PRESENT ILLNESS: ?31 y.o. female referred by Bradd Canary, MD, with a past medical history of hypertension, constipation, GERD and others listed below presents for evaluation of AB pain.  ?Previously seen by Dr. [name] in 2012 for abdominal pain. ?Negative sed rate, negative celiac panel at that time ?01/2011 abdominal ultrasound without evidence of gallstones, normal bile ducts, normal liver, normal pancreas ? ?Has seen her PCP recently for reflux, progressively worse, worse after fatty foods.  Mother with gallbladder disease. ?07/08/2021 alkaline phosphatase 136 otherwise labs are unremarkable ? ?She states GERD has resolved but this past year she has been having epigastric AB pain, RUQ pain coming and going,.  ?Episode in Jan was after eating steak  with bone marrow butter and lobster tail in [place], woke up with symptoms.  ?In last 8 months has had 4-5 "attacks", last one was beginning of Feb.  ?Has been having diarrhea for several days after last attack, has been more solid/formed stools lately, once a day or every other day now.  ? ?States she has RUQ and epigastric bloating/AB pain, goes through to her shoulder blades, has burning, burping and will last for several hours. Some nausea with episodes but never any vomiting.  ?Will have bloating after eating several days after.  ?Denies melena, blood in stools.  ? ?Denies NSAIDS.  ?She quit smoking cigs June of last year.  ?Socially drinking, no issues with AB pain or symptoms.  ? ?Current Medications:  ? ?Current Outpatient Medications (Endocrine & Metabolic):  ?  Levonorgestrel-Ethinyl Estradiol (SEASONIQUE) 0.15-0.03 &0.01 MG tablet, Take 1 tablet by mouth daily. ? ?Current Outpatient Medications (Cardiovascular):  ?  metoprolol tartrate (LOPRESSOR) 25 MG tablet, Take 1 tablet (25 mg total) by mouth 2 (two) times daily. ? ? ? ? ?Current Outpatient Medications (Other):  ?  hyoscyamine (LEVSIN) 0.125 MG tablet, Take 1 tablet (0.125 mg total) by mouth every 6 (six) hours as needed for cramping (AB pain). ?  LORazepam (ATIVAN) 0.5 MG tablet, Take 1 tablet (0.5 mg total) by mouth 2 (two) times daily as needed. for anxiety ?  Multiple Vitamin (MULTIVITAMIN) tablet, Take 1 tablet by mouth daily as needed. ? ?Medical History:  ?Past Medical History:  ?Diagnosis Date  ? Anxiety 02/17/2011  ? Constipation   ? 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 GERD (gastroesophageal reflux disease)   ? Herpes simplex without mention of complication   ? Insomnia 09/01/2011  ? Overweight 04/21/2015  ? Reflux 02/17/2011  ? Reflux 03/03/2011  ? Reflux 09/16/2011  ? Tobacco use 12/25/2016  ? ?Allergies:  ?Allergies  ?Allergen Reactions  ? Other Other (See Comments)  ?  HOUSE DUST, sneezing and runny nose  ?  ? ?Surgical History:  ?She  has a  past surgical history that includes Tonsillectomy and adenoidectomy and Wisdom tooth extraction. ?Family History:  ?Her family history includes COPD in her paternal grandfather; Cancer (age of onset: 64) in her maternal grandmother and paternal grandfather; Diabetes in her maternal grandmother, mother, and paternal grandmother; Emphysema in her paternal grandfather; Hypertension in her maternal grandfather, maternal grandmother, mother, and paternal grandmother; Kidney disease in her maternal grandfather; Kidney failure in her maternal grandfather. ?Social History:  ? reports that she quit smoking about 8 months ago. Her smoking use included cigarettes. She has never used smokeless tobacco. She reports current alcohol use. She reports that she does not currently use drugs after having used the following drugs: Marijuana. ? ?REVIEW OF SYSTEMS  : All other systems reviewed and negative except where noted in the History of Present Illness. ? ? ?PHYSICAL EXAM: ?BP 120/70   Pulse 63   Ht 5\' 6"  (1.676 m)   Wt 191 lb (86.6 kg)   BMI 30.83 kg/m?  ?General:   Pleasant, well developed female in no acute distress ?Head:  Normocephalic and atraumatic. ?Eyes: sclerae anicteric,conjunctive pink  ?Heart:  regular rate and rhythm ?Pulm: Clear anteriorly; no wheezing ?Abdomen:  Soft, Obese AB, skin exam normal, Normal bowel sounds. mild tenderness in the RUQ. Without guarding and Without rebound, without hepatomegaly. ?Extremities:  Without edema. ?Msk:  Symmetrical without gross deformities. Peripheral pulses intact.  ?Neurologic:  Alert and  oriented x4;  grossly normal neurologically. ?Skin:   Dry and intact without significant lesions or rashes. ?Psychiatric: Demonstrates good judgement and reason without abnormal affect or behaviors. ? ? ? , PA-C ?12:07 PM ? ? ?

## 2021-09-01 ENCOUNTER — Other Ambulatory Visit (INDEPENDENT_AMBULATORY_CARE_PROVIDER_SITE_OTHER): Payer: 59

## 2021-09-01 ENCOUNTER — Encounter: Payer: Self-pay | Admitting: Physician Assistant

## 2021-09-01 ENCOUNTER — Ambulatory Visit: Payer: 59 | Admitting: Physician Assistant

## 2021-09-01 VITALS — BP 120/70 | HR 63 | Ht 66.0 in | Wt 191.0 lb

## 2021-09-01 DIAGNOSIS — R1011 Right upper quadrant pain: Secondary | ICD-10-CM

## 2021-09-01 DIAGNOSIS — K219 Gastro-esophageal reflux disease without esophagitis: Secondary | ICD-10-CM

## 2021-09-01 LAB — CBC WITH DIFFERENTIAL/PLATELET
Basophils Absolute: 0.1 10*3/uL (ref 0.0–0.1)
Basophils Relative: 0.7 % (ref 0.0–3.0)
Eosinophils Absolute: 0.1 10*3/uL (ref 0.0–0.7)
Eosinophils Relative: 0.9 % (ref 0.0–5.0)
HCT: 43.2 % (ref 36.0–46.0)
Hemoglobin: 14.2 g/dL (ref 12.0–15.0)
Lymphocytes Relative: 28.5 % (ref 12.0–46.0)
Lymphs Abs: 2.1 10*3/uL (ref 0.7–4.0)
MCHC: 33 g/dL (ref 30.0–36.0)
MCV: 85.4 fl (ref 78.0–100.0)
Monocytes Absolute: 0.3 10*3/uL (ref 0.1–1.0)
Monocytes Relative: 4.1 % (ref 3.0–12.0)
Neutro Abs: 4.8 10*3/uL (ref 1.4–7.7)
Neutrophils Relative %: 65.8 % (ref 43.0–77.0)
Platelets: 351 10*3/uL (ref 150.0–400.0)
RBC: 5.06 Mil/uL (ref 3.87–5.11)
RDW: 14.4 % (ref 11.5–15.5)
WBC: 7.4 10*3/uL (ref 4.0–10.5)

## 2021-09-01 LAB — LIPASE: Lipase: 17 U/L (ref 11.0–59.0)

## 2021-09-01 LAB — COMPREHENSIVE METABOLIC PANEL
ALT: 56 U/L — ABNORMAL HIGH (ref 0–35)
AST: 28 U/L (ref 0–37)
Albumin: 4 g/dL (ref 3.5–5.2)
Alkaline Phosphatase: 126 U/L — ABNORMAL HIGH (ref 39–117)
BUN: 10 mg/dL (ref 6–23)
CO2: 25 mEq/L (ref 19–32)
Calcium: 9.2 mg/dL (ref 8.4–10.5)
Chloride: 105 mEq/L (ref 96–112)
Creatinine, Ser: 0.8 mg/dL (ref 0.40–1.20)
GFR: 99.29 mL/min (ref >60.00)
Glucose, Bld: 103 mg/dL — ABNORMAL HIGH (ref 70–99)
Potassium: 3.9 mEq/L (ref 3.5–5.1)
Sodium: 139 mEq/L (ref 135–145)
Total Bilirubin: 0.4 mg/dL (ref 0.2–1.2)
Total Protein: 6.9 g/dL (ref 6.0–8.3)

## 2021-09-01 MED ORDER — HYOSCYAMINE SULFATE 0.125 MG PO TABS: 0.1250 mg | ORAL_TABLET | Freq: Four times a day (QID) | ORAL | 0 refills | Status: DC | PRN

## 2021-09-01 NOTE — Patient Instructions (Signed)
Give stool sample and then can do trial of prilosec over the counter- 2 week course Avoid spicy and acidic foods Avoid fatty foods Limit your intake of coffee, tea, alcohol, and carbonated drinks Work to maintain a healthy weight Keep the head of the bed elevated at least 3 inches with blocks or a wedge pillow if you are having any nighttime symptoms Stay upright for 2 hours after eating Avoid meals and snacks three to four hours before bedtime  Go to the ER if you have any yellowing of the skin/eyes, dark urine, severe AB pain, fever, chills, nausea or vomiting.     Gastroesophageal Reflux Disease, Adult Gastroesophageal reflux (GER) happens when acid from the stomach flows up into the tube that connects the mouth and the stomach (esophagus). Normally, food travels down the esophagus and stays in the stomach to be digested. However, when a person has GER, food and stomach acid sometimes move back up into the esophagus. If this becomes a more serious problem, the person may be diagnosed with a disease called gastroesophageal reflux disease (GERD). GERD occurs when the reflux: Happens often. Causes frequent or severe symptoms. Causes problems such as damage to the esophagus. When stomach acid comes in contact with the esophagus, the acid may cause inflammation in the esophagus. Over time, GERD may create small holes (ulcers) in the lining of the esophagus. What are the causes? This condition is caused by a problem with the muscle between the esophagus and the stomach (lower esophageal sphincter, or LES). Normally, the LES muscle closes after food passes through the esophagus to the stomach. When the LES is weakened or abnormal, it does not close properly, and that allows food and stomach acid to go back up into the esophagus. The LES can be weakened by certain dietary substances, medicines, and medical conditions, including: Tobacco use. Pregnancy. Having a hiatal hernia. Alcohol  use. Certain foods and beverages, such as coffee, chocolate, onions, and peppermint. What increases the risk? You are more likely to develop this condition if you: Have an increased body weight. Have a connective tissue disorder. Take NSAIDs, such as ibuprofen. What are the signs or symptoms? Symptoms of this condition include: Heartburn. Difficult or painful swallowing and the feeling of having a lump in the throat. A bitter taste in the mouth. Bad breath and having a large amount of saliva. Having an upset or bloated stomach and belching. Chest pain. Different conditions can cause chest pain. Make sure you see your health care provider if you experience chest pain. Shortness of breath or wheezing. Ongoing (chronic) cough or a nighttime cough. Wearing away of tooth enamel. Weight loss. How is this diagnosed? This condition may be diagnosed based on a medical history and a physical exam. To determine if you have mild or severe GERD, your health care provider may also monitor how you respond to treatment. You may also have tests, including: A test to examine your stomach and esophagus with a small camera (endoscopy). A test that measures the acidity level in your esophagus. A test that measures how much pressure is on your esophagus. A barium swallow or modified barium swallow test to show the shape, size, and functioning of your esophagus. How is this treated? Treatment for this condition may vary depending on how severe your symptoms are. Your health care provider may recommend: Changes to your diet. Medicine. Surgery. The goal of treatment is to help relieve your symptoms and to prevent complications. Follow these instructions at home: Eating  and drinking  Follow a diet as recommended by your health care provider. This may involve avoiding foods and drinks such as: Coffee and tea, with or without caffeine. Drinks that contain alcohol. Energy drinks and sports  drinks. Carbonated drinks or sodas. Chocolate and cocoa. Peppermint and mint flavorings. Garlic and onions. Horseradish. Spicy and acidic foods, including peppers, chili powder, curry powder, vinegar, hot sauces, and barbecue sauce. Citrus fruit juices and citrus fruits, such as oranges, lemons, and limes. Tomato-based foods, such as red sauce, chili, salsa, and pizza with red sauce. Fried and fatty foods, such as donuts, french fries, potato chips, and high-fat dressings. High-fat meats, such as hot dogs and fatty cuts of red and white meats, such as rib eye steak, sausage, ham, and bacon. High-fat dairy items, such as whole milk, butter, and cream cheese. Eat small, frequent meals instead of large meals. Avoid drinking large amounts of liquid with your meals. Avoid eating meals during the 2-3 hours before bedtime. Avoid lying down right after you eat. Do not exercise right after you eat. Lifestyle  Do not use any products that contain nicotine or tobacco. These products include cigarettes, chewing tobacco, and vaping devices, such as e-cigarettes. If you need help quitting, ask your health care provider. Try to reduce your stress by using methods such as yoga or meditation. If you need help reducing stress, ask your health care provider. If you are overweight, reduce your weight to an amount that is healthy for you. Ask your health care provider for guidance about a safe weight loss goal. General instructions Pay attention to any changes in your symptoms. Take over-the-counter and prescription medicines only as told by your health care provider. Do not take aspirin, ibuprofen, or other NSAIDs unless your health care provider told you to take these medicines. Wear loose-fitting clothing. Do not wear anything tight around your waist that causes pressure on your abdomen. Raise (elevate) the head of your bed about 6 inches (15 cm). You can use a wedge to do this. Avoid bending over if this  makes your symptoms worse. Keep all follow-up visits. This is important. Contact a health care provider if: You have: New symptoms. Unexplained weight loss. Difficulty swallowing or it hurts to swallow. Wheezing or a persistent cough. A hoarse voice. Your symptoms do not improve with treatment. Get help right away if: You have sudden pain in your arms, neck, jaw, teeth, or back. You suddenly feel sweaty, dizzy, or light-headed. You have chest pain or shortness of breath. You vomit and the vomit is green, yellow, or black, or it looks like blood or coffee grounds. You faint. You have stool that is red, bloody, or black. You cannot swallow, drink, or eat. These symptoms may represent a serious problem that is an emergency. Do not wait to see if the symptoms will go away. Get medical help right away. Call your local emergency services (911 in the [place] Do not drive yourself to the hospital. Summary Gastroesophageal reflux happens when acid from the stomach flows up into the esophagus. GERD is a disease in which the reflux happens often, causes frequent or severe symptoms, or causes problems such as damage to the esophagus. Treatment for this condition may vary depending on how severe your symptoms are. Your health care provider may recommend diet and lifestyle changes, medicine, or surgery. Contact a health care provider if you have new or worsening symptoms. Take over-the-counter and prescription medicines only as told by your health care provider. Do  not take aspirin, ibuprofen, or other NSAIDs unless your health care provider told you to do so. Keep all follow-up visits as told by your health care provider. This is important. This information is not intended to replace advice given to you by your health care provider. Make sure you discuss any questions you have with your health care provider. Document Revised: 12/18/2019 Document Reviewed: 12/18/2019 Elsevier Patient Education  2022  [employer]    You have been scheduled for an abdominal ultrasound at [facility] (1st floor of hospital) on 09/08/21 at 8:30am. Please arrive 30 minutes prior to your appointment for registration. Make certain not to have anything to eat or drink 8 hours prior to your appointment. Should you need to reschedule your appointment, please contact radiology at [phone]. This test typically takes about 30 minutes to perform.   Your provider has requested that you go to the basement level for lab work before leaving today. Press "B" on the elevator. The lab is located at the first door on the left as you exit the elevator.   Due to recent changes in healthcare laws, you may see the results of your imaging and laboratory studies on MyChart before your provider has had a chance to review them.  We understand that in some cases there may be results that are confusing or concerning to you. Not all laboratory results come back in the same time frame and the provider may be waiting for multiple results in order to interpret others.  Please give [place] 48 hours in order for your provider to thoroughly review all the results before contacting the office for clarification of your results.   Due to recent changes in healthcare laws, you may see the results of your imaging and laboratory studies on MyChart before your provider has had a chance to review them.  We understand that in some cases there may be results that are confusing or concerning to you. Not all laboratory results come back in the same time frame and the provider may be waiting for multiple results in order to interpret others.  Please give [place] 48 hours in order for your provider to thoroughly review all the results before contacting the office for clarification of your results.    I appreciate the  opportunity to care for you  Thank You   [facility]

## 2021-09-02 ENCOUNTER — Telehealth: Payer: Self-pay

## 2021-09-02 NOTE — Telephone Encounter (Signed)
Prior authorization started for Hyoscyamine.  ?

## 2021-09-04 LAB — H. PYLORI ANTIGEN, STOOL: H pylori Ag, Stl: NEGATIVE

## 2021-09-08 ENCOUNTER — Other Ambulatory Visit: Payer: Self-pay

## 2021-09-08 ENCOUNTER — Ambulatory Visit (HOSPITAL_COMMUNITY)
Admission: RE | Admit: 2021-09-08 | Discharge: 2021-09-08 | Disposition: A | Discharge status: Home / Self Care | Payer: 59 | Source: Ambulatory Visit | Attending: Physician Assistant | Admitting: Physician Assistant

## 2021-09-08 DIAGNOSIS — K219 Gastro-esophageal reflux disease without esophagitis: Secondary | ICD-10-CM | POA: Diagnosis present

## 2021-09-08 DIAGNOSIS — R1011 Right upper quadrant pain: Secondary | ICD-10-CM | POA: Insufficient documentation

## 2021-09-08 DIAGNOSIS — R160 Hepatomegaly, not elsewhere classified: Secondary | ICD-10-CM

## 2021-09-08 IMAGING — US US ABDOMEN LIMITED
1 series · 15 of 25 positions shown · non-contrast
Comparison: Abdominal ultrasound [DATE]

CLINICAL DATA: Gastrointestinal reflux

EXAM:
ULTRASOUND ABDOMEN LIMITED RIGHT UPPER QUADRANT

[Series 1: us abdomen limited ruq mc & wl · 15 of 48 slices shown]
[im 1/48]
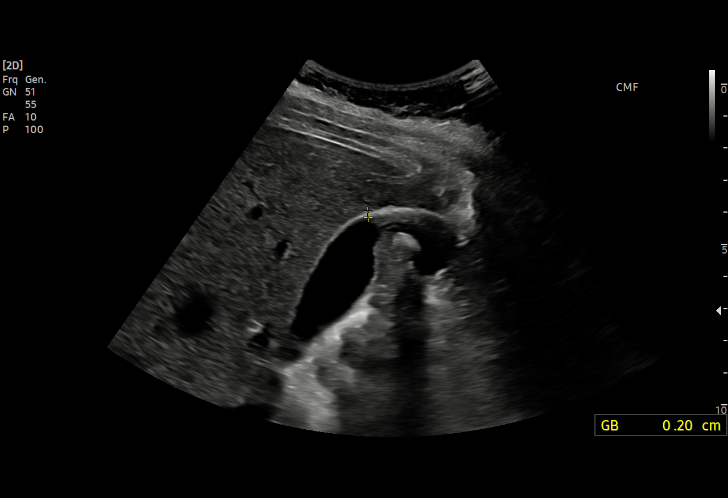
[im 4/48]
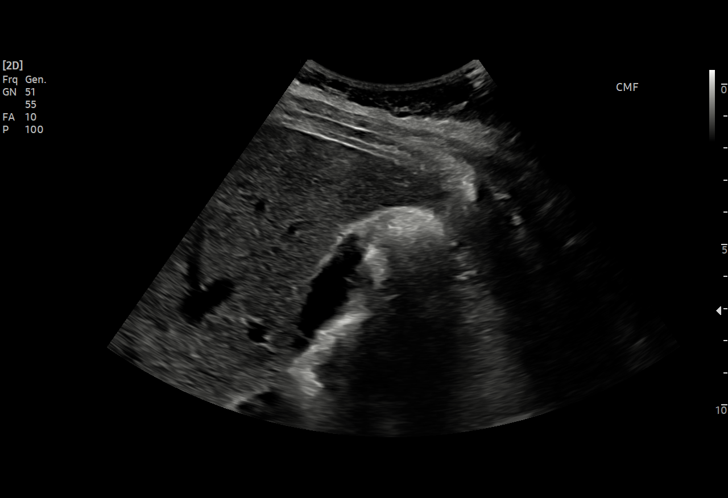
[im 8/48]
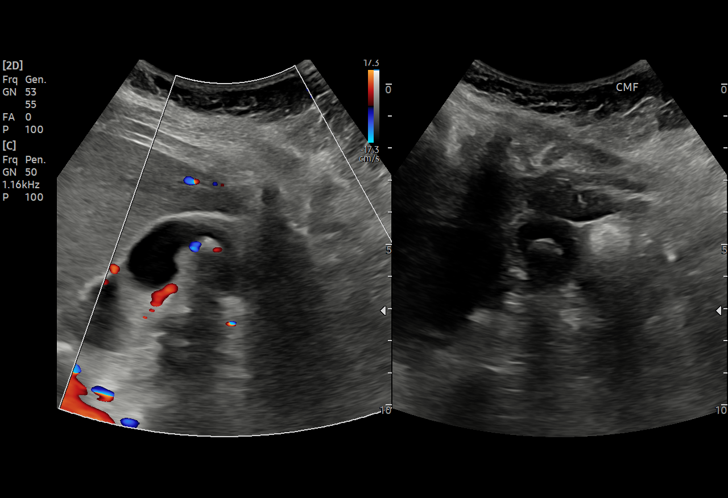
[im 10/48]
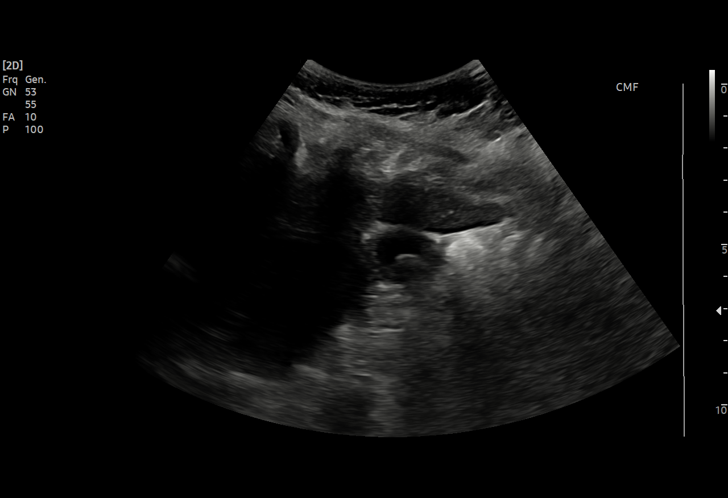
[im 14/48]
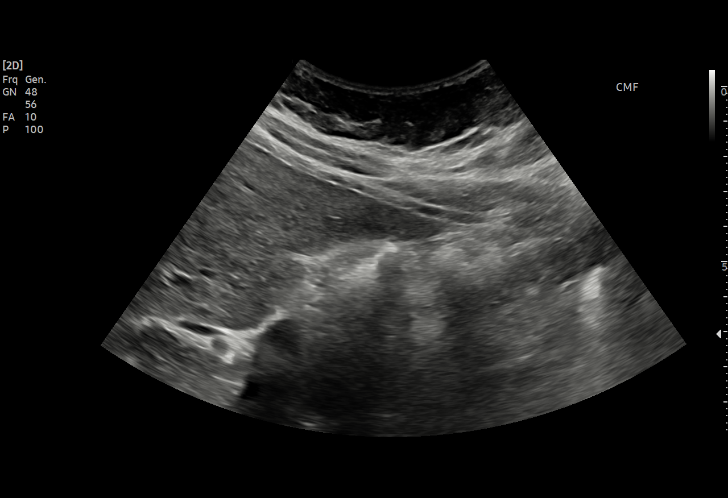
[im 18/48]
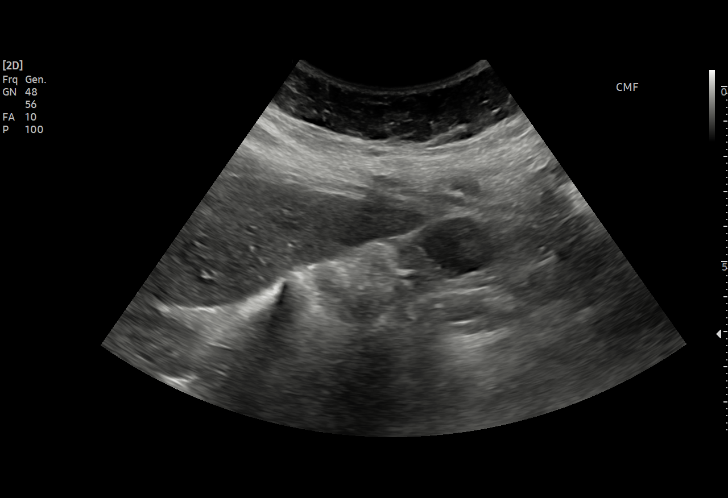
[im 20/48]
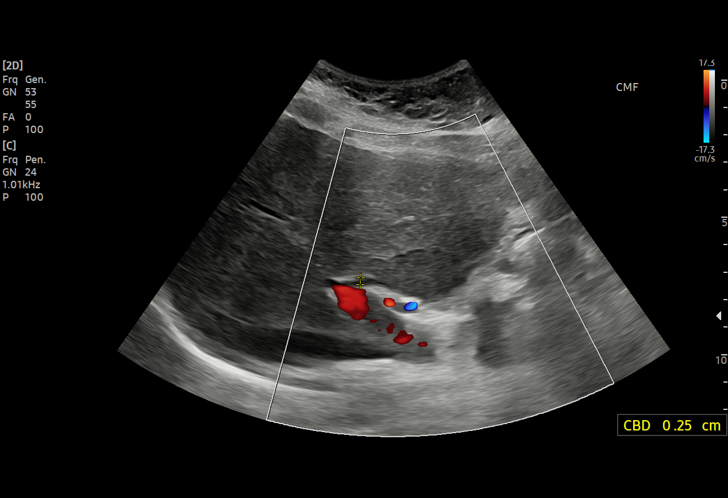
[im 24/48]
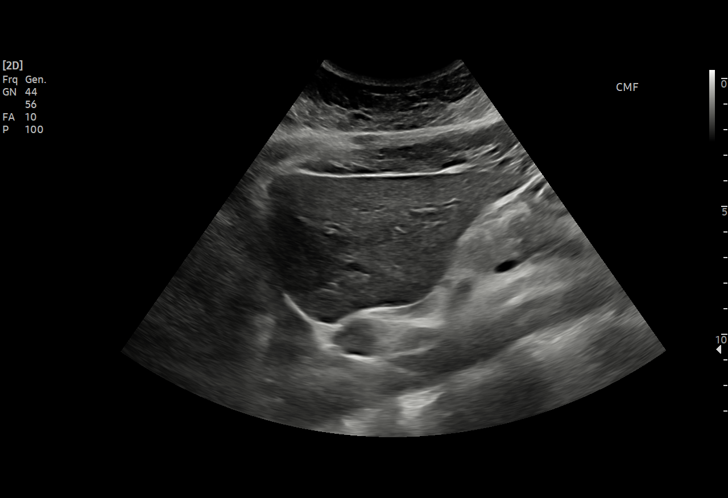
[im 28/48]
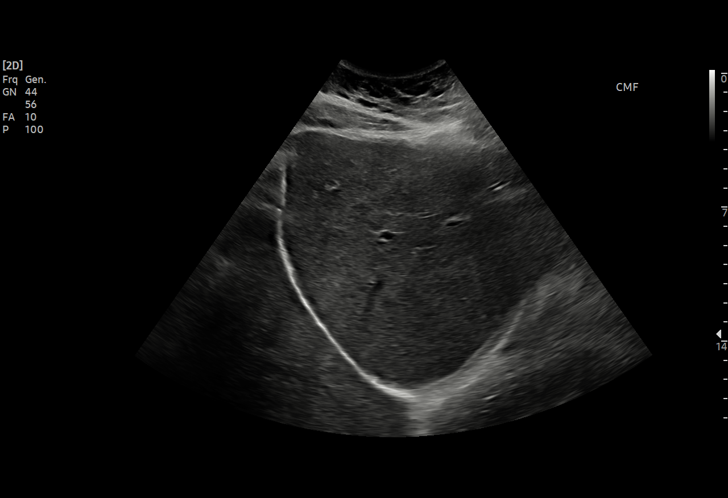
[im 30/48]
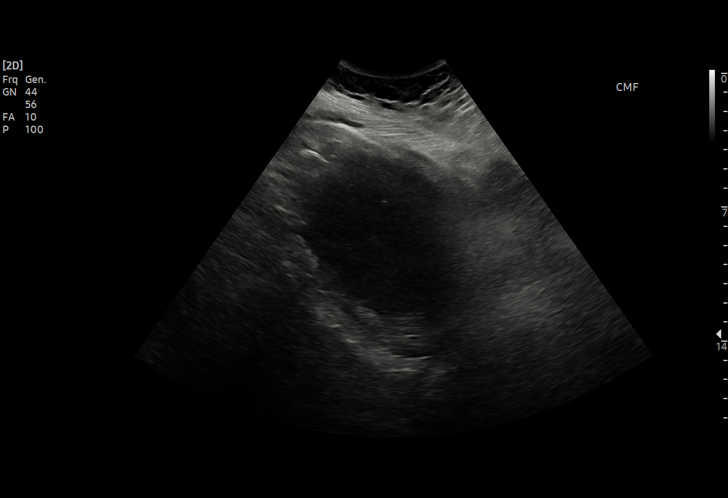
[im 34/48]
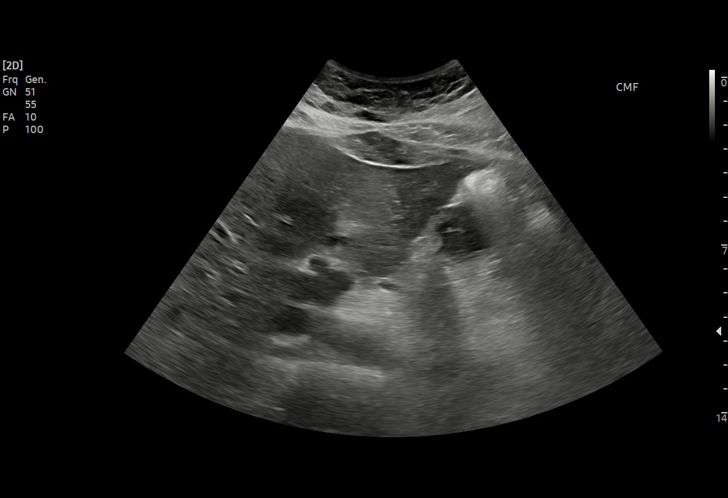
[im 38/48]
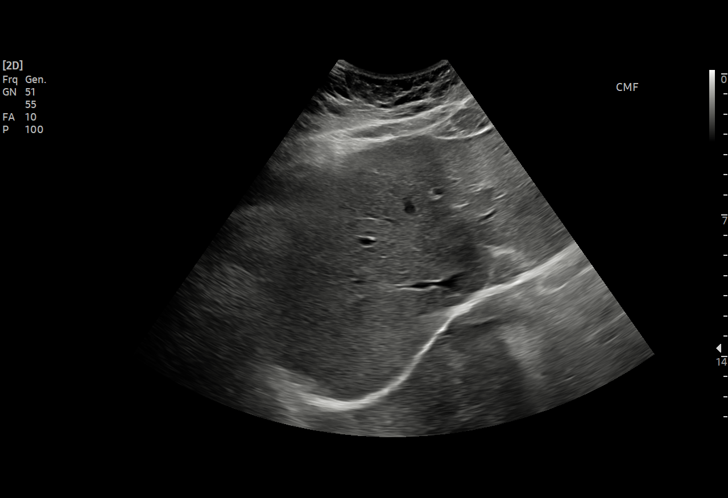
[im 40/48]
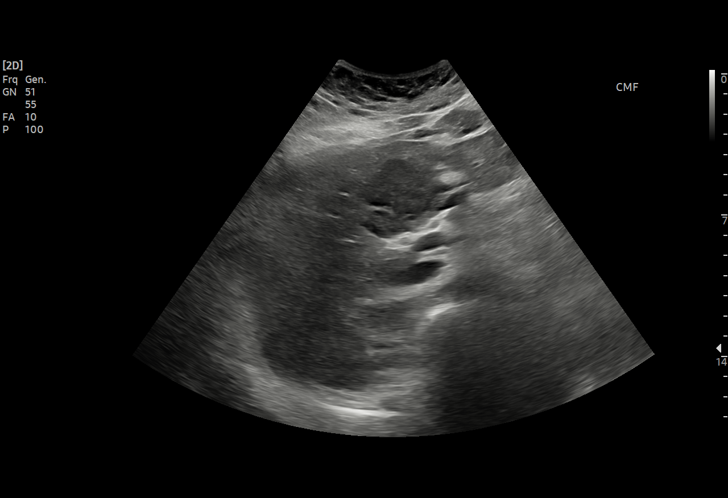
[im 44/48]
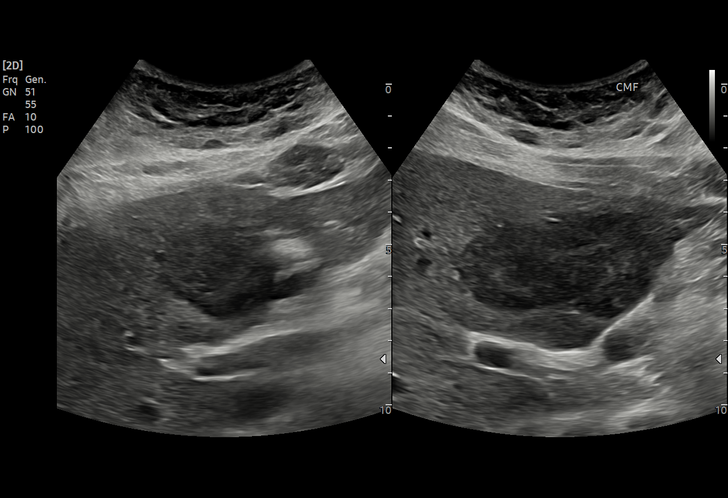
[im 48/48]
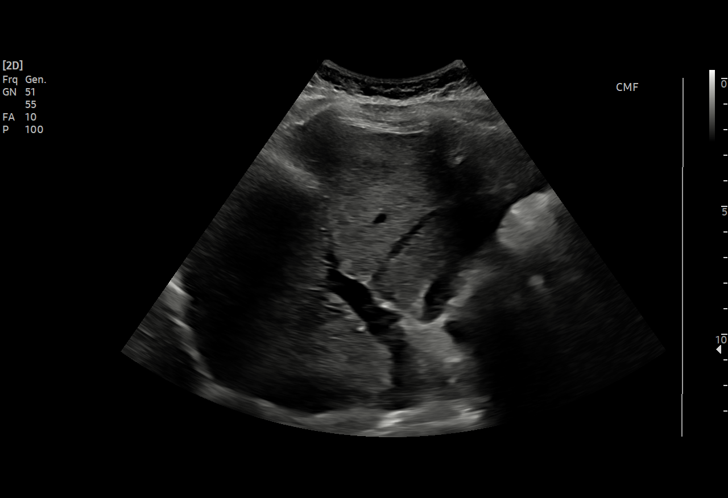

[15 of 25 positions shown; findings below may reference images not displayed]

FINDINGS: Gallbladder:

There is a shadowing stone measuring 1.1 cm. No gallbladder wall
thickening. No sonographic Murphy sign noted by sonographer.

Common bile duct:

Diameter: 0.3 cm, within normal limits

Liver:

There is a possible hypoechoic heterogeneous mass in the liver
measuring 6.2 x 2.5 x 3.7 cm. Within normal limits in parenchymal
echogenicity. Portal vein is patent on color Doppler imaging with
normal direction of blood flow towards the liver.

Other: None.
IMPRESSION: 1.  Cholelithiasis without evidence of acute cholecystitis.

2. Possible mass in the liver measuring up to 6.2 cm. Recommend
contrast enhanced liver MRI for further evaluation.

These results will be called to the ordering clinician or
representative by the Radiologist Assistant, and communication
documented in the PACS or [REDACTED].

## 2021-09-09 NOTE — Progress Notes (Signed)
Agree with plan RG 

## 2021-09-16 ENCOUNTER — Other Ambulatory Visit: Payer: Self-pay

## 2021-09-18 NOTE — Telephone Encounter (Signed)
Information on PA finally available: ? ?PA Case: [id number],  ?Status: Closed,  ?Closed Reason Code: FM Product not covered by this plan. Prior Authorization not available., ? ?Patient's insurance will not cover Hyoscyamine, Please advise of any change. ?

## 2021-09-22 ENCOUNTER — Ambulatory Visit: Payer: 59

## 2021-09-22 ENCOUNTER — Other Ambulatory Visit: Payer: Self-pay | Admitting: Physician Assistant

## 2021-09-22 ENCOUNTER — Ambulatory Visit (HOSPITAL_COMMUNITY)
Admission: RE | Admit: 2021-09-22 | Discharge: 2021-09-22 | Disposition: A | Discharge status: Home / Self Care | Payer: 59 | Source: Ambulatory Visit | Attending: Physician Assistant | Admitting: Physician Assistant

## 2021-09-22 ENCOUNTER — Telehealth: Payer: Self-pay | Admitting: Physician Assistant

## 2021-09-22 DIAGNOSIS — R160 Hepatomegaly, not elsewhere classified: Secondary | ICD-10-CM

## 2021-09-22 IMAGING — MR MR ABDOMEN WO/W CM MRCP
19 of 20 series · 46 of 48 positions shown · IV contrast (gadavist)
Comparison: Ultrasound [DATE]

CLINICAL DATA: Follow-up hepatic masses.

EXAM:
MRI ABDOMEN WITHOUT AND WITH CONTRAST (INCLUDING MRCP)
TECHNIQUE: Multiplanar multisequence MR imaging of the abdomen was performed
both before and after the administration of intravenous contrast.
Heavily T2-weighted images of the biliary and pancreatic ducts were
obtained, and three-dimensional MRCP images were rendered by post
processing.
CONTRAST:  9mL GADAVIST GADOBUTROL 1 MMOL/ML IV SOLN

[Series 3: T2 fat-sat · axial · 6.0mm · 1.25mm/px · z∈[-61,+191]mm · 2 of 36 slices shown]
[im 1/36]
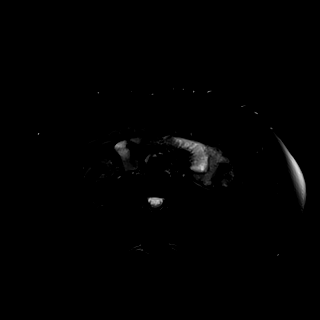
[im 36/36]
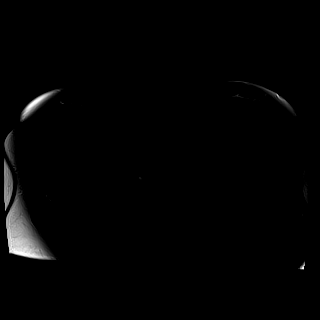

[Series 5: T2 · coronal · 6.0mm · 1.56mm/px · 2 of 30 slices shown (1 of 2)]
[im 1/30]
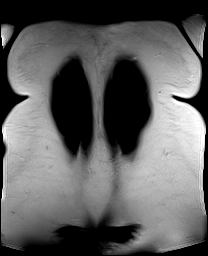
[im 30/30]
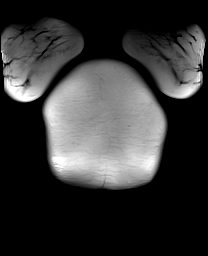

[Series 7: DWI · axial · 6.0mm · 1.49mm/px · z∈[-71,+181]mm · 2 of 72 slices shown (1 of 2)]
[im 1/72]
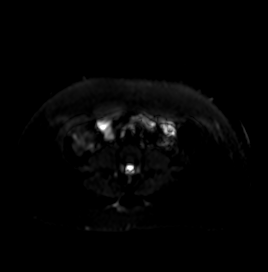
[im 72/72]
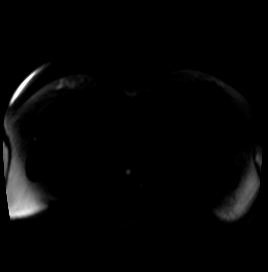

[Series 8: DWI · axial · 6.0mm · 1.49mm/px · 1 of 36 slices shown (2 of 2)]
[im 1/36]
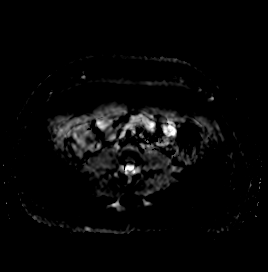

[Series 9: T1 · axial · 3.0mm · 1.25mm/px · z∈[-111,+150]mm · 3 of 88 slices shown (1 of 2)]
[im 1/88]
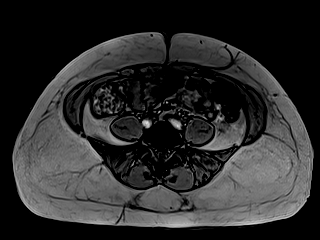
[im 44/88]
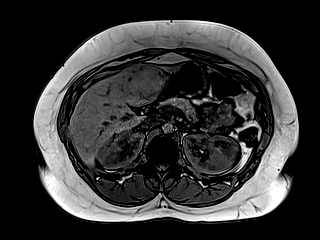
[im 88/88]
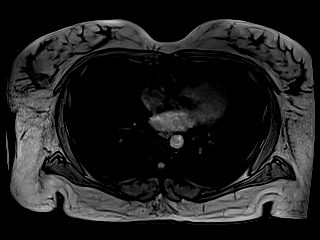

[Series 10: T1 · axial · 3.0mm · 1.25mm/px · z∈[-111,+150]mm · 3 of 88 slices shown (2 of 2)]
[im 1/88]
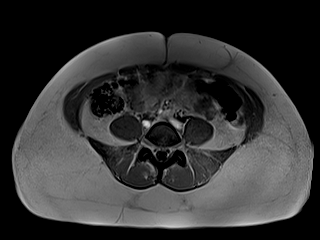
[im 44/88]
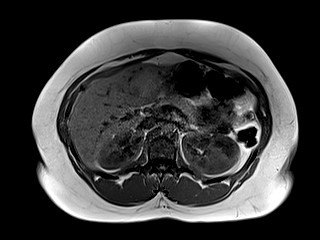
[im 88/88]
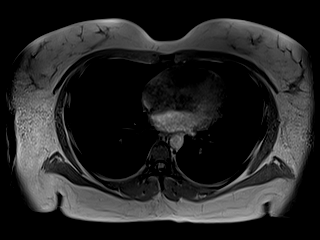

[Series 11: cor obl thk · sagittal · 50.0mm · 0.78mm/px · 1 of 9 slices shown]
[im 1/9]
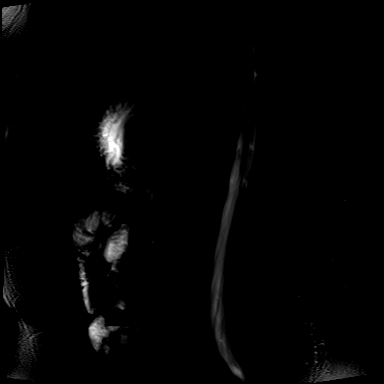

[Series 13: T2 · axial · 6.0mm · 1.56mm/px · 1 of 35 slices shown (2 of 2)]
[im 1/35]
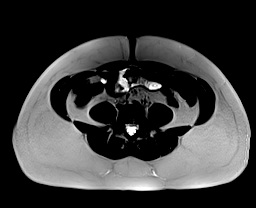

[Series 15: cor_3d_spc_trig · coronal · 1.0mm · 0.49mm/px · 2 of 72 slices shown]
[im 1/72]
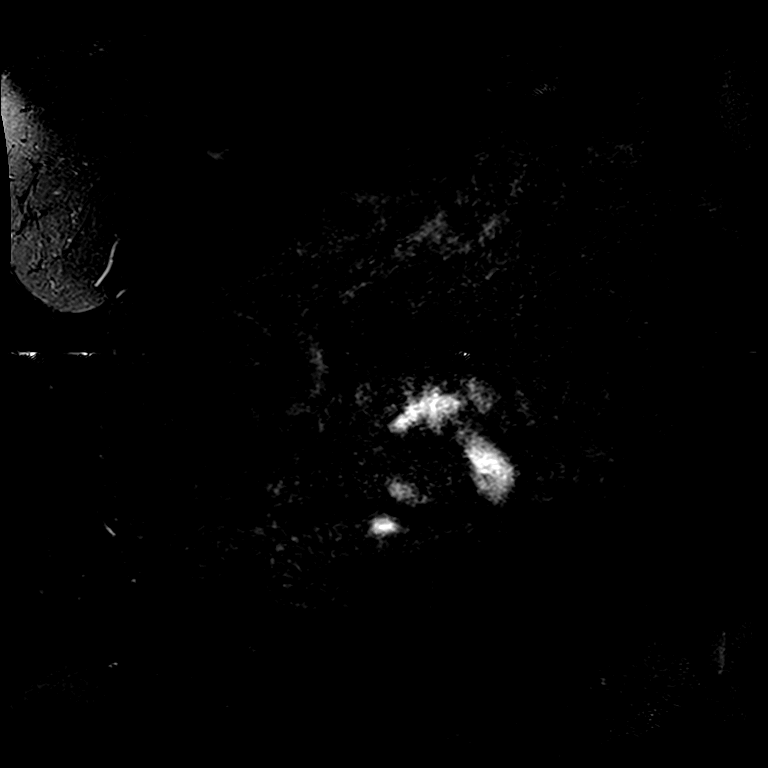
[im 72/72]
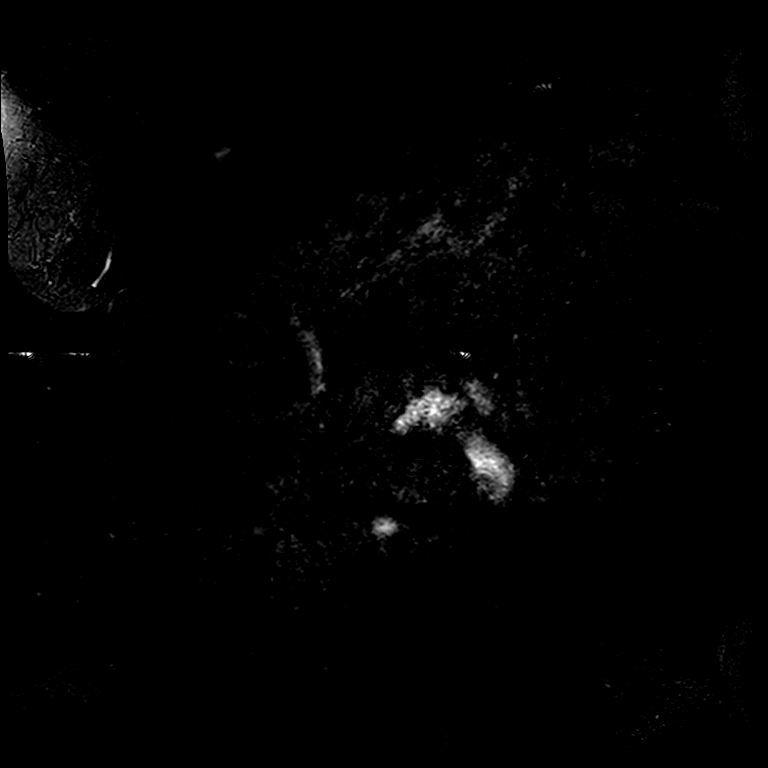

[Series 18: T1 dynamic · axial · 3.0mm · 1.25mm/px · z∈[-123,+162]mm · 3 of 96 slices shown (1 of 10)]
[im 1/96]
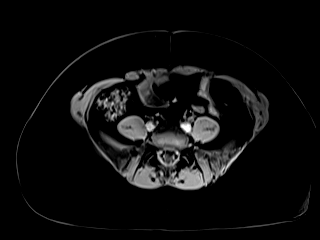
[im 48/96]
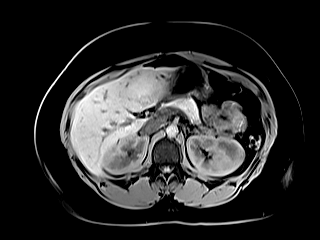
[im 96/96]
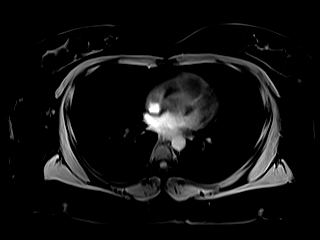

[Series 22: T1 dynamic · axial · 3.0mm · 1.25mm/px · z∈[-123,+162]mm · 3 of 96 slices shown (2 of 10)]
[im 1/96]
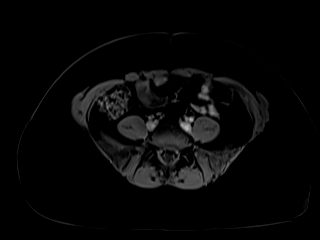
[im 48/96]
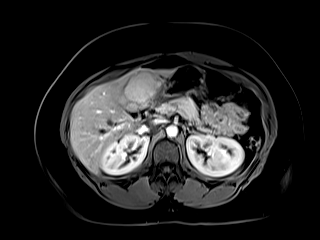
[im 96/96]
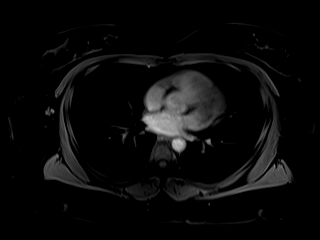

[Series 23: T1 dynamic · axial · 3.0mm · 1.25mm/px · z∈[-123,+162]mm · 3 of 96 slices shown (3 of 10)]
[im 1/96]
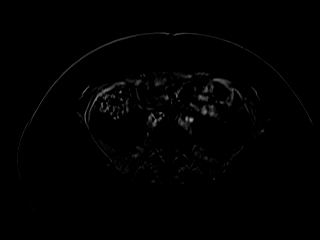
[im 48/96]
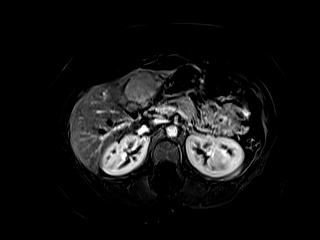
[im 96/96]
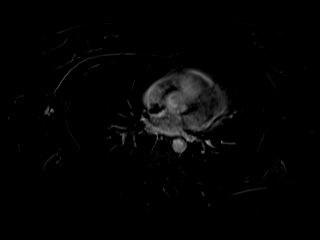

[Series 26: T1 dynamic · axial · 3.0mm · 1.25mm/px · z∈[-123,+162]mm · 3 of 96 slices shown (4 of 10)]
[im 1/96]
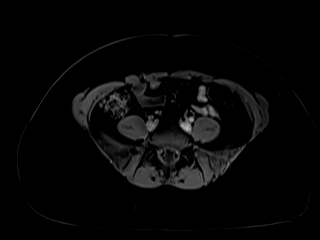
[im 48/96]
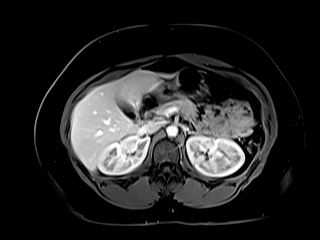
[im 96/96]
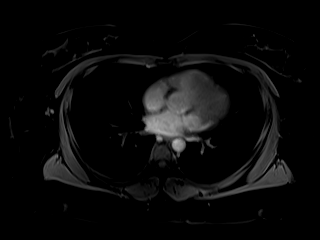

[Series 27: T1 dynamic · axial · 3.0mm · 1.25mm/px · z∈[-123,+162]mm · 3 of 96 slices shown (5 of 10)]
[im 1/96]
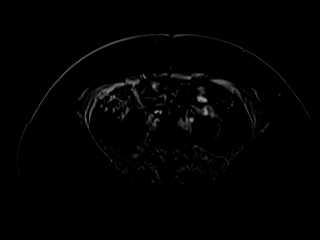
[im 48/96]
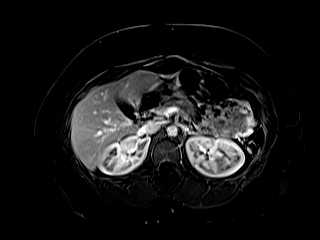
[im 96/96]
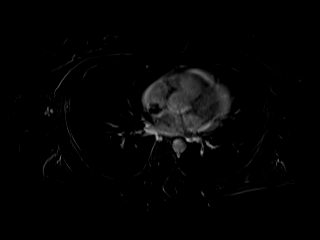

[Series 30: T1 dynamic · axial · 3.0mm · 1.25mm/px · z∈[-123,+162]mm · 3 of 96 slices shown (6 of 10)]
[im 1/96]
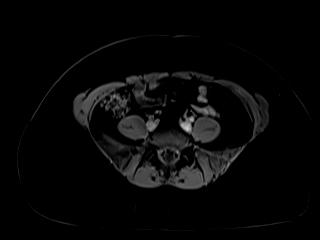
[im 48/96]
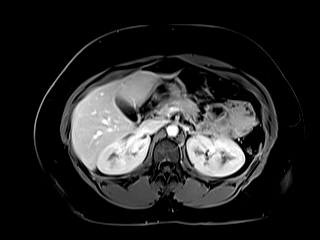
[im 96/96]
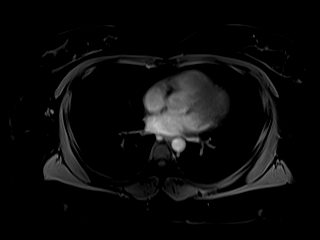

[Series 31: T1 dynamic · axial · 3.0mm · 1.25mm/px · z∈[-123,+162]mm · 3 of 96 slices shown (7 of 10)]
[im 1/96]
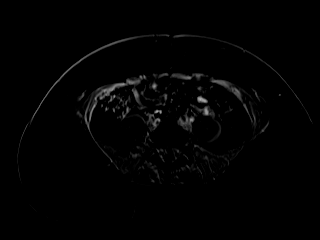
[im 48/96]
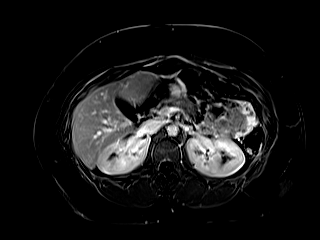
[im 96/96]
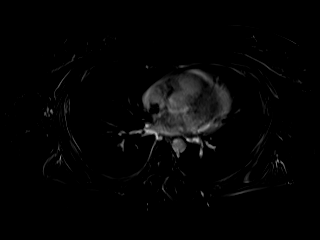

[Series 33: T1 dynamic · coronal · 3.0mm · 1.41mm/px · 2 of 72 slices shown (8 of 10)]
[im 1/72]
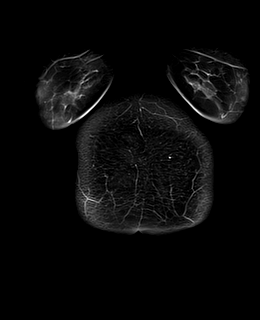
[im 72/72]
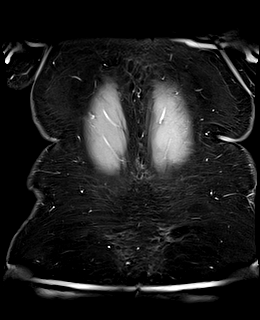

[Series 36: T1 dynamic · axial · 3.0mm · 1.25mm/px · z∈[-123,+162]mm · 3 of 96 slices shown (9 of 10)]
[im 1/96]
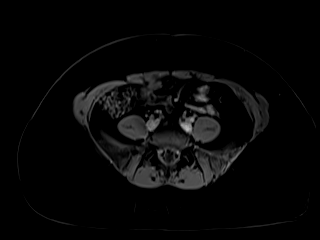
[im 48/96]
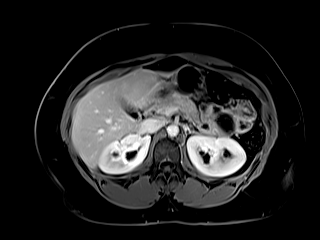
[im 96/96]
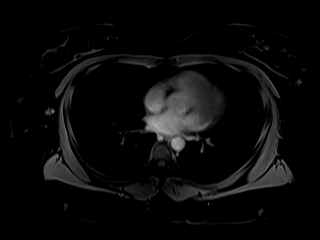

[Series 37: T1 dynamic · axial · 3.0mm · 1.25mm/px · z∈[-123,+162]mm · 3 of 96 slices shown (10 of 10)]
[im 1/96]
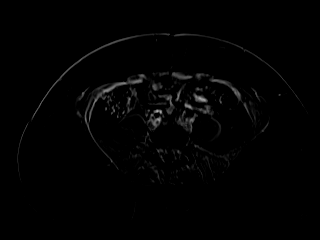
[im 48/96]
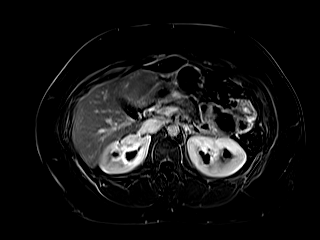
[im 96/96]
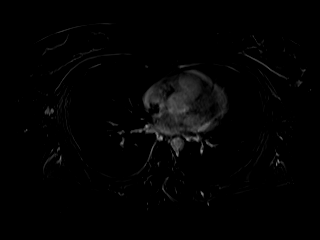

[46 of 48 positions shown; findings below may reference images not displayed]

FINDINGS: Lower chest: No acute abnormality.

Hepatobiliary: No hepatic steatosis. Bilobar hepatic masses which
are minimally T2 hyperintense predominantly isointense to background
liver noncontrast T1 demonstrating arterial hyperenhancement without
washout. For reference:

-segment IV B lesion measures 4.2 cm on image 48/22.

-segment VII hepatic lesion measures 2.5 cm on image 30/22.

Cholelithiasis without findings of acute cholecystitis.

Pancreas: No mass, inflammatory changes, or other parenchymal
abnormality identified.

Spleen:  Within normal limits in size and appearance.

Adrenals/Urinary Tract: No masses identified. No evidence of
hydronephrosis.

Stomach/Bowel: Visualized portions within the abdomen are
unremarkable.

Vascular/Lymphatic: No pathologically enlarged lymph nodes
identified. No abdominal aortic aneurysm demonstrated.

Other:  No significant abdominal free fluid.

Musculoskeletal: No suspicious bone lesions identified.
IMPRESSION: 1. Bilobar arterially enhancing hepatic masses most likely reflect
hepatic adenomas or focal nodular hyperplasia. Consider cessation of
oral contraceptives, patient is currently on this medication, and
follow-up MRI in 6 months with and without EOVIST contrast to ensure
stability and for more definitive characterization.
2. Cholelithiasis without findings of acute cholecystitis.

## 2021-09-22 IMAGING — MR MR 3D RECON AT [facility]
16 of 21 series · 43 of 48 positions shown · IV contrast (gadavist)
Comparison: Ultrasound [DATE]

CLINICAL DATA: Follow-up hepatic masses.

EXAM:
MRI ABDOMEN WITHOUT AND WITH CONTRAST (INCLUDING MRCP)
TECHNIQUE: Multiplanar multisequence MR imaging of the abdomen was performed
both before and after the administration of intravenous contrast.
Heavily T2-weighted images of the biliary and pancreatic ducts were
obtained, and three-dimensional MRCP images were rendered by post
processing.
CONTRAST:  9mL GADAVIST GADOBUTROL 1 MMOL/ML IV SOLN

[Series 4: T2 fat-sat · axial · 6.0mm · 1.25mm/px · 1 of 36 slices shown]
[im 1/36]
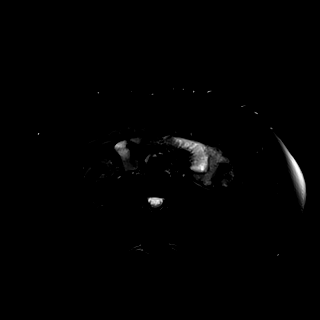

[Series 6: T2 · coronal · 6.0mm · 1.56mm/px · 1 of 30 slices shown (1 of 2)]
[im 1/30]
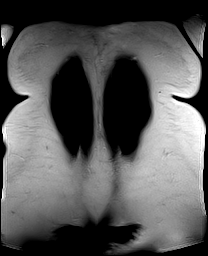

[Series 8: DWI · axial · 6.0mm · 1.49mm/px · z∈[-71,+181]mm · 2 of 72 slices shown (1 of 2)]
[im 1/72]
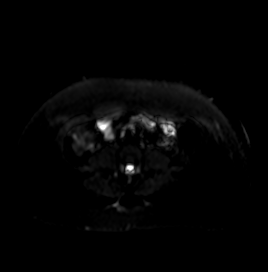
[im 72/72]
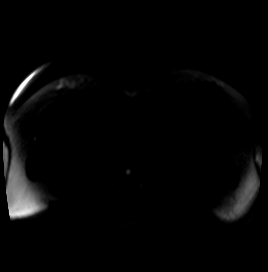

[Series 9: DWI · axial · 6.0mm · 1.49mm/px · 1 of 36 slices shown (2 of 2)]
[im 1/36]
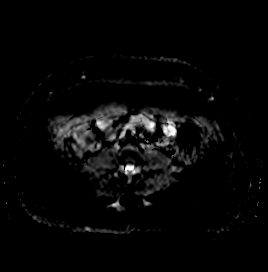

[Series 10: T1 · axial · 3.0mm · 1.25mm/px · z∈[-111,+150]mm · 2 of 88 slices shown (1 of 2)]
[im 1/88]
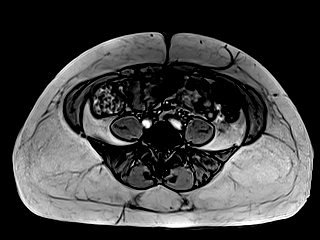
[im 88/88]
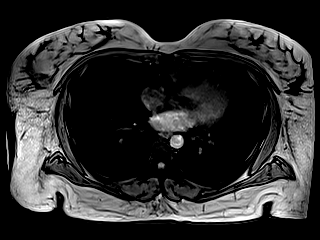

[Series 11: T1 · axial · 3.0mm · 1.25mm/px · z∈[-111,+150]mm · 3 of 88 slices shown (2 of 2)]
[im 1/88]
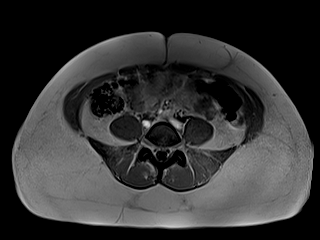
[im 44/88]
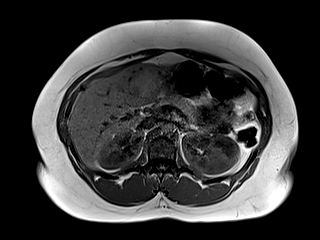
[im 88/88]
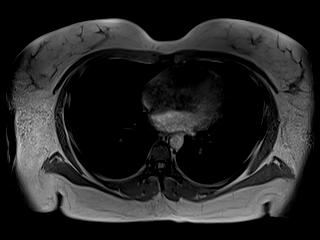

[Series 12: cor obl thk · sagittal · 50.0mm · 0.78mm/px · 1 of 9 slices shown]
[im 1/9]
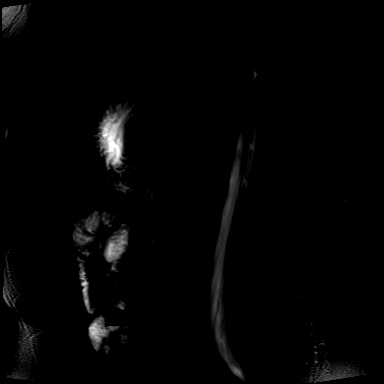

[Series 14: T2 · axial · 6.0mm · 1.56mm/px · 1 of 35 slices shown (2 of 2)]
[im 1/35]
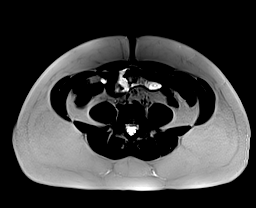

[Series 20: T1 dynamic · axial · 3.0mm · 1.25mm/px · z∈[-123,+162]mm · 4 of 96 slices shown (1 of 8)]
[im 1/96]
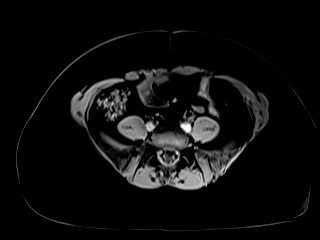
[im 32/96]
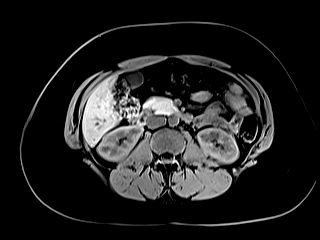
[im 64/96]
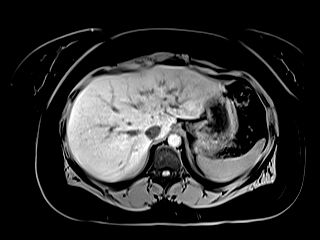
[im 96/96]
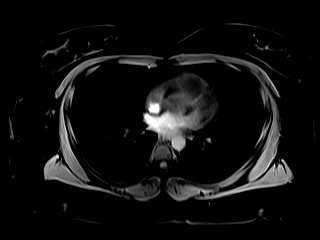

[Series 24: T1 dynamic · axial · 3.0mm · 1.25mm/px · z∈[-123,+162]mm · 4 of 96 slices shown (2 of 8)]
[im 1/96]
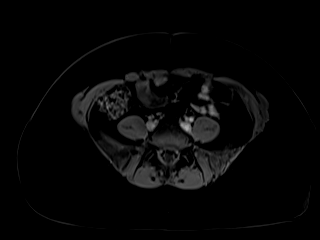
[im 32/96]
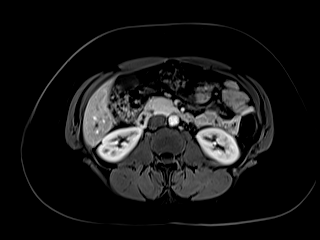
[im 64/96]
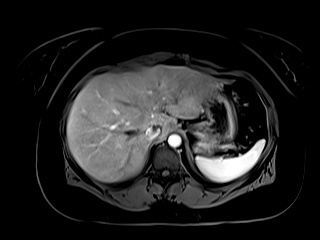
[im 96/96]
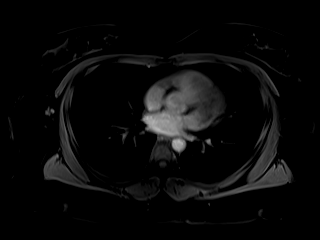

[Series 25: T1 dynamic · axial · 3.0mm · 1.25mm/px · z∈[-123,+162]mm · 4 of 96 slices shown (3 of 8)]
[im 1/96]
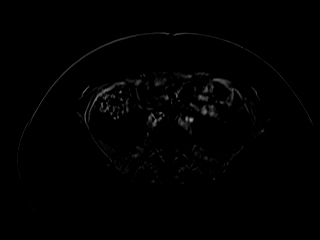
[im 32/96]
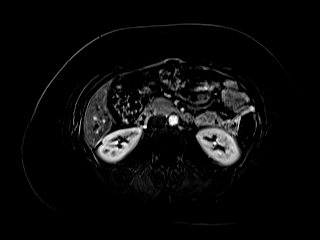
[im 64/96]
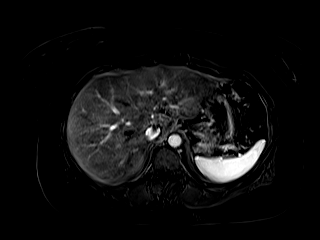
[im 96/96]
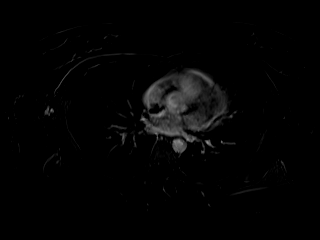

[Series 28: T1 dynamic · axial · 3.0mm · 1.25mm/px · z∈[-123,+162]mm · 4 of 96 slices shown (4 of 8)]
[im 1/96]
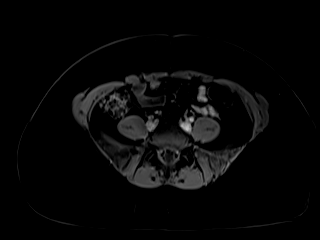
[im 32/96]
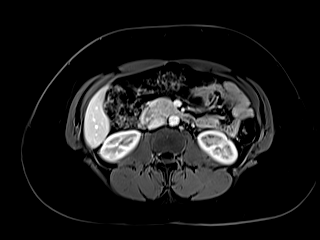
[im 64/96]
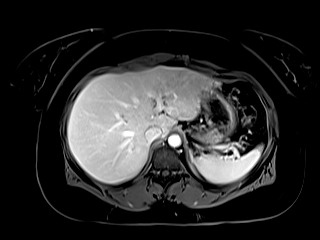
[im 96/96]
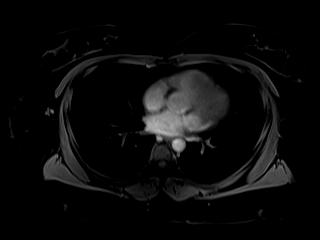

[Series 29: T1 dynamic · axial · 3.0mm · 1.25mm/px · z∈[-123,+162]mm · 4 of 96 slices shown (5 of 8)]
[im 1/96]
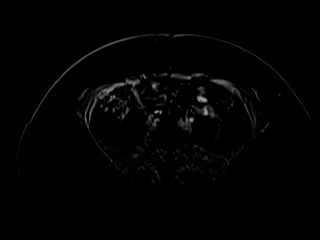
[im 32/96]
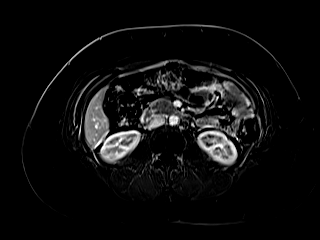
[im 64/96]
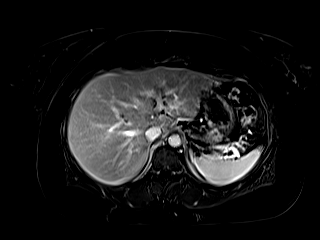
[im 96/96]
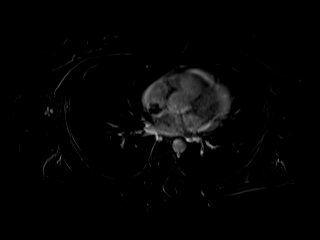

[Series 32: T1 dynamic · axial · 3.0mm · 1.25mm/px · z∈[-123,+162]mm · 4 of 96 slices shown (6 of 8)]
[im 1/96]
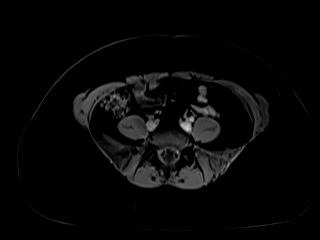
[im 32/96]
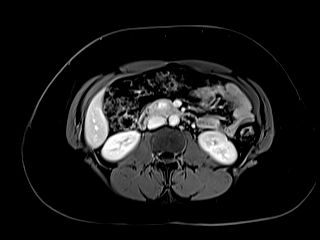
[im 64/96]
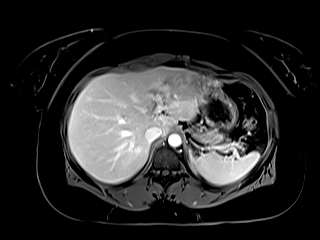
[im 96/96]
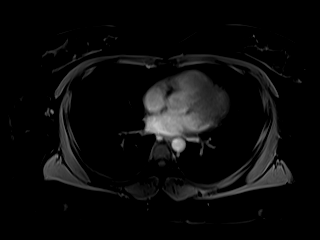

[Series 33: T1 dynamic · axial · 3.0mm · 1.25mm/px · z∈[-123,+162]mm · 4 of 96 slices shown (7 of 8)]
[im 1/96]
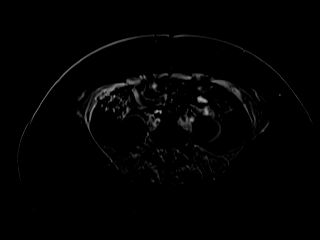
[im 32/96]
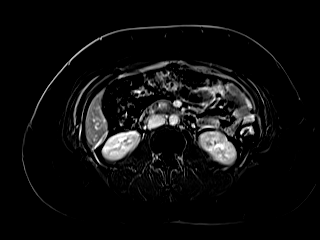
[im 64/96]
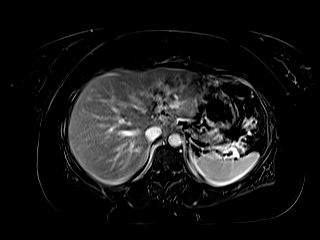
[im 96/96]
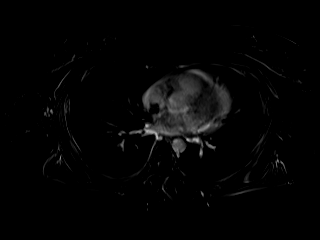

[Series 35: T1 dynamic · coronal · 3.0mm · 1.41mm/px · 3 of 72 slices shown (8 of 8)]
[im 1/72]
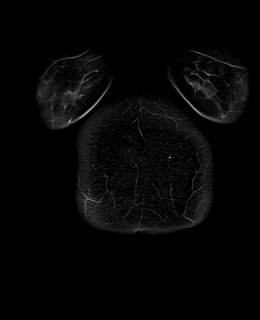
[im 36/72]
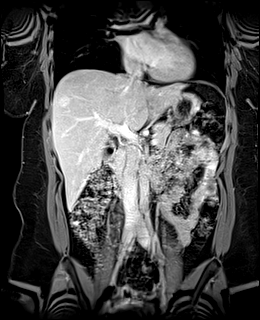
[im 72/72]
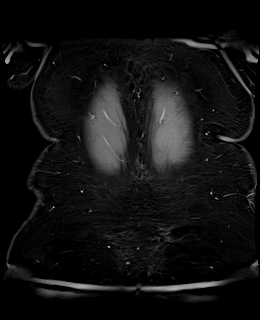

[43 of 48 positions shown; findings below may reference images not displayed]

FINDINGS: Lower chest: No acute abnormality.

Hepatobiliary: No hepatic steatosis. Bilobar hepatic masses which
are minimally T2 hyperintense predominantly isointense to background
liver noncontrast T1 demonstrating arterial hyperenhancement without
washout. For reference:

-segment IV B lesion measures 4.2 cm on image 48/22.

-segment VII hepatic lesion measures 2.5 cm on image 30/22.

Cholelithiasis without findings of acute cholecystitis.

Pancreas: No mass, inflammatory changes, or other parenchymal
abnormality identified.

Spleen:  Within normal limits in size and appearance.

Adrenals/Urinary Tract: No masses identified. No evidence of
hydronephrosis.

Stomach/Bowel: Visualized portions within the abdomen are
unremarkable.

Vascular/Lymphatic: No pathologically enlarged lymph nodes
identified. No abdominal aortic aneurysm demonstrated.

Other:  No significant abdominal free fluid.

Musculoskeletal: No suspicious bone lesions identified.
IMPRESSION: 1. Bilobar arterially enhancing hepatic masses most likely reflect
hepatic adenomas or focal nodular hyperplasia. Consider cessation of
oral contraceptives, patient is currently on this medication, and
follow-up MRI in 6 months with and without EOVIST contrast to ensure
stability and for more definitive characterization.
2. Cholelithiasis without findings of acute cholecystitis.

## 2021-09-22 MED ORDER — GADOBUTROL 1 MMOL/ML IV SOLN
9.0000 mL | Freq: Once | INTRAVENOUS | Status: AC | PRN
  Administered 2021-09-22: 9 mL via INTRAVENOUS

## 2021-09-22 NOTE — Telephone Encounter (Signed)
Left detailed message about her insurance not covering Hyoscyamine. Advised about using [facility] to help with Coverage. Advised patient to call if she would like for the prescription to go to a different pharmacy ?

## 2021-09-22 NOTE — Telephone Encounter (Signed)
Patient is returning a call regarding lab results.

## 2021-09-23 NOTE — Telephone Encounter (Signed)
Patient called requesting results from MRCP that was completed yesterday. I made her aware that Beverly Pearson, [place] still needs to review results, and once she has done so then we can let her know her results & recommendations.  ?

## 2021-09-24 ENCOUNTER — Other Ambulatory Visit: Payer: Self-pay

## 2021-09-24 MED ORDER — OMEPRAZOLE 20 MG PO CPDR: 20.0000 mg | DELAYED_RELEASE_CAPSULE | Freq: Every day | ORAL | 0 refills | Status: DC

## 2022-01-02 NOTE — Progress Notes (Signed)
Subjective:    Patient ID: Beverly Pearson, female    DOB: 10-Aug-1990, 31 y.o.   MRN: [id number]  Chief Complaint  Patient presents with   Follow-up    HPI Patient is in today for a follow up on chronic medical concerns. No recent febrile illness or acute hospitalizations. Her right upper quadrant has improved with an improvement in her diet, she is eating at home and minimizing fatty and processed foods. Otherwise she notes continued trouble with sleep. She has not had any relief with previous efforts and it has been a [place] standing issue. Denies CP/palp/SOB/HA/congestion/fevers/GI or GU c/o. Taking meds as prescribed   Past Medical History:  Diagnosis Date   Anxiety 02/17/2011   Constipation    Depression with anxiety 03/03/2011   GERD (gastroesophageal reflux disease)    Herpes simplex without mention of complication    Insomnia 09/01/2011   Overweight 04/21/2015   Reflux 02/17/2011   Reflux 03/03/2011   Reflux 09/16/2011   Tobacco use 12/25/2016    Past Surgical History:  Procedure Laterality Date   TONSILLECTOMY AND ADENOIDECTOMY     age 52   WISDOM TOOTH EXTRACTION      Family History  Problem Relation Age of Onset   Diabetes Mother        type 2   Hypertension Mother    Diabetes Maternal Grandmother        type 2   Cancer Maternal Grandmother 67       ovarian   Hypertension Maternal Grandmother    Kidney failure Maternal Grandfather    Hypertension Maternal Grandfather    Kidney disease Maternal Grandfather    Diabetes Paternal Grandmother        type 2   Hypertension Paternal Grandmother    Emphysema Paternal Grandfather        smoke   COPD Paternal Grandfather    Cancer Paternal Grandfather 72       prostate cancer   Colon cancer Neg Hx     Social History   Socioeconomic History   Marital status: Single    Spouse name: Not on file   Number of children: 0   Years of education: Not on file   Highest education level: Not on file  Occupational History    Occupation: Unemployed  Tobacco Use   Smoking status: Former    Types: Cigarettes    Quit date: 12/04/2020    Years since quitting: 1.0   Smokeless tobacco: Never  Vaping Use   Vaping Use: Never used  Substance and Sexual Activity   Alcohol use: Yes    Comment: socially   Drug use: Not Currently    Types: Marijuana   Sexual activity: Never    Birth control/protection: Pill  Other Topics Concern   Not on file  Social History Narrative   Occasional soda    Social Determinants of Health   Financial Resource Strain: Not on file  Food Insecurity: Not on file  Transportation Needs: Not on file  Physical Activity: Not on file  Stress: Not on file  Social Connections: Not on file  Intimate Partner Violence: Not on file    Outpatient Medications Prior to Visit  Medication Sig Dispense Refill   hyoscyamine (LEVSIN) 0.125 MG tablet Take 1 tablet (0.125 mg total) by mouth every 6 (six) hours as needed for cramping (AB pain). 20 tablet 0   LORazepam (ATIVAN) 0.5 MG tablet Take 1 tablet (0.5 mg total) by mouth 2 (two) times  daily as needed. for anxiety 30 tablet 1   Multiple Vitamin (MULTIVITAMIN) tablet Take 1 tablet by mouth daily as needed.     omeprazole (PRILOSEC) 20 MG capsule Take 1 capsule (20 mg total) by mouth daily for 14 days. Take daily before meals. 14 capsule 0   metoprolol tartrate (LOPRESSOR) 25 MG tablet Take 1 tablet (25 mg total) by mouth 2 (two) times daily. (Patient not taking: Reported on 01/05/2022) 60 tablet 5   Levonorgestrel-Ethinyl Estradiol (SEASONIQUE) 0.15-0.03 &0.01 MG tablet Take 1 tablet by mouth daily. 91 tablet 3   No facility-administered medications prior to visit.    Allergies  Allergen Reactions   Other Other (See Comments)    HOUSE DUST, sneezing and runny nose    Review of Systems  Constitutional:  Negative for fever and malaise/fatigue.  HENT:  Negative for congestion.   Eyes:  Negative for blurred vision.  Respiratory:  Negative for  shortness of breath.   Cardiovascular:  Negative for chest pain, palpitations and leg swelling.  Gastrointestinal:  Positive for abdominal pain. Negative for blood in stool and nausea.  Genitourinary:  Negative for dysuria and frequency.  Musculoskeletal:  Negative for falls.  Skin:  Negative for rash.  Neurological:  Negative for dizziness, loss of consciousness and headaches.  Endo/Heme/Allergies:  Negative for environmental allergies.  Psychiatric/Behavioral:  Negative for depression. The patient is not nervous/anxious.        Objective:    Physical Exam Constitutional:      General: She is not in acute distress.    Appearance: She is well-developed.  HENT:     Head: Normocephalic and atraumatic.  Eyes:     Conjunctiva/sclera: Conjunctivae normal.  Neck:     Thyroid: No thyromegaly.  Cardiovascular:     Rate and Rhythm: Normal rate and regular rhythm.     Heart sounds: Normal heart sounds. No murmur heard. Pulmonary:     Effort: Pulmonary effort is normal. No respiratory distress.     Breath sounds: Normal breath sounds.  Abdominal:     General: Bowel sounds are normal. There is no distension.     Palpations: Abdomen is soft. There is no mass.     Tenderness: There is no abdominal tenderness.  Musculoskeletal:     Cervical back: Neck supple.  Lymphadenopathy:     Cervical: No cervical adenopathy.  Skin:    General: Skin is warm and dry.  Neurological:     Mental Status: She is alert and oriented to person, place, and time.  Psychiatric:        Behavior: Behavior normal.     BP (!) 142/90 (BP Location: Right Arm, Patient Position: Sitting, Cuff Size: Normal)   Pulse 86   Resp 20   Ht 5\' 6"  (1.676 m)   Wt 201 lb (91.2 kg)   SpO2 97%   BMI 32.44 kg/m  Wt Readings from Last 3 Encounters:  01/05/22 201 lb (91.2 kg)  09/01/21 191 lb (86.6 kg)  07/08/21 190 lb (86.2 kg)    Diabetic Foot Exam - Simple   No data filed    Lab Results  Component Value Date    WBC 7.7 01/05/2022   HGB 14.1 01/05/2022   HCT 42.8 01/05/2022   PLT 301.0 01/05/2022   GLUCOSE 99 01/05/2022   CHOL 142 07/08/2021   TRIG 85.0 07/08/2021   HDL 61.40 07/08/2021   LDLCALC 63 07/08/2021   ALT 23 01/05/2022   AST 28 01/05/2022   NA  140 01/05/2022   K 4.0 01/05/2022   CL 103 01/05/2022   CREATININE 0.61 01/05/2022   BUN 10 01/05/2022   CO2 27 01/05/2022   TSH 1.64 01/05/2022   HGBA1C 5.9 10/15/2020    Lab Results  Component Value Date   TSH 1.64 01/05/2022   Lab Results  Component Value Date   WBC 7.7 01/05/2022   HGB 14.1 01/05/2022   HCT 42.8 01/05/2022   MCV 86.0 01/05/2022   PLT 301.0 01/05/2022   Lab Results  Component Value Date   NA 140 01/05/2022   K 4.0 01/05/2022   CO2 27 01/05/2022   GLUCOSE 99 01/05/2022   BUN 10 01/05/2022   CREATININE 0.61 01/05/2022   BILITOT 0.6 01/05/2022   ALKPHOS 163 (H) 01/05/2022   AST 28 01/05/2022   ALT 23 01/05/2022   PROT 7.1 01/05/2022   ALBUMIN 4.3 01/05/2022   CALCIUM 9.4 01/05/2022   GFR 120.18 01/05/2022   Lab Results  Component Value Date   CHOL 142 07/08/2021   Lab Results  Component Value Date   HDL 61.40 07/08/2021   Lab Results  Component Value Date   LDLCALC 63 07/08/2021   Lab Results  Component Value Date   TRIG 85.0 07/08/2021   Lab Results  Component Value Date   CHOLHDL 2 07/08/2021   Lab Results  Component Value Date   HGBA1C 5.9 10/15/2020       Assessment & Plan:      Problem List Items Addressed This Visit     Depression with anxiety   Insomnia    Encouraged good sleep hygiene such as dark, quiet room. No blue/green glowing lights such as computer screens in bedroom. No alcohol or stimulants in evening. Cut down on caffeine as able. Regular exercise is helpful but not just prior to bed time. She has tried various options and been unsuccessful. Will try a short course of intermittent ambien 2.5 to 5 mg qhs.      Hypertension - Primary    Well controlled,  no changes to meds. Encouraged heart healthy diet such as the DASH diet and exercise as tolerated.       Relevant Orders   TSH (Completed)   Chronic RUQ pain    Still occurring but less frequently since April when she made some dietary changes, has cut down on fats and fast and processed, is cooking at home and eating better, she is encouraged to minimize fats and to let us know if her symptoms worsen so we can consider referral to general surgery for consultation      Relevant Orders   CBC with Differential/Platelet (Completed)   Comprehensive metabolic panel (Completed)   Liver mass    Found on MRI in April recommendation from radiology is to repeat imaging in 6 months will order.       Other Visit Diagnoses     High risk medication use       Relevant Orders   Drug Monitoring Panel [id number] , Urine   Abdominal pain, unspecified abdominal location       Relevant Orders   Lipase (Completed)   Amylase (Completed)   Liver disease       Relevant Orders   MR Abdomen W Wo Contrast       I have discontinued Beverly S. Bomberger's Levonorgestrel-Ethinyl Estradiol. I am also having her start on zolpidem. Additionally, I am having her maintain her multivitamin, metoprolol tartrate, LORazepam, hyoscyamine, and omeprazole.  Meds ordered this  encounter  Medications   zolpidem (AMBIEN) 5 MG tablet    Sig: Take 1 tablet (5 mg total) by mouth at bedtime as needed for sleep.    Dispense:  30 tablet    Refill:  1

## 2022-01-05 ENCOUNTER — Ambulatory Visit (INDEPENDENT_AMBULATORY_CARE_PROVIDER_SITE_OTHER): Payer: 59 | Admitting: Family Medicine

## 2022-01-05 ENCOUNTER — Encounter: Payer: Self-pay | Admitting: Family Medicine

## 2022-01-05 VITALS — BP 142/90 | HR 86 | Resp 20 | Ht 66.0 in | Wt 201.0 lb

## 2022-01-05 DIAGNOSIS — G4700 Insomnia, unspecified: Secondary | ICD-10-CM | POA: Diagnosis not present

## 2022-01-05 DIAGNOSIS — F418 Other specified anxiety disorders: Secondary | ICD-10-CM | POA: Diagnosis not present

## 2022-01-05 DIAGNOSIS — R1011 Right upper quadrant pain: Secondary | ICD-10-CM | POA: Insufficient documentation

## 2022-01-05 DIAGNOSIS — G8929 Other chronic pain: Secondary | ICD-10-CM | POA: Diagnosis not present

## 2022-01-05 DIAGNOSIS — K769 Liver disease, unspecified: Secondary | ICD-10-CM

## 2022-01-05 DIAGNOSIS — R109 Unspecified abdominal pain: Secondary | ICD-10-CM

## 2022-01-05 DIAGNOSIS — I10 Essential (primary) hypertension: Secondary | ICD-10-CM

## 2022-01-05 DIAGNOSIS — Z79899 Other long term (current) drug therapy: Secondary | ICD-10-CM

## 2022-01-05 DIAGNOSIS — R160 Hepatomegaly, not elsewhere classified: Secondary | ICD-10-CM

## 2022-01-05 LAB — CBC WITH DIFFERENTIAL/PLATELET
Basophils Absolute: 0 10*3/uL (ref 0.0–0.1)
Basophils Relative: 0.4 % (ref 0.0–3.0)
Eosinophils Absolute: 0.1 10*3/uL (ref 0.0–0.7)
Eosinophils Relative: 1.1 % (ref 0.0–5.0)
HCT: 42.8 % (ref 36.0–46.0)
Hemoglobin: 14.1 g/dL (ref 12.0–15.0)
Lymphocytes Relative: 29.8 % (ref 12.0–46.0)
Lymphs Abs: 2.3 10*3/uL (ref 0.7–4.0)
MCHC: 33 g/dL (ref 30.0–36.0)
MCV: 86 fl (ref 78.0–100.0)
Monocytes Absolute: 0.5 10*3/uL (ref 0.1–1.0)
Monocytes Relative: 6.2 % (ref 3.0–12.0)
Neutro Abs: 4.8 10*3/uL (ref 1.4–7.7)
Neutrophils Relative %: 62.5 % (ref 43.0–77.0)
Platelets: 301 10*3/uL (ref 150.0–400.0)
RBC: 4.97 Mil/uL (ref 3.87–5.11)
RDW: 14 % (ref 11.5–15.5)
WBC: 7.7 10*3/uL (ref 4.0–10.5)

## 2022-01-05 LAB — COMPREHENSIVE METABOLIC PANEL
ALT: 23 U/L (ref 0–35)
AST: 28 U/L (ref 0–37)
Albumin: 4.3 g/dL (ref 3.5–5.2)
Alkaline Phosphatase: 163 U/L — ABNORMAL HIGH (ref 39–117)
BUN: 10 mg/dL (ref 6–23)
CO2: 27 mEq/L (ref 19–32)
Calcium: 9.4 mg/dL (ref 8.4–10.5)
Chloride: 103 mEq/L (ref 96–112)
Creatinine, Ser: 0.61 mg/dL (ref 0.40–1.20)
GFR: 120.18 mL/min (ref >60.00)
Glucose, Bld: 99 mg/dL (ref 70–99)
Potassium: 4 mEq/L (ref 3.5–5.1)
Sodium: 140 mEq/L (ref 135–145)
Total Bilirubin: 0.6 mg/dL (ref 0.2–1.2)
Total Protein: 7.1 g/dL (ref 6.0–8.3)

## 2022-01-05 LAB — TSH: TSH: 1.64 u[IU]/mL (ref 0.35–5.50)

## 2022-01-05 LAB — LIPASE: Lipase: 11 U/L (ref 11.0–59.0)

## 2022-01-05 LAB — AMYLASE: Amylase: 24 U/L — ABNORMAL LOW (ref 27–131)

## 2022-01-05 MED ORDER — ZOLPIDEM TARTRATE 5 MG PO TABS: 5.0000 mg | ORAL_TABLET | Freq: Every evening | ORAL | 1 refills | Status: DC | PRN

## 2022-01-05 NOTE — Assessment & Plan Note (Signed)
Encouraged good sleep hygiene such as dark, quiet room. No blue/green glowing lights such as computer screens in bedroom. No alcohol or stimulants in evening. Cut down on caffeine as able. Regular exercise is helpful but not just prior to bed time. She has tried various options and been unsuccessful. Will try a short course of intermittent ambien 2.5 to 5 mg qhs.

## 2022-01-05 NOTE — Patient Instructions (Addendum)
GYIRSW or Saxenda shots for weight loss call insurance regarding coverage  Call gastroenterology for f/u appt, call if any concerns  Yerba Matte tea do not drink  Encouraged good sleep hygiene such as dark, quiet room. No blue/green glowing lights such as computer screens in bedroom. No alcohol or stimulants in evening. Cut down on caffeine as able. Regular exercise is helpful but not just prior to bed time.   Gallbladder Eating Plan High blood cholesterol, obesity, a sedentary lifestyle, an unhealthy diet, and diabetes are risk factors for developing gallstones. If you have a gallbladder condition, you may have trouble digesting fats and tolerating high fat intake. Eating a low-fat diet can help reduce your symptoms and may be helpful before and after having surgery to remove your gallbladder (cholecystectomy). Your health care provider may recommend that you work with a dietitian to help you reduce the amount of fat in your diet. What are tips for following this plan? General guidelines Limit your fat intake to less than 30% of your total daily calories. If you eat around 1,800 calories each day, this means eating less than 60 grams (g) of fat per day. Fat is an important part of a healthy diet. Eating a low-fat diet can make it hard to maintain a healthy body weight. Ask your dietitian how much fat, calories, and other nutrients you need each day. Eat small, frequent meals throughout the day instead of three large meals. Drink at least 8-10 cups (1.9-2.4 L) of fluid a day. Drink enough fluid to keep your urine pale yellow. If you drink alcohol: Limit how much you have to: 0-1 drink a day for women who are not pregnant. 0-2 drinks a day for men. Know how much alcohol is in a drink. In the [place] one drink equals one 12 oz bottle of beer (355 mL), one 5 oz glass of wine (148 mL), or one 1 oz glass of hard liquor (44 mL). Reading food labels  Check nutrition facts on food labels for the amount  of fat per serving. Choose foods with less than 3 grams of fat per serving. Shopping Choose nonfat and low-fat healthy foods. Look for the words "nonfat," "low-fat," or "fat-free." Avoid buying processed or prepackaged foods. Cooking Cook using low-fat methods, such as baking, broiling, grilling, or boiling. Cook with small amounts of healthy fats, such as olive oil, grapeseed oil, canola oil, avocado oil, or sunflower oil. What foods are recommended?  All fresh, frozen, or canned fruits and vegetables. Whole grains. Low-fat or nonfat (skim) milk and yogurt. Lean meat, skinless poultry, fish, eggs, and beans. Low-fat protein supplement powders or drinks. Spices and herbs. The items listed above may not be a complete list of foods and beverages you can eat and drink. Contact a dietitian for more information. What foods are not recommended? High-fat foods. These include baked goods, fast food, fatty cuts of meat, ice cream, french toast, sweet rolls, pizza, cheese bread, foods covered with butter, creamy sauces, or cheese. Fried foods. These include french fries, tempura, battered fish, breaded chicken, fried breads, and sweets. Foods that cause bloating and gas. The items listed above may not be a complete list of foods that you should avoid. Contact a dietitian for more information. Summary A low-fat diet can be helpful if you have a gallbladder condition, or before and after gallbladder surgery. Limit your fat intake to less than 30% of your total daily calories. This is about 60 g of fat if you eat 1,800  calories each day. Eat small, frequent meals throughout the day instead of three large meals. This information is not intended to replace advice given to you by your health care provider. Make sure you discuss any questions you have with your health care provider. Document Revised: 05/23/2021 Document Reviewed: 05/23/2021 Elsevier Patient Education  2023 [employer].

## 2022-01-05 NOTE — Assessment & Plan Note (Signed)
Found on MRI in April recommendation from radiology is to repeat imaging in 6 months will order.

## 2022-01-05 NOTE — Assessment & Plan Note (Signed)
Well controlled, no changes to meds. Encouraged heart healthy diet such as the DASH diet and exercise as tolerated.  °

## 2022-01-05 NOTE — Assessment & Plan Note (Addendum)
Still occurring but less frequently since April when she made some dietary changes, has cut down on fats and fast and processed, is cooking at home and eating better, she is encouraged to minimize fats and to let us know if her symptoms worsen so we can consider referral to general surgery for consultation

## 2022-01-07 ENCOUNTER — Other Ambulatory Visit: Payer: Self-pay

## 2022-01-07 DIAGNOSIS — R748 Abnormal levels of other serum enzymes: Secondary | ICD-10-CM

## 2022-01-07 LAB — DRUG MONITORING PANEL 376104, URINE
Amphetamines: NEGATIVE ng/mL (ref <500)
Barbiturates: NEGATIVE ng/mL (ref <300)
Benzodiazepines: NEGATIVE ng/mL (ref <100)
Cocaine Metabolite: NEGATIVE ng/mL (ref <150)
Desmethyltramadol: NEGATIVE ng/mL (ref <100)
Opiates: NEGATIVE ng/mL (ref <100)
Oxycodone: NEGATIVE ng/mL (ref <100)
Tramadol: NEGATIVE ng/mL (ref <100)

## 2022-01-07 LAB — DM TEMPLATE

## 2022-01-08 ENCOUNTER — Telehealth (HOSPITAL_BASED_OUTPATIENT_CLINIC_OR_DEPARTMENT_OTHER): Payer: Self-pay

## 2022-01-10 ENCOUNTER — Telehealth (HOSPITAL_BASED_OUTPATIENT_CLINIC_OR_DEPARTMENT_OTHER): Payer: Self-pay

## 2022-01-12 ENCOUNTER — Telehealth (HOSPITAL_BASED_OUTPATIENT_CLINIC_OR_DEPARTMENT_OTHER): Payer: Self-pay

## 2022-01-21 ENCOUNTER — Other Ambulatory Visit (INDEPENDENT_AMBULATORY_CARE_PROVIDER_SITE_OTHER): Payer: 59

## 2022-01-21 DIAGNOSIS — R748 Abnormal levels of other serum enzymes: Secondary | ICD-10-CM | POA: Diagnosis not present

## 2022-01-21 LAB — COMPREHENSIVE METABOLIC PANEL
ALT: 34 U/L (ref 0–35)
AST: 28 U/L (ref 0–37)
Albumin: 4.3 g/dL (ref 3.5–5.2)
Alkaline Phosphatase: 157 U/L — ABNORMAL HIGH (ref 39–117)
BUN: 16 mg/dL (ref 6–23)
CO2: 29 mEq/L (ref 19–32)
Calcium: 9.4 mg/dL (ref 8.4–10.5)
Chloride: 104 mEq/L (ref 96–112)
Creatinine, Ser: 0.78 mg/dL (ref 0.40–1.20)
GFR: 102.07 mL/min (ref >60.00)
Glucose, Bld: 98 mg/dL (ref 70–99)
Potassium: 4.1 mEq/L (ref 3.5–5.1)
Sodium: 140 mEq/L (ref 135–145)
Total Bilirubin: 0.5 mg/dL (ref 0.2–1.2)
Total Protein: 7.2 g/dL (ref 6.0–8.3)

## 2022-01-21 NOTE — Addendum Note (Signed)
Addended by: Mervin Kung A on: 01/21/2022 10:20 AM   Modules accepted: Orders

## 2022-01-24 LAB — ALKALINE PHOSPHATASE, ISOENZYMES
Alkaline Phosphatase: 172 IU/L — ABNORMAL HIGH (ref 44–121)
BONE FRACTION: 20 % (ref 14–68)
INTESTINAL FRAC.: 5 % (ref 0–18)
LIVER FRACTION: 75 % (ref 18–85)

## 2022-01-27 ENCOUNTER — Telehealth: Payer: Self-pay | Admitting: *Deleted

## 2022-01-27 DIAGNOSIS — R748 Abnormal levels of other serum enzymes: Secondary | ICD-10-CM

## 2022-01-27 NOTE — Telephone Encounter (Signed)
Pt has called back and she has seen the results via mychart and appointment for repeat labs have been scheduled in a month. Lab orders placed for future.

## 2022-01-27 NOTE — Telephone Encounter (Signed)
Attempted to reach pt by phone regarding 8/6 lab result note.  Voicemail full, sent mychart message to call office for result and follow up lab appt.

## 2022-02-24 ENCOUNTER — Telehealth: Payer: Self-pay | Admitting: Family Medicine

## 2022-02-24 ENCOUNTER — Other Ambulatory Visit: Payer: Self-pay | Admitting: Family Medicine

## 2022-02-24 DIAGNOSIS — N760 Acute vaginitis: Secondary | ICD-10-CM

## 2022-02-24 DIAGNOSIS — Z7251 High risk heterosexual behavior: Secondary | ICD-10-CM

## 2022-02-24 NOTE — Telephone Encounter (Signed)
Pt has a lab appt tomorrow and would like to add on std testing.

## 2022-02-25 ENCOUNTER — Other Ambulatory Visit (INDEPENDENT_AMBULATORY_CARE_PROVIDER_SITE_OTHER): Payer: Commercial Managed Care - HMO

## 2022-02-25 DIAGNOSIS — N760 Acute vaginitis: Secondary | ICD-10-CM

## 2022-02-25 DIAGNOSIS — Z7251 High risk heterosexual behavior: Secondary | ICD-10-CM

## 2022-02-25 DIAGNOSIS — R748 Abnormal levels of other serum enzymes: Secondary | ICD-10-CM

## 2022-02-25 NOTE — Telephone Encounter (Signed)
Called pt was advised she stated she ok don't  Need other test at this time

## 2022-02-26 LAB — HEPATITIS PANEL, ACUTE
Hep A IgM: NONREACTIVE
Hep B C IgM: NONREACTIVE
Hepatitis B Surface Ag: NONREACTIVE
Hepatitis C Ab: NONREACTIVE

## 2022-02-26 LAB — RPR: RPR Ser Ql: NONREACTIVE

## 2022-02-26 LAB — HIV ANTIBODY (ROUTINE TESTING W REFLEX): HIV 1&2 Ab, 4th Generation: NONREACTIVE

## 2022-02-28 LAB — ALKALINE PHOSPHATASE, ISOENZYMES
Alkaline Phosphatase: 164 IU/L — ABNORMAL HIGH (ref 44–121)
BONE FRACTION: 23 % (ref 14–68)
INTESTINAL FRAC.: 9 % (ref 0–18)
LIVER FRACTION: 68 % (ref 18–85)

## 2022-02-28 LAB — SPECIMEN STATUS REPORT

## 2022-03-19 ENCOUNTER — Ambulatory Visit: Payer: Commercial Managed Care - HMO | Admitting: Gastroenterology

## 2022-03-19 ENCOUNTER — Encounter: Payer: Self-pay | Admitting: Gastroenterology

## 2022-03-19 VITALS — BP 132/84 | HR 91 | Ht 66.0 in | Wt 208.1 lb

## 2022-03-19 DIAGNOSIS — R197 Diarrhea, unspecified: Secondary | ICD-10-CM | POA: Diagnosis not present

## 2022-03-19 DIAGNOSIS — R1011 Right upper quadrant pain: Secondary | ICD-10-CM

## 2022-03-19 DIAGNOSIS — K769 Liver disease, unspecified: Secondary | ICD-10-CM | POA: Diagnosis not present

## 2022-03-19 NOTE — Patient Instructions (Signed)
Try the lactose free diet for several weeks.   _______________________________________________________  If you are age 30 or older, your body mass index should be between 23-30. Your Body mass index is 33.59 kg/m. If this is out of the aforementioned range listed, please consider follow up with your Primary Care Provider.  If you are age 86 or younger, your body mass index should be between 19-25. Your Body mass index is 33.59 kg/m. If this is out of the aformentioned range listed, please consider follow up with your Primary Care Provider.   ________________________________________________________  The [place] [facility] providers would like to encourage you to use [facility] to communicate with providers for non-urgent requests or questions.  Due to [place] hold times on the telephone, sending your provider a message by [facility] may be a faster and more efficient way to get a response.  Please allow 48 business hours for a response.  Please remember that this is for non-urgent requests.  _______________________________________________________   I appreciate the opportunity to care for you. Alonza Bogus, PA-C

## 2022-03-19 NOTE — Progress Notes (Signed)
03/19/2022 [place] S Nickerson [id number] 07-Oct-1990   HISTORY OF PRESENT ILLNESS: This is a 31 year old female who is a patient of Dr. Steve Rattler.  She was seen here by another one of our PAs in March for evaluation of right upper quadrant abdominal pain and GERD type symptoms.  During her evaluation she had an ultrasound performed that showed some gallstones and a couple of liver lesions.  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  Recommended that she have a repeat MRI in 6 months to evaluate for any growth.  That has actually already been scheduled by her PCP for next week.  She says that the right upper quadrant abdominal pain is less frequent and less severe.  She only tried the Levsin on 1 occasion.  In regards to the GERD, she never took the Prilosec/PPI that was recommended.  Basic labs and H. pylori were negative.  Today she also reports intermittent diarrhea, usually 2 random episodes a week.  She says it usually starts with normal stools and then she will end up with an episode of diarrhea.  Denies any rectal bleeding.   Past Medical History:  Diagnosis Date   Anxiety 02/17/2011   Constipation    Depression with anxiety 03/03/2011   GERD (gastroesophageal reflux disease)    Herpes simplex without mention of complication    Insomnia 09/01/2011   Overweight 04/21/2015   Reflux 02/17/2011   Reflux 03/03/2011   Reflux 09/16/2011   Tobacco use 12/25/2016   Past Surgical History:  Procedure Laterality Date   TONSILLECTOMY AND ADENOIDECTOMY     age 61   WISDOM TOOTH EXTRACTION      reports that she quit smoking about 15 months ago. Her smoking use included cigarettes. She has never used smokeless tobacco. She reports current alcohol use. She reports that she does not currently use drugs after having used the following drugs: Marijuana. family history includes COPD in her paternal grandfather; Cancer (age of onset: 22) in her maternal  grandmother and paternal grandfather; Diabetes in her maternal grandmother, mother, and paternal grandmother; Emphysema in her paternal grandfather; Hypertension in her maternal grandfather, maternal grandmother, mother, and paternal grandmother; Kidney disease in her maternal grandfather; Kidney failure in her maternal grandfather. Allergies  Allergen Reactions   Other Other (See Comments)    HOUSE DUST, sneezing and runny nose      Outpatient Encounter Medications as of 03/19/2022  Medication Sig   hyoscyamine (LEVSIN) 0.125 MG tablet Take 1 tablet (0.125 mg total) by mouth every 6 (six) hours as needed for cramping (AB pain).   LORazepam (ATIVAN) 0.5 MG tablet Take 1 tablet (0.5 mg total) by mouth 2 (two) times daily as needed. for anxiety   Multiple Vitamin (MULTIVITAMIN) tablet Take 1 tablet by mouth daily as needed.   zolpidem (AMBIEN) 5 MG tablet Take 1 tablet (5 mg total) by mouth at bedtime as needed for sleep.   metoprolol tartrate (LOPRESSOR) 25 MG tablet Take 1 tablet (25 mg total) by mouth 2 (two) times daily. (Patient not taking: Reported on 01/05/2022)   [DISCONTINUED] omeprazole (PRILOSEC) 20 MG capsule Take 1 capsule (20 mg total) by mouth daily for 14 days. Take daily before meals.   No facility-administered encounter medications on file as of 03/19/2022.     REVIEW OF SYSTEMS  : All other systems reviewed and negative except where noted in the History of Present Illness.  PHYSICAL EXAM: BP 132/84   Pulse 91   Ht 5\' 6"  (1.676 m)   Wt 208 lb 2 oz (94.4 kg)   BMI 33.59 kg/m  General: Well developed female in no acute distress Head: Normocephalic and atraumatic Eyes:  Sclerae anicteric, conjunctiva pink. Ears: Normal auditory acuity Lungs: Clear throughout to auscultation; no W/R/R. Heart: Regular rate and rhythm; no M/R/G. Abdomen: Soft, non-distended.  BS present.  Non-tender. Musculoskeletal: Symmetrical with no gross deformities  Skin: No lesions on visible  extremities Extremities: No edema  Neurological: Alert oriented x 4, grossly non-focal Psychological:  Alert and cooperative. 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  Was due for 43-month follow-up in October.  That is scheduled for next week and has been ordered by her PCP.  We will keep an eye out for that.  She has been off of her OCPs for about 3 to 4 months. *Right upper quadrant abdominal pain: Has been less in frequency and severity.  Not likely related to liver lesions especially if they had not changed in size.  She does have gallstones as well, so question symptomatic cholelithiasis.  Can consider referral to surgeon. *Diarrhea: Usually just a couple of episodes per week.  Suspect it may be dietary.  We discussed lactose-free diet and she was given literature on this.  Will try this for a few weeks to see if it helps.  CC:  November, MD

## 2022-03-20 ENCOUNTER — Other Ambulatory Visit: Payer: Self-pay

## 2022-03-20 ENCOUNTER — Telehealth: Payer: Self-pay

## 2022-03-20 DIAGNOSIS — R197 Diarrhea, unspecified: Secondary | ICD-10-CM

## 2022-03-20 DIAGNOSIS — R109 Unspecified abdominal pain: Secondary | ICD-10-CM

## 2022-03-20 DIAGNOSIS — K769 Liver disease, unspecified: Secondary | ICD-10-CM

## 2022-03-20 NOTE — Progress Notes (Signed)
Agree with assessment/plan.  Also add on AFP, AMA, GGT (elevated alk phos) Rpt LFTs at FU Pl make sure she has FU appt (and doesn't fall thru the cracks)  Carmell Austria, MD [facility] [phone]  Remo Lipps, Pl order labs And FU RG

## 2022-03-20 NOTE — Telephone Encounter (Signed)
Orders for labs placed in Epic Unable to leave message: Voice Mail Box is full

## 2022-03-20 NOTE — Telephone Encounter (Signed)
-----   Message from Jackquline Denmark, MD sent at 03/20/2022 10:01 AM EDT -----    ----- Message ----- From: Loralie Champagne, PA-C Sent: 03/19/2022   1:34 PM EDT To: Jackquline Denmark, MD

## 2022-03-23 NOTE — Telephone Encounter (Signed)
Pt was made aware of Dr. Lyndel Safe recommendations:  Pt made aware that orders for labs has been placed into they system and she can come by and have those drawn: Location to lab provided: Office visit scheduled with Dr. Lyndel Safe on 05/26/2022 at 4:00 PM.    Pt made aware: Pt verbalized understanding with all questions answered.

## 2022-03-25 ENCOUNTER — Telehealth: Payer: Self-pay | Admitting: Family Medicine

## 2022-03-25 ENCOUNTER — Telehealth (HOSPITAL_BASED_OUTPATIENT_CLINIC_OR_DEPARTMENT_OTHER): Payer: Self-pay

## 2022-03-25 NOTE — Telephone Encounter (Signed)
[facility]) called stating that the referral for pt's MRI needs authorization through her insurance to be seen at their practice.

## 2022-03-26 ENCOUNTER — Telehealth: Payer: Self-pay

## 2022-03-26 NOTE — Telephone Encounter (Signed)
Patient is currently scheduled for 03/28/22 for MRI/MRCP ordered by PCP.

## 2022-03-26 NOTE — Telephone Encounter (Signed)
-----   Message from Carl Best, RN sent at 09/24/2021 10:51 AM EDT ----- Patient needs follow up MRI. Refer to result note 4/5.

## 2022-03-28 ENCOUNTER — Ambulatory Visit (HOSPITAL_BASED_OUTPATIENT_CLINIC_OR_DEPARTMENT_OTHER): Payer: Commercial Managed Care - HMO

## 2022-04-01 ENCOUNTER — Telehealth: Payer: Self-pay | Admitting: Family Medicine

## 2022-04-01 NOTE — Telephone Encounter (Signed)
Insurance rep called requesting a reroute of the MRI for this pt to the following facility:  [address] [place], [place] [address] P: [phone] F: [phone]

## 2022-04-02 ENCOUNTER — Other Ambulatory Visit: Payer: Self-pay

## 2022-04-02 DIAGNOSIS — K769 Liver disease, unspecified: Secondary | ICD-10-CM

## 2022-04-02 NOTE — Telephone Encounter (Signed)
Order was changed.

## 2022-04-04 ENCOUNTER — Ambulatory Visit (HOSPITAL_BASED_OUTPATIENT_CLINIC_OR_DEPARTMENT_OTHER): Payer: Commercial Managed Care - HMO

## 2022-04-04 ENCOUNTER — Encounter (HOSPITAL_BASED_OUTPATIENT_CLINIC_OR_DEPARTMENT_OTHER): Payer: Self-pay

## 2022-04-13 ENCOUNTER — Ambulatory Visit: Payer: 59 | Admitting: Family Medicine

## 2022-04-21 ENCOUNTER — Ambulatory Visit
Admission: RE | Admit: 2022-04-21 | Discharge: 2022-04-21 | Disposition: A | Discharge status: Home / Self Care | Payer: Commercial Managed Care - HMO | Source: Ambulatory Visit | Attending: Family Medicine | Admitting: Family Medicine

## 2022-04-21 DIAGNOSIS — K769 Liver disease, unspecified: Secondary | ICD-10-CM

## 2022-04-21 MED ORDER — GADOPICLENOL 0.5 MMOL/ML IV SOLN
9.0000 mL | Freq: Once | INTRAVENOUS | Status: AC | PRN
  Administered 2022-04-21: 9 mL via INTRAVENOUS

## 2022-05-26 ENCOUNTER — Other Ambulatory Visit (INDEPENDENT_AMBULATORY_CARE_PROVIDER_SITE_OTHER): Payer: Commercial Managed Care - HMO

## 2022-05-26 ENCOUNTER — Encounter: Payer: Self-pay | Admitting: Gastroenterology

## 2022-05-26 ENCOUNTER — Ambulatory Visit (INDEPENDENT_AMBULATORY_CARE_PROVIDER_SITE_OTHER): Payer: Commercial Managed Care - HMO | Admitting: Gastroenterology

## 2022-05-26 VITALS — BP 120/82 | HR 74 | Ht 66.0 in | Wt 213.6 lb

## 2022-05-26 DIAGNOSIS — R197 Diarrhea, unspecified: Secondary | ICD-10-CM

## 2022-05-26 DIAGNOSIS — K769 Liver disease, unspecified: Secondary | ICD-10-CM

## 2022-05-26 DIAGNOSIS — R109 Unspecified abdominal pain: Secondary | ICD-10-CM

## 2022-05-26 LAB — GAMMA GT: GGT: 66 U/L — ABNORMAL HIGH (ref 7–51)

## 2022-05-26 LAB — COMPREHENSIVE METABOLIC PANEL
ALT: 34 U/L (ref 0–35)
AST: 25 U/L (ref 0–37)
Albumin: 4.2 g/dL (ref 3.5–5.2)
Alkaline Phosphatase: 123 U/L — ABNORMAL HIGH (ref 39–117)
BUN: 11 mg/dL (ref 6–23)
CO2: 27 mEq/L (ref 19–32)
Calcium: 8.8 mg/dL (ref 8.4–10.5)
Chloride: 104 mEq/L (ref 96–112)
Creatinine, Ser: 0.68 mg/dL (ref 0.40–1.20)
GFR: 116.76 mL/min (ref >60.00)
Glucose, Bld: 80 mg/dL (ref 70–99)
Potassium: 3.7 mEq/L (ref 3.5–5.1)
Sodium: 139 mEq/L (ref 135–145)
Total Bilirubin: 0.3 mg/dL (ref 0.2–1.2)
Total Protein: 7.2 g/dL (ref 6.0–8.3)

## 2022-05-26 NOTE — Patient Instructions (Signed)
_______________________________________________________  If you are age 30 or older, your body mass index should be between 23-30. Your Body mass index is 34.48 kg/m. If this is out of the aforementioned range listed, please consider follow up with your Primary Care Provider.  If you are age 101 or younger, your body mass index should be between 19-25. Your Body mass index is 34.48 kg/m. If this is out of the aformentioned range listed, please consider follow up with your Primary Care Provider.   ________________________________________________________  The [place] [facility] providers would like to encourage you to use [facility] to communicate with providers for non-urgent requests or questions.  Due to [place] hold times on the telephone, sending your provider a message by [facility] may be a faster and more efficient way to get a response.  Please allow 48 business hours for a response.  Please remember that this is for non-urgent requests.  _______________________________________________________  Please follow up in 6 months. Give [place] a call at [phone] to schedule an appointment.  Thank you,  Dr. Lynann Bologna

## 2022-05-26 NOTE — Addendum Note (Signed)
Addended by: Alberteen Sam E on: 05/26/2022 04:26 PM   Modules accepted: Orders

## 2022-05-26 NOTE — Progress Notes (Signed)
Chief Complaint: FU  Referring Provider:  Mosie Lukes, MD      ASSESSMENT AND PLAN;   #1. Liver lesions- likely hepatic adenomas.  Decreased in size after stopping OCs  #2. Abn Alk Phos- WU in progress. Alk iso with Nl Liver/bone/intestinal fraction  #3. Asymptomatic cholelithiasis.  Plan: -Add CMP (add to blood drawn for GGT, AMA today) -Encouraged her to lose weight -Minimize alcohol. -Avoid pregnancy -Follow-up in 6 months.  At follow-up, repeat LFTs and possibly MRI with contrast.   HPI:    Beverly Pearson is a 31 y.o. female  Stopped OCs Liver lesions have decreased in size on MRI 04/22/2022.  Radiology feels it is consistent with benign hepatic adenomas rather than FNH.  No nausea, vomiting, heartburn, regurgitation, odynophagia or dysphagia.  No significant diarrhea or constipation.  No melena or hematochezia. No unintentional weight loss. No abdominal pain.  No H/O itching, skin lesions, easy bruisability, intake of OTC meds including diet pills, herbal medications, anabolic steroids or Tylenol. There is no H/O blood transfusions, IVDA or FH of liver disease. No jaundice, dark urine or pale stools.   No alcohol abuse but does drink socially.     Latest Ref Rng & Units 02/25/2022   12:00 AM 01/21/2022   10:06 AM 01/21/2022    9:59 AM  Hepatic Function  Total Protein 6.0 - 8.3 g/dL   7.2   Albumin 3.5 - 5.2 g/dL   4.3   AST 0 - 37 U/L   28   ALT 0 - 35 U/L   34   Alk Phosphatase 44 - 121 IU/L 164  172  157   Total Bilirubin 0.2 - 1.2 mg/dL   0.5       Previous GI work-up: MRI 04/22/2022 IMPRESSION: 1. Interval decrease in size of the bilobar hepatic lesions (3.4 cm, 2 cm) which again demonstrate nonspecific but almost certainly benign imaging characteristics. However, taken in context with the interval stoppage of OCPs, per medical record for the last 3-4 months, the imaging findings with interval decrease in size since cessation of OCPs is highly  suggestive of benign hepatic adenomas over [place]. No new lesions identified. 2. Cholelithiasis without cholecystitis.  MRI 09/22/2021 1. Bilobar arterially enhancing hepatic masses(4.2 cm, 2.5 cm) most likely reflect hepatic adenomas or focal nodular hyperplasia. Consider cessation of oral contraceptives, patient is currently on this medication, and follow-up MRI in 6 months with and without EOVIST contrast to ensure stability and for more definitive characterization. 2. Cholelithiasis without findings of acute cholecystitis.  [place] 09/08/2021 1.  Cholelithiasis without evidence of acute cholecystitis. 2. Possible mass in the liver measuring up to 6.2 cm. Recommend contrast enhanced liver MRI for further evaluation. Past Medical History:  Diagnosis Date   Anxiety 02/17/2011   Constipation    Depression with anxiety 03/03/2011   GERD (gastroesophageal reflux disease)    Herpes simplex without mention of complication    Insomnia 09/01/2011   Overweight 04/21/2015   Reflux 02/17/2011   Reflux 03/03/2011   Reflux 09/16/2011   Tobacco use 12/25/2016    Past Surgical History:  Procedure Laterality Date   TONSILLECTOMY AND ADENOIDECTOMY     age 27   WISDOM 24 EXTRACTION      Family History  Problem Relation Age of Onset   Diabetes Mother        type 2   Hypertension Mother    Diabetes Maternal Grandmother        type 2  Cancer Maternal Grandmother 65       ovarian   Hypertension Maternal Grandmother    Kidney failure Maternal Grandfather    Hypertension Maternal Grandfather    Kidney disease Maternal Grandfather    Diabetes Paternal Grandmother        type 2   Hypertension Paternal Grandmother    Emphysema Paternal Grandfather        smoke   COPD Paternal Grandfather    Cancer Paternal Grandfather 108       prostate cancer   Colon cancer Neg Hx     Social History   Tobacco Use   Smoking status: Former    Types: Cigarettes    Quit date: 12/04/2020    Years since  quitting: 1.4   Smokeless tobacco: Never  Vaping Use   Vaping Use: Never used  Substance Use Topics   Alcohol use: Yes    Comment: socially   Drug use: Not Currently    Types: Marijuana    Current Outpatient Medications  Medication Sig Dispense Refill   LORazepam (ATIVAN) 0.5 MG tablet Take 1 tablet (0.5 mg total) by mouth 2 (two) times daily as needed. for anxiety 30 tablet 1   metoprolol tartrate (LOPRESSOR) 25 MG tablet Take 1 tablet (25 mg total) by mouth 2 (two) times daily. 60 tablet 5   Multiple Vitamin (MULTIVITAMIN) tablet Take 1 tablet by mouth daily as needed.     zolpidem (AMBIEN) 5 MG tablet Take 1 tablet (5 mg total) by mouth at bedtime as needed for sleep. 30 tablet 1   hyoscyamine (LEVSIN) 0.125 MG tablet Take 1 tablet (0.125 mg total) by mouth every 6 (six) hours as needed for cramping (AB pain). (Patient not taking: Reported on 05/26/2022) 20 tablet 0   No current facility-administered medications for this visit.    Allergies  Allergen Reactions   Other Other (See Comments)    HOUSE DUST, sneezing and runny nose    Review of Systems:  neg     Physical Exam:    BP 120/82   Pulse 74   Ht _0  (1.676 m)   Wt 213 lb 9.6 oz (96.9 kg)   BMI 34.48 kg/m  Wt Readings from Last 3 Encounters:  05/26/22 213 lb 9.6 oz (96.9 kg)  03/19/22 208 lb 2 oz (94.4 kg)  01/05/22 201 lb (91.2 kg)   Constitutional:  Well-developed, in no acute distress. Psychiatric: Normal mood and affect. Behavior is normal. HEENT: Pupils normal.  Conjunctivae are normal. No scleral icterus.  Cardiovascular: Normal rate, regular rhythm. No edema Pulmonary/chest: Effort normal and breath sounds normal. No wheezing, rales or rhonchi. Abdominal: Soft, nondistended. Nontender. Bowel sounds active throughout. There are no masses palpable. No hepatomegaly. Rectal: Deferred Neurological: Alert and oriented to person place and time. Skin: Skin is warm and dry. No rashes noted.  Data  Reviewed: I have personally reviewed following labs and imaging studies  CBC:    Latest Ref Rng & Units 01/05/2022   12:32 PM 09/01/2021   12:20 PM 07/08/2021   10:01 AM  CBC  WBC 4.0 - 10.5 K/uL 7.7  7.4  7.5   Hemoglobin 12.0 - 15.0 g/dL 14.1  14.2  14.2   Hematocrit 36.0 - 46.0 % 42.8  43.2  44.3   Platelets 150.0 - 400.0 K/uL 301.0  351.0  397.0     CMP:    Latest Ref Rng & Units 02/25/2022   12:00 AM 01/21/2022   10:06 AM 01/21/2022  9:59 AM  CMP  Glucose 70 - 99 mg/dL   98   BUN 6 - 23 mg/dL   16   Creatinine 0.40 - 1.20 mg/dL   0.78   Sodium 135 - 145 mEq/L   140   Potassium 3.5 - 5.1 mEq/L   4.1   Chloride 96 - 112 mEq/L   104   CO2 19 - 32 mEq/L   29   Calcium 8.4 - 10.5 mg/dL   9.4   Total Protein 6.0 - 8.3 g/dL   7.2   Total Bilirubin 0.2 - 1.2 mg/dL   0.5   Alkaline Phos 44 - 121 IU/L 164  172  157   AST 0 - 37 U/L   28   ALT 0 - 35 U/L   34        Carmell Austria, MD 05/26/2022, 4:00 PM  Cc: Mosie Lukes, MD

## 2022-05-29 LAB — AFP TUMOR MARKER: AFP-Tumor Marker: 1.7 ng/mL

## 2022-05-29 LAB — MITOCHONDRIAL ANTIBODIES: Mitochondrial M2 Ab, IgG: 23.6 U — ABNORMAL HIGH (ref <=20.0)

## 2022-06-04 ENCOUNTER — Ambulatory Visit (INDEPENDENT_AMBULATORY_CARE_PROVIDER_SITE_OTHER): Payer: Commercial Managed Care - HMO | Admitting: Family Medicine

## 2022-06-04 ENCOUNTER — Encounter: Payer: Self-pay | Admitting: Family Medicine

## 2022-06-04 ENCOUNTER — Ambulatory Visit: Payer: 59 | Admitting: Family Medicine

## 2022-06-04 ENCOUNTER — Ambulatory Visit (HOSPITAL_BASED_OUTPATIENT_CLINIC_OR_DEPARTMENT_OTHER)
Admission: RE | Admit: 2022-06-04 | Discharge: 2022-06-04 | Disposition: A | Discharge status: Home / Self Care | Payer: Commercial Managed Care - HMO | Source: Ambulatory Visit | Attending: Family Medicine | Admitting: Family Medicine

## 2022-06-04 VITALS — BP 134/89 | HR 108 | Temp 98.0°F | Resp 16 | Ht 66.0 in | Wt 213.0 lb

## 2022-06-04 DIAGNOSIS — M542 Cervicalgia: Secondary | ICD-10-CM

## 2022-06-04 DIAGNOSIS — R070 Pain in throat: Secondary | ICD-10-CM | POA: Insufficient documentation

## 2022-06-04 DIAGNOSIS — F418 Other specified anxiety disorders: Secondary | ICD-10-CM

## 2022-06-04 DIAGNOSIS — I10 Essential (primary) hypertension: Secondary | ICD-10-CM

## 2022-06-04 DIAGNOSIS — E010 Iodine-deficiency related diffuse (endemic) goiter: Secondary | ICD-10-CM
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
## 2022-06-05 LAB — TSH: TSH: 2.82 u[IU]/mL (ref 0.35–5.50)

## 2022-06-05 LAB — CBC
HCT: 43.1 % (ref 36.0–46.0)
Hemoglobin: 14.2 g/dL (ref 12.0–15.0)
MCHC: 32.9 g/dL (ref 30.0–36.0)
MCV: 85.5 fl (ref 78.0–100.0)
Platelets: 426 10*3/uL — ABNORMAL HIGH (ref 150.0–400.0)
RBC: 5.04 Mil/uL (ref 3.87–5.11)
RDW: 14 % (ref 11.5–15.5)
WBC: 8.9 10*3/uL (ref 4.0–10.5)

## 2022-06-05 LAB — COMPREHENSIVE METABOLIC PANEL
ALT: 37 U/L — ABNORMAL HIGH (ref 0–35)
AST: 28 U/L (ref 0–37)
Albumin: 4.5 g/dL (ref 3.5–5.2)
Alkaline Phosphatase: 136 U/L — ABNORMAL HIGH (ref 39–117)
BUN: 12 mg/dL (ref 6–23)
CO2: 27 mEq/L (ref 19–32)
Calcium: 9.6 mg/dL (ref 8.4–10.5)
Chloride: 100 mEq/L (ref 96–112)
Creatinine, Ser: 0.74 mg/dL (ref 0.40–1.20)
GFR: 108.44 mL/min (ref >60.00)
Glucose, Bld: 108 mg/dL — ABNORMAL HIGH (ref 70–99)
Potassium: 3.7 mEq/L (ref 3.5–5.1)
Sodium: 135 mEq/L (ref 135–145)
Total Bilirubin: 0.4 mg/dL (ref 0.2–1.2)
Total Protein: 7.4 g/dL (ref 6.0–8.3)

## 2022-06-05 LAB — LIPID PANEL
Cholesterol: 171 mg/dL (ref 0–200)
HDL: 91.6 mg/dL (ref >39.00)
LDL Cholesterol: 61 mg/dL (ref 0–99)
NonHDL: 79.27
Total CHOL/HDL Ratio: 2
Triglycerides: 91 mg/dL (ref 0.0–149.0)
VLDL: 18.2 mg/dL (ref 0.0–40.0)

## 2022-06-10 NOTE — Telephone Encounter (Signed)
Patient scheduled 08-14-2022 at 10am with [facility]

## 2022-06-11 ENCOUNTER — Other Ambulatory Visit: Payer: Self-pay | Admitting: Family Medicine

## 2022-08-14 DIAGNOSIS — K8020 Calculus of gallbladder without cholecystitis without obstruction: Secondary | ICD-10-CM | POA: Insufficient documentation

## 2022-08-14 DIAGNOSIS — R748 Abnormal levels of other serum enzymes: Secondary | ICD-10-CM | POA: Insufficient documentation

## 2022-08-14 DIAGNOSIS — D134 Benign neoplasm of liver: Secondary | ICD-10-CM | POA: Insufficient documentation

## 2022-08-18 ENCOUNTER — Encounter: Payer: Self-pay | Admitting: Internal Medicine

## 2022-08-24 ENCOUNTER — Encounter: Payer: Self-pay | Admitting: Gastroenterology

## 2022-09-04 ENCOUNTER — Other Ambulatory Visit: Payer: Self-pay | Admitting: Family Medicine

## 2022-09-10 ENCOUNTER — Other Ambulatory Visit: Payer: Self-pay | Admitting: Family Medicine

## 2022-09-10 NOTE — Telephone Encounter (Signed)
Requesting: lorazepam 0.5mg   Contract: 01/05/22 UDS: 01/05/22 Last Visit: 06/04/22 w/ Lovena Le Next Visit: None Last Refill:  07/08/21 #30 and 1RF   Please Advise

## 2022-09-30 DIAGNOSIS — R52 Pain, unspecified: Secondary | ICD-10-CM | POA: Insufficient documentation

## 2022-09-30 DIAGNOSIS — Z209 Contact with and (suspected) exposure to unspecified communicable disease: Secondary | ICD-10-CM | POA: Insufficient documentation

## 2022-09-30 DIAGNOSIS — Z7251 High risk heterosexual behavior: Secondary | ICD-10-CM | POA: Insufficient documentation

## 2022-09-30 NOTE — Assessment & Plan Note (Signed)
Well controlled, no changes to meds. Encouraged heart healthy diet such as the DASH diet and exercise as tolerated.  °

## 2022-09-30 NOTE — Assessment & Plan Note (Signed)
Patient requests STD testing today

## 2022-09-30 NOTE — Assessment & Plan Note (Signed)
Patient reports pain

## 2022-09-30 NOTE — Assessment & Plan Note (Signed)
Encouraged complete cessation. Discussed need to quit as relates to risk of numerous cancers, cardiac and pulmonary disease as well as neurologic complications. Counseled for greater than 3 minutes she only uses tobacco infrequently, once every couple of months at most.

## 2022-10-01 ENCOUNTER — Other Ambulatory Visit (HOSPITAL_COMMUNITY)
Admission: RE | Admit: 2022-10-01 | Discharge: 2022-10-01 | Disposition: A | Discharge status: Home / Self Care | Payer: Medicaid Other | Source: Ambulatory Visit | Attending: Family Medicine | Admitting: Family Medicine

## 2022-10-01 ENCOUNTER — Ambulatory Visit (INDEPENDENT_AMBULATORY_CARE_PROVIDER_SITE_OTHER): Payer: Medicaid Other | Admitting: Family Medicine

## 2022-10-01 VITALS — BP 136/89 | HR 80 | Temp 97.5°F | Resp 16 | Ht 66.0 in | Wt 196.2 lb

## 2022-10-01 DIAGNOSIS — Z720 Tobacco use: Secondary | ICD-10-CM

## 2022-10-01 DIAGNOSIS — K769 Liver disease, unspecified: Secondary | ICD-10-CM | POA: Diagnosis not present

## 2022-10-01 DIAGNOSIS — Z209 Contact with and (suspected) exposure to unspecified communicable disease: Secondary | ICD-10-CM | POA: Diagnosis present

## 2022-10-01 DIAGNOSIS — I10 Essential (primary) hypertension: Secondary | ICD-10-CM

## 2022-10-01 DIAGNOSIS — Z7251 High risk heterosexual behavior: Secondary | ICD-10-CM | POA: Insufficient documentation

## 2022-10-01 DIAGNOSIS — R52 Pain, unspecified: Secondary | ICD-10-CM

## 2022-10-01 DIAGNOSIS — R109 Unspecified abdominal pain: Secondary | ICD-10-CM | POA: Insufficient documentation

## 2022-10-01 NOTE — Assessment & Plan Note (Signed)
Intermittent abd pain, diarrhea and constipation. No bloody or tarry stool she is working up possiblity of endometriois with gyn and awaits an [place]. She should start fiber supplement bid and probiotic eat a high fiber diet, exercise and hydrate well

## 2022-10-01 NOTE — Patient Instructions (Addendum)
Take a Multivitamin and fatty acid supplement such as fish oil caps and 2000 IU of Vitamin D daily  Benefiber or Metamucil twice daily and a high quality probiotic

## 2022-10-01 NOTE — Assessment & Plan Note (Signed)
Has been referred to hepatology at [facility] and is undergoing work up now. Drema Pry, NP

## 2022-10-01 NOTE — Progress Notes (Signed)
Subjective:    Patient ID: Beverly Pearson, female    DOB: July 07, 1990, 32 y.o.   MRN: [id number]  Chief Complaint  Patient presents with   nerve pain    Here for nerve pain and STD test    HPI Patient is in today for follow up on chronic medical concerns. No recent febrile illness or hospitalizations. Denies CP/palp/SOB/HA/congestion/fevers or GU c/o. Taking meds as prescribed. She is worried about some unprotected intercourse in past months and would like some STD testing but denies any discharge or vaginal lesions has some trouble with ongoing intermittent abd pain but that is not new intermittent constipation and diarrhea also occur. She is working on work up of liver lesion with hepatology with [facility] and has blood work pending. She notes some paresthesias in her extremities at times. Had a URI in October but not recurrent   Past Medical History:  Diagnosis Date   Anxiety 02/17/2011   Constipation    Depression with anxiety 03/03/2011   GERD (gastroesophageal reflux disease)    Herpes simplex without mention of complication    Insomnia 09/01/2011   Overweight 04/21/2015   Reflux 02/17/2011   Reflux 03/03/2011   Reflux 09/16/2011   Tobacco use 12/25/2016    Past Surgical History:  Procedure Laterality Date   TONSILLECTOMY AND ADENOIDECTOMY     age 29   WISDOM TOOTH EXTRACTION      Family History  Problem Relation Age of Onset   Diabetes Mother        type 2   Hypertension Mother    Diabetes Maternal Grandmother        type 2   Cancer Maternal Grandmother 45       ovarian   Hypertension Maternal Grandmother    Kidney failure Maternal Grandfather    Hypertension Maternal Grandfather    Kidney disease Maternal Grandfather    Diabetes Paternal Grandmother        type 2   Hypertension Paternal Grandmother    Emphysema Paternal Grandfather        smoke   COPD Paternal Grandfather    Cancer Paternal Grandfather 18       prostate cancer   Colon cancer Neg Hx     Social  History   Socioeconomic History   Marital status: Single    Spouse name: Not on file   Number of children: 0   Years of education: Not on file   Highest education level: Associate degree: academic program  Occupational History   Occupation: Unemployed  Tobacco Use   Smoking status: Former    Types: Cigarettes    Quit date: 12/04/2020    Years since quitting: 1.8   Smokeless tobacco: Never  Vaping Use   Vaping Use: Never used  Substance and Sexual Activity   Alcohol use: Yes    Comment: socially   Drug use: Not Currently    Types: Marijuana   Sexual activity: Never    Birth control/protection: Pill  Other Topics Concern   Not on file  Social History Narrative   Occasional soda    Social Determinants of Health   Financial Resource Strain: Medium Risk (10/01/2022)   Overall Financial Resource Strain (CARDIA)    Difficulty of Paying Living Expenses: Somewhat hard  Food Insecurity: No Food Insecurity (10/01/2022)   Hunger Vital Sign    Worried About Running Out of Food in the Last Year: Never true    Ran Out of Food in the Last Year:  Never true  Transportation Needs: No Transportation Needs (10/01/2022)   PRAPARE - Administrator, Civil Service (Medical): No    Lack of Transportation (Non-Medical): No  Physical Activity: Sufficiently Active (10/01/2022)   Exercise Vital Sign    Days of Exercise per Week: 3 days    Minutes of Exercise per Session: 120 min  Stress: Stress Concern Present (10/01/2022)   [employer] of Occupational Health - Occupational Stress Questionnaire    Feeling of Stress : Very much  Social Connections: Socially Isolated (10/01/2022)   Social Connection and Isolation Panel [NHANES]    Frequency of Communication with Friends and Family: Three times a week    Frequency of Social Gatherings with Friends and Family: Once a week    Attends Religious Services: Never    Database administrator or Organizations: No    Attends Museum/gallery exhibitions officer: Not on file    Marital Status: Never married  Intimate Partner Violence: Not on file    Outpatient Medications Prior to Visit  Medication Sig Dispense Refill   hyoscyamine (LEVSIN) 0.125 MG tablet Take 1 tablet (0.125 mg total) by mouth every 6 (six) hours as needed for cramping (AB pain). 20 tablet 0   LORazepam (ATIVAN) 0.5 MG tablet Take 1 tablet by mouth twice daily as needed for anxiety 30 tablet 0   metoprolol tartrate (LOPRESSOR) 25 MG tablet Take 1 tablet by mouth twice daily 60 tablet 0   Multiple Vitamin (MULTIVITAMIN) tablet Take 1 tablet by mouth daily as needed.     zolpidem (AMBIEN) 5 MG tablet Take 1 tablet (5 mg total) by mouth at bedtime as needed for sleep. 30 tablet 1   No facility-administered medications prior to visit.    Allergies  Allergen Reactions   Other Other (See Comments)    HOUSE DUST, sneezing and runny nose    Review of Systems  Constitutional:  Positive for malaise/fatigue. Negative for fever.  HENT:  Negative for congestion.   Eyes:  Negative for blurred vision.  Respiratory:  Negative for shortness of breath.   Cardiovascular:  Negative for chest pain, palpitations and leg swelling.  Gastrointestinal:  Positive for abdominal pain, constipation and diarrhea. Negative for blood in stool and nausea.  Genitourinary:  Negative for dysuria and frequency.  Musculoskeletal:  Negative for falls.  Skin:  Negative for rash.  Neurological:  Positive for tingling. Negative for dizziness, loss of consciousness and headaches.  Endo/Heme/Allergies:  Negative for environmental allergies.  Psychiatric/Behavioral:  Negative for depression. The patient is not nervous/anxious.        Objective:    Physical Exam Constitutional:      General: She is not in acute distress.    Appearance: Normal appearance. She is well-developed. She is not toxic-appearing.  HENT:     Head: Normocephalic and atraumatic.     Right Ear: External ear  normal.     Left Ear: External ear normal.     Nose: Nose normal.  Eyes:     General:        Right eye: No discharge.        Left eye: No discharge.     Conjunctiva/sclera: Conjunctivae normal.  Neck:     Thyroid: No thyromegaly.  Cardiovascular:     Rate and Rhythm: Normal rate and regular rhythm.     Heart sounds: Normal heart sounds. No murmur heard. Pulmonary:     Effort: Pulmonary effort is normal. No respiratory distress.  Breath sounds: Normal breath sounds.  Abdominal:     General: Bowel sounds are normal.     Palpations: Abdomen is soft.     Tenderness: There is no abdominal tenderness. There is no guarding.  Musculoskeletal:        General: Normal range of motion.     Cervical back: Neck supple.  Lymphadenopathy:     Cervical: No cervical adenopathy.  Skin:    General: Skin is warm and dry.  Neurological:     Mental Status: She is alert and oriented to person, place, and time.  Psychiatric:        Mood and Affect: Mood normal.        Behavior: Behavior normal.        Thought Content: Thought content normal.        Judgment: Judgment normal.     BP 136/89 (BP Location: Right Arm, Patient Position: Sitting, Cuff Size: Normal)   Pulse 80   Temp (!) 97.5 F (36.4 C) (Oral)   Resp 16   Ht 5\' 6"  (1.676 m)   Wt 196 lb 3.2 oz (89 kg)   SpO2 98%   BMI 31.67 kg/m  Wt Readings from Last 3 Encounters:  10/01/22 196 lb 3.2 oz (89 kg)  06/04/22 213 lb (96.6 kg)  05/26/22 213 lb 9.6 oz (96.9 kg)    Diabetic Foot Exam - Simple   No data filed    Lab Results  Component Value Date   WBC 8.9 06/04/2022   HGB 14.2 06/04/2022   HCT 43.1 06/04/2022   PLT 426.0 (H) 06/04/2022   GLUCOSE 108 (H) 06/04/2022   CHOL 171 06/04/2022   TRIG 91.0 06/04/2022   HDL 91.60 06/04/2022   LDLCALC 61 06/04/2022   ALT 37 (H) 06/04/2022   AST 28 06/04/2022   NA 135 06/04/2022   K 3.7 06/04/2022   CL 100 06/04/2022   CREATININE 0.74 06/04/2022   BUN 12 06/04/2022   CO2 27  06/04/2022   TSH 2.82 06/04/2022   HGBA1C 5.9 10/15/2020    Lab Results  Component Value Date   TSH 2.82 06/04/2022   Lab Results  Component Value Date   WBC 8.9 06/04/2022   HGB 14.2 06/04/2022   HCT 43.1 06/04/2022   MCV 85.5 06/04/2022   PLT 426.0 (H) 06/04/2022   Lab Results  Component Value Date   NA 135 06/04/2022   K 3.7 06/04/2022   CO2 27 06/04/2022   GLUCOSE 108 (H) 06/04/2022   BUN 12 06/04/2022   CREATININE 0.74 06/04/2022   BILITOT 0.4 06/04/2022   ALKPHOS 136 (H) 06/04/2022   AST 28 06/04/2022   ALT 37 (H) 06/04/2022   PROT 7.4 06/04/2022   ALBUMIN 4.5 06/04/2022   CALCIUM 9.6 06/04/2022   GFR 108.44 06/04/2022   Lab Results  Component Value Date   CHOL 171 06/04/2022   Lab Results  Component Value Date   HDL 91.60 06/04/2022   Lab Results  Component Value Date   LDLCALC 61 06/04/2022   Lab Results  Component Value Date   TRIG 91.0 06/04/2022   Lab Results  Component Value Date   CHOLHDL 2 06/04/2022   Lab Results  Component Value Date   HGBA1C 5.9 10/15/2020       Assessment & Plan:  Primary hypertension Assessment & Plan: Well controlled, no changes to meds. Encouraged heart healthy diet such as the DASH diet and exercise as tolerated.     Tobacco use  Assessment & Plan: Encouraged complete cessation. Discussed need to quit as relates to risk of numerous cancers, cardiac and pulmonary disease as well as neurologic complications. Counseled for greater than 3 minutes she only uses tobacco infrequently, once every couple of months at most.    Sexual behavior with high risk of exposure to communicable disease Assessment & Plan: Patient requests STD testing today check RPR, HIV, GC, Chlamydia, BV, yeast and trich.  Orders: -     HIV Antibody (routine testing w rflx) -     RPR -     Cervicovaginal ancillary only  Pain Assessment & Plan: Patient reports pain describes some paresthesia type sensations in her arms not severe and  she has significant blood work with her hepatology team soon.   Liver lesion Assessment & Plan: [facility] and is undergoing work up now. Drema Pryawn Drazak, NP    Abdominal pain, unspecified abdominal location Assessment & Plan: Intermittent abd pain, diarrhea and constipation. No bloody or tarry stool she is working up possiblity of endometriois with gyn and awaits an US. She should start fiber supplement bid and probiotic eat a high fiber diet, exercise and hydrate well     Danise EdgeStacey Angellina Ferdinand, MD

## 2022-10-02 LAB — CERVICOVAGINAL ANCILLARY ONLY
Bacterial Vaginitis (gardnerella): NEGATIVE
Candida Glabrata: NEGATIVE
Candida Vaginitis: NEGATIVE
Chlamydia: NEGATIVE
Comment: NEGATIVE
Comment: NEGATIVE
Comment: NEGATIVE
Comment: NEGATIVE
Comment: NEGATIVE
Comment: NORMAL
Neisseria Gonorrhea: NEGATIVE
Trichomonas: NEGATIVE

## 2022-10-02 LAB — HIV ANTIBODY (ROUTINE TESTING W REFLEX): HIV 1&2 Ab, 4th Generation: NONREACTIVE

## 2022-10-02 LAB — RPR: RPR Ser Ql: NONREACTIVE

## 2022-11-07 ENCOUNTER — Encounter: Payer: Self-pay | Admitting: Family Medicine

## 2022-11-09 NOTE — Telephone Encounter (Signed)
Lab or actual office visit for the testing?

## 2022-11-11 ENCOUNTER — Ambulatory Visit (INDEPENDENT_AMBULATORY_CARE_PROVIDER_SITE_OTHER): Payer: Medicaid Other | Admitting: Family

## 2022-11-11 ENCOUNTER — Other Ambulatory Visit (HOSPITAL_COMMUNITY)
Admission: RE | Admit: 2022-11-11 | Discharge: 2022-11-11 | Disposition: A | Discharge status: Home / Self Care | Payer: Medicaid Other | Source: Ambulatory Visit | Attending: Family | Admitting: Family

## 2022-11-11 VITALS — BP 135/79 | HR 92 | Temp 98.0°F | Resp 16 | Wt 187.0 lb

## 2022-11-11 DIAGNOSIS — N760 Acute vaginitis: Secondary | ICD-10-CM | POA: Diagnosis present

## 2022-11-11 NOTE — Progress Notes (Signed)
Subjective:     Patient ID: Beverly Pearson, female    DOB: 11-Mar-1991, 32 y.o.   MRN: [id number]  Chief Complaint  Patient presents with   Vaginal Itching    Patient reports having vaginal itching, discomfort and discharge on and off for 4 weeks. Used monistat and fluconasole. Will like to be tested for std.      Vaginal Itching    Patient is in today for reports mild labial itching. Symptoms began 4 weeks ago.  Had sex (vaginal with condom) and oral.  Noted significant labial itching (no ulcers) the next day. Denies odor. Symptoms are mild currently.     Health Maintenance Due  Topic Date Due   DTaP/Tdap/Td (7 - Td or Tdap) 09/25/2014    Past Medical History:  Diagnosis Date   Anxiety 02/17/2011   Constipation    Depression with anxiety 03/03/2011   GERD (gastroesophageal reflux disease)    Herpes simplex without mention of complication    Insomnia 09/01/2011   Overweight 04/21/2015   Reflux 02/17/2011   Reflux 03/03/2011   Reflux 09/16/2011   Tobacco use 12/25/2016    Past Surgical History:  Procedure Laterality Date   TONSILLECTOMY AND ADENOIDECTOMY     age 25   WISDOM TOOTH EXTRACTION      Family History  Problem Relation Age of Onset   Diabetes Mother        type 2   Hypertension Mother    Diabetes Maternal Grandmother        type 2   Cancer Maternal Grandmother 36       ovarian   Hypertension Maternal Grandmother    Kidney failure Maternal Grandfather    Hypertension Maternal Grandfather    Kidney disease Maternal Grandfather    Diabetes Paternal Grandmother        type 2   Hypertension Paternal Grandmother    Emphysema Paternal Grandfather        smoke   COPD Paternal Grandfather    Cancer Paternal Grandfather 62       prostate cancer   Colon cancer Neg Hx     Social History   Socioeconomic History   Marital status: Single    Spouse name: Not on file   Number of children: 0   Years of education: Not on file   Highest education level:  Associate degree: academic program  Occupational History   Occupation: Unemployed  Tobacco Use   Smoking status: Some Days    Types: Cigarettes    Last attempt to quit: 12/04/2020    Years since quitting: 1.9   Smokeless tobacco: Never  Vaping Use   Vaping Use: Never used  Substance and Sexual Activity   Alcohol use: Yes    Comment: socially   Drug use: Not Currently    Types: Marijuana   Sexual activity: Never    Birth control/protection: Pill  Other Topics Concern   Not on file  Social History Narrative   Occasional soda    Social Determinants of Health   Financial Resource Strain: Medium Risk (10/01/2022)   Overall Financial Resource Strain (CARDIA)    Difficulty of Paying Living Expenses: Somewhat hard  Food Insecurity: No Food Insecurity (10/01/2022)   Hunger Vital Sign    Worried About Running Out of Food in the Last Year: Never true    Ran Out of Food in the Last Year: Never true  Transportation Needs: No Transportation Needs (10/01/2022)   PRAPARE - Transportation  Lack of Transportation (Medical): No    Lack of Transportation (Non-Medical): No  Physical Activity: Sufficiently Active (10/01/2022)   Exercise Vital Sign    Days of Exercise per Week: 3 days    Minutes of Exercise per Session: 120 min  Stress: Stress Concern Present (10/01/2022)   [employer] of Occupational Health - Occupational Stress Questionnaire    Feeling of Stress : Very much  Social Connections: Socially Isolated (10/01/2022)   Social Connection and Isolation Panel [NHANES]    Frequency of Communication with Friends and Family: Three times a week    Frequency of Social Gatherings with Friends and Family: Once a week    Attends Religious Services: Never    Database administrator or Organizations: No    Attends Engineer, structural: Not on file    Marital Status: Never married  Intimate Partner Violence: Not on file    Outpatient Medications Prior to Visit  Medication Sig  Dispense Refill   hyoscyamine (LEVSIN) 0.125 MG tablet Take 1 tablet (0.125 mg total) by mouth every 6 (six) hours as needed for cramping (AB pain). 20 tablet 0   LORazepam (ATIVAN) 0.5 MG tablet Take 1 tablet by mouth twice daily as needed for anxiety 30 tablet 0   metoprolol tartrate (LOPRESSOR) 25 MG tablet Take 1 tablet by mouth twice daily 60 tablet 0   Multiple Vitamin (MULTIVITAMIN) tablet Take 1 tablet by mouth daily as needed.     zolpidem (AMBIEN) 5 MG tablet Take 1 tablet (5 mg total) by mouth at bedtime as needed for sleep. 30 tablet 1   No facility-administered medications prior to visit.    Allergies  Allergen Reactions   Other Other (See Comments)    HOUSE DUST, sneezing and runny nose    ROS     Objective:    Physical Exam Exam conducted with a chaperone present.  Constitutional:      Appearance: Normal appearance.  Genitourinary:    Pubic Area: No rash.      Labia:        Right: No rash.        Left: No rash.   Neurological:     Mental Status: She is alert and oriented to person, place, and time.  Psychiatric:        Mood and Affect: Mood normal.        Behavior: Behavior normal.        Thought Content: Thought content normal.        Judgment: Judgment normal.     BP 135/79 (BP Location: Right Arm, Patient Position: Sitting, Cuff Size: Small)   Pulse 92   Temp 98 F (36.7 C) (Oral)   Resp 16   Wt 187 lb (84.8 kg)   SpO2 100%   BMI 30.18 kg/m  Wt Readings from Last 3 Encounters:  11/11/22 187 lb (84.8 kg)  10/01/22 196 lb 3.2 oz (89 kg)  06/04/22 213 lb (96.6 kg)       Assessment & Plan:   Problem List Items Addressed This Visit       Unprioritized   Acute vaginitis - Primary    New.  Will send swab for GC/Chlamydia/Trich/BV/Yeast.  Encouraged ongoing condom use.       Relevant Orders   Cervicovaginal ancillary only( [facility])    I am having Beverly Pearson maintain her multivitamin, hyoscyamine, zolpidem, metoprolol tartrate, and  LORazepam.  No orders of the defined types were placed in this  encounter.

## 2022-11-11 NOTE — Assessment & Plan Note (Signed)
New.  Will send swab for GC/Chlamydia/Trich/BV/Yeast.  Encouraged ongoing condom use.

## 2022-11-12 LAB — CERVICOVAGINAL ANCILLARY ONLY
Bacterial Vaginitis (gardnerella): NEGATIVE
Candida Glabrata: NEGATIVE
Candida Vaginitis: NEGATIVE
Chlamydia: NEGATIVE
Comment: NEGATIVE
Comment: NEGATIVE
Comment: NEGATIVE
Comment: NEGATIVE
Comment: NEGATIVE
Comment: NORMAL
Neisseria Gonorrhea: NEGATIVE
Trichomonas: NEGATIVE

## 2022-12-17 ENCOUNTER — Ambulatory Visit: Payer: Commercial Managed Care - HMO | Admitting: Family Medicine

## 2022-12-31 ENCOUNTER — Ambulatory Visit: Payer: Medicaid Other | Admitting: Family Medicine

## 2023-01-04 NOTE — Assessment & Plan Note (Signed)
 Encouraged good sleep hygiene such as dark, quiet room. No blue/green glowing lights such as computer screens in bedroom. No alcohol or stimulants in evening. Cut down on caffeine as able. Regular exercise is helpful but not just prior to bed time.  

## 2023-01-04 NOTE — Assessment & Plan Note (Signed)
Uses Lorazepam infrequently

## 2023-01-04 NOTE — Assessment & Plan Note (Signed)
 Well controlled, no changes to meds. Encouraged heart healthy diet such as the DASH diet and exercise as tolerated.  °

## 2023-01-04 NOTE — Progress Notes (Unsigned)
Subjective:    Patient ID: Beverly Pearson, female    DOB: 1990-07-05, 32 y.o.   MRN: [id number]  No chief complaint on file.   HPI Discussed the use of AI scribe software for clinical note transcription with the patient, who gave verbal consent to proceed.  History of Present Illness     Patient is a 32 yo female in for follow up on chronic medical concerns. No recent febrile illness or hospitalizations. Denies CP/palp/SOB/HA/congestion/fevers/GI or GU c/o. Taking meds as prescribed        Past Medical History:  Diagnosis Date  . Anxiety 02/17/2011  . Constipation   . Depression with anxiety 03/03/2011  . GERD (gastroesophageal reflux disease)   . Herpes simplex without mention of complication   . Insomnia 09/01/2011  . Overweight 04/21/2015  . Reflux 02/17/2011  . Reflux 03/03/2011  . Reflux 09/16/2011  . Tobacco use 12/25/2016    Past Surgical History:  Procedure Laterality Date  . TONSILLECTOMY AND ADENOIDECTOMY     age 33  . WISDOM TOOTH EXTRACTION      Family History  Problem Relation Age of Onset  . Diabetes Mother        type 2  . Hypertension Mother   . Diabetes Maternal Grandmother        type 2  . Cancer Maternal Grandmother 70       ovarian  . Hypertension Maternal Grandmother   . Kidney failure Maternal Grandfather   . Hypertension Maternal Grandfather   . Kidney disease Maternal Grandfather   . Diabetes Paternal Grandmother        type 2  . Hypertension Paternal Grandmother   . Emphysema Paternal Grandfather        smoke  . COPD Paternal Grandfather   . Cancer Paternal Grandfather 21       prostate cancer  . Colon cancer Neg Hx     Social History   Socioeconomic History  . Marital status: Single    Spouse name: Not on file  . Number of children: 0  . Years of education: Not on file  . Highest education level: Associate degree: academic program  Occupational History  . Occupation: Unemployed  Tobacco Use  . Smoking status: Some Days     Current packs/day: 0.00    Types: Cigarettes    Last attempt to quit: 12/04/2020    Years since quitting: 2.0  . Smokeless tobacco: Never  Vaping Use  . Vaping status: Never Used  Substance and Sexual Activity  . Alcohol use: Yes    Comment: socially  . Drug use: Not Currently    Types: Marijuana  . Sexual activity: Never    Birth control/protection: Pill  Other Topics Concern  . Not on file  Social History Narrative   Occasional soda    Social Determinants of Health   Financial Resource Strain: Medium Risk (10/01/2022)   Overall Financial Resource Strain (CARDIA)   . Difficulty of Paying Living Expenses: Somewhat hard  Food Insecurity: No Food Insecurity (10/01/2022)   Hunger Vital Sign   . Worried About Programme researcher, broadcasting/film/video in the Last Year: Never true   . Ran Out of Food in the Last Year: Never true  Transportation Needs: No Transportation Needs (10/01/2022)   PRAPARE - Transportation   . Lack of Transportation (Medical): No   . Lack of Transportation (Non-Medical): No  Physical Activity: Sufficiently Active (10/01/2022)   Exercise Vital Sign   . Days of Exercise  per Week: 3 days   . Minutes of Exercise per Session: 120 min  Stress: Stress Concern Present (10/01/2022)   [employer] of Occupational Health - Occupational Stress Questionnaire   . Feeling of Stress : Very much  Social Connections: Socially Isolated (10/01/2022)   Social Connection and Isolation Panel [NHANES]   . Frequency of Communication with Friends and Family: Three times a week   . Frequency of Social Gatherings with Friends and Family: Once a week   . Attends Religious Services: Never   . Active Member of Clubs or Organizations: No   . Attends Banker Meetings: Not on file   . Marital Status: Never married  Catering manager Violence: Not on file    Outpatient Medications Prior to Visit  Medication Sig Dispense Refill  . hyoscyamine (LEVSIN) 0.125 MG tablet Take 1 tablet (0.125 mg  total) by mouth every 6 (six) hours as needed for cramping (AB pain). 20 tablet 0  . LORazepam (ATIVAN) 0.5 MG tablet Take 1 tablet by mouth twice daily as needed for anxiety 30 tablet 0  . metoprolol tartrate (LOPRESSOR) 25 MG tablet Take 1 tablet by mouth twice daily 60 tablet 0  . Multiple Vitamin (MULTIVITAMIN) tablet Take 1 tablet by mouth daily as needed.    . zolpidem (AMBIEN) 5 MG tablet Take 1 tablet (5 mg total) by mouth at bedtime as needed for sleep. 30 tablet 1   No facility-administered medications prior to visit.    Allergies  Allergen Reactions  . Other Other (See Comments)    HOUSE DUST, sneezing and runny nose    Review of Systems  Constitutional:  Negative for fever and malaise/fatigue.  HENT:  Negative for congestion.   Eyes:  Negative for blurred vision.  Respiratory:  Negative for shortness of breath.   Cardiovascular:  Negative for chest pain, palpitations and leg swelling.  Gastrointestinal:  Negative for abdominal pain, blood in stool and nausea.  Genitourinary:  Negative for dysuria and frequency.  Musculoskeletal:  Negative for falls.  Skin:  Negative for rash.  Neurological:  Negative for dizziness, loss of consciousness and headaches.  Endo/Heme/Allergies:  Negative for environmental allergies.  Psychiatric/Behavioral:  Negative for depression. The patient is not nervous/anxious.       Objective:    Physical Exam Constitutional:      General: She is not in acute distress.    Appearance: Normal appearance. She is well-developed. She is not toxic-appearing.  HENT:     Head: Normocephalic and atraumatic.     Right Ear: External ear normal.     Left Ear: External ear normal.     Nose: Nose normal.  Eyes:     General:        Right eye: No discharge.        Left eye: No discharge.     Conjunctiva/sclera: Conjunctivae normal.  Neck:     Thyroid: No thyromegaly.  Cardiovascular:     Rate and Rhythm: Normal rate and regular rhythm.     Heart  sounds: Normal heart sounds. No murmur heard. Pulmonary:     Effort: Pulmonary effort is normal. No respiratory distress.     Breath sounds: Normal breath sounds.  Abdominal:     General: Bowel sounds are normal.     Palpations: Abdomen is soft.     Tenderness: There is no abdominal tenderness. There is no guarding.  Musculoskeletal:        General: Normal range of motion.  Cervical back: Neck supple.  Lymphadenopathy:     Cervical: No cervical adenopathy.  Skin:    General: Skin is warm and dry.  Neurological:     Mental Status: She is alert and oriented to person, place, and time.  Psychiatric:        Mood and Affect: Mood normal.        Behavior: Behavior normal.        Thought Content: Thought content normal.        Judgment: Judgment normal.   There were no vitals taken for this visit. Wt Readings from Last 3 Encounters:  11/11/22 187 lb (84.8 kg)  10/01/22 196 lb 3.2 oz (89 kg)  06/04/22 213 lb (96.6 kg)    Diabetic Foot Exam - Simple   No data filed    Lab Results  Component Value Date   WBC 8.9 06/04/2022   HGB 14.2 06/04/2022   HCT 43.1 06/04/2022   PLT 426.0 (H) 06/04/2022   GLUCOSE 108 (H) 06/04/2022   CHOL 171 06/04/2022   TRIG 91.0 06/04/2022   HDL 91.60 06/04/2022   LDLCALC 61 06/04/2022   ALT 37 (H) 06/04/2022   AST 28 06/04/2022   NA 135 06/04/2022   K 3.7 06/04/2022   CL 100 06/04/2022   CREATININE 0.74 06/04/2022   BUN 12 06/04/2022   CO2 27 06/04/2022   TSH 2.82 06/04/2022   HGBA1C 5.9 10/15/2020    Lab Results  Component Value Date   TSH 2.82 06/04/2022   Lab Results  Component Value Date   WBC 8.9 06/04/2022   HGB 14.2 06/04/2022   HCT 43.1 06/04/2022   MCV 85.5 06/04/2022   PLT 426.0 (H) 06/04/2022   Lab Results  Component Value Date   NA 135 06/04/2022   K 3.7 06/04/2022   CO2 27 06/04/2022   GLUCOSE 108 (H) 06/04/2022   BUN 12 06/04/2022   CREATININE 0.74 06/04/2022   BILITOT 0.4 06/04/2022   ALKPHOS 136 (H)  06/04/2022   AST 28 06/04/2022   ALT 37 (H) 06/04/2022   PROT 7.4 06/04/2022   ALBUMIN 4.5 06/04/2022   CALCIUM 9.6 06/04/2022   GFR 108.44 06/04/2022   Lab Results  Component Value Date   CHOL 171 06/04/2022   Lab Results  Component Value Date   HDL 91.60 06/04/2022   Lab Results  Component Value Date   LDLCALC 61 06/04/2022   Lab Results  Component Value Date   TRIG 91.0 06/04/2022   Lab Results  Component Value Date   CHOLHDL 2 06/04/2022   Lab Results  Component Value Date   HGBA1C 5.9 10/15/2020       Assessment & Plan:  Tobacco use Assessment & Plan: Encouraged complete cessation. Discussed need to quit as relates to risk of numerous cancers, cardiac and pulmonary disease as well as neurologic complications. Counseled for greater than 3 minutes    Primary hypertension Assessment & Plan: Well controlled, no changes to meds. Encouraged heart healthy diet such as the DASH diet and exercise as tolerated.     Depression with anxiety Assessment & Plan: Uses Lorazepam infrequently   Insomnia, unspecified type Assessment & Plan: Encouraged good sleep hygiene such as dark, quiet room. No blue/green glowing lights such as computer screens in bedroom. No alcohol or stimulants in evening. Cut down on caffeine as able. Regular exercise is helpful but not just prior to bed time.       Assessment and Plan  Danise Edge, MD

## 2023-01-04 NOTE — Assessment & Plan Note (Signed)
 Encouraged complete cessation. Discussed need to quit as relates to risk of numerous cancers, cardiac and pulmonary disease as well as neurologic complications. Counseled for greater than 3 minutes 

## 2023-01-05 ENCOUNTER — Other Ambulatory Visit: Payer: Self-pay

## 2023-01-05 ENCOUNTER — Ambulatory Visit (INDEPENDENT_AMBULATORY_CARE_PROVIDER_SITE_OTHER): Payer: Medicaid Other | Admitting: Family Medicine

## 2023-01-05 VITALS — BP 134/86 | HR 93 | Temp 98.0°F | Resp 16 | Ht 66.0 in | Wt 179.8 lb

## 2023-01-05 DIAGNOSIS — F418 Other specified anxiety disorders: Secondary | ICD-10-CM

## 2023-01-05 DIAGNOSIS — G4700 Insomnia, unspecified: Secondary | ICD-10-CM | POA: Diagnosis not present

## 2023-01-05 DIAGNOSIS — Z79899 Other long term (current) drug therapy: Secondary | ICD-10-CM

## 2023-01-05 DIAGNOSIS — Z87891 Personal history of nicotine dependence: Secondary | ICD-10-CM

## 2023-01-05 DIAGNOSIS — Z8719 Personal history of other diseases of the digestive system: Secondary | ICD-10-CM

## 2023-01-05 DIAGNOSIS — Z124 Encounter for screening for malignant neoplasm of cervix: Secondary | ICD-10-CM

## 2023-01-05 DIAGNOSIS — I10 Essential (primary) hypertension: Secondary | ICD-10-CM | POA: Diagnosis not present

## 2023-01-05 DIAGNOSIS — R739 Hyperglycemia, unspecified: Secondary | ICD-10-CM

## 2023-01-05 DIAGNOSIS — R300 Dysuria: Secondary | ICD-10-CM

## 2023-01-05 DIAGNOSIS — Z720 Tobacco use: Secondary | ICD-10-CM

## 2023-01-05 LAB — COMPREHENSIVE METABOLIC PANEL
ALT: 26 U/L (ref 0–35)
AST: 24 U/L (ref 0–37)
Albumin: 4.3 g/dL (ref 3.5–5.2)
Alkaline Phosphatase: 130 U/L — ABNORMAL HIGH (ref 39–117)
BUN: 18 mg/dL (ref 6–23)
CO2: 26 mEq/L (ref 19–32)
Calcium: 9.9 mg/dL (ref 8.4–10.5)
Chloride: 104 mEq/L (ref 96–112)
Creatinine, Ser: 0.76 mg/dL (ref 0.40–1.20)
GFR: 104.6 mL/min (ref >60.00)
Glucose, Bld: 100 mg/dL — ABNORMAL HIGH (ref 70–99)
Potassium: 4.3 mEq/L (ref 3.5–5.1)
Sodium: 137 mEq/L (ref 135–145)
Total Bilirubin: 0.6 mg/dL (ref 0.2–1.2)
Total Protein: 7.1 g/dL (ref 6.0–8.3)

## 2023-01-05 LAB — CBC WITH DIFFERENTIAL/PLATELET
Basophils Absolute: 0 10*3/uL (ref 0.0–0.1)
Basophils Relative: 0.3 % (ref 0.0–3.0)
Eosinophils Absolute: 0.1 10*3/uL (ref 0.0–0.7)
Eosinophils Relative: 0.9 % (ref 0.0–5.0)
HCT: 43.3 % (ref 36.0–46.0)
Hemoglobin: 14 g/dL (ref 12.0–15.0)
Lymphocytes Relative: 27.8 % (ref 12.0–46.0)
Lymphs Abs: 2.3 10*3/uL (ref 0.7–4.0)
MCHC: 32.3 g/dL (ref 30.0–36.0)
MCV: 87.8 fl (ref 78.0–100.0)
Monocytes Absolute: 0.5 10*3/uL (ref 0.1–1.0)
Monocytes Relative: 6.2 % (ref 3.0–12.0)
Neutro Abs: 5.4 10*3/uL (ref 1.4–7.7)
Neutrophils Relative %: 64.8 % (ref 43.0–77.0)
Platelets: 328 10*3/uL (ref 150.0–400.0)
RBC: 4.93 Mil/uL (ref 3.87–5.11)
RDW: 14.6 % (ref 11.5–15.5)
WBC: 8.3 10*3/uL (ref 4.0–10.5)

## 2023-01-05 LAB — HEMOGLOBIN A1C: Hgb A1c MFr Bld: 5.7 % (ref 4.6–6.5)

## 2023-01-05 LAB — LIPID PANEL
Cholesterol: 137 mg/dL (ref 0–200)
HDL: 67.6 mg/dL (ref >39.00)
LDL Cholesterol: 58 mg/dL (ref 0–99)
NonHDL: 69.28
Total CHOL/HDL Ratio: 2
Triglycerides: 54 mg/dL (ref 0.0–149.0)
VLDL: 10.8 mg/dL (ref 0.0–40.0)

## 2023-01-05 NOTE — Patient Instructions (Signed)
[employer]ron blood pressure cuff upper arm   Hypertension, Adult High blood pressure (hypertension) is when the force of blood pumping through the arteries is too strong. The arteries are the blood vessels that carry blood from the heart throughout the body. Hypertension forces the heart to work harder to pump blood and may cause arteries to become narrow or stiff. Untreated or uncontrolled hypertension can lead to a heart attack, heart failure, a stroke, kidney disease, and other problems. A blood pressure reading consists of a higher number over a lower number. Ideally, your blood pressure should be below 120/80. The first ("top") number is called the systolic pressure. It is a measure of the pressure in your arteries as your heart beats. The second ("bottom") number is called the diastolic pressure. It is a measure of the pressure in your arteries as the heart relaxes. What are the causes? The exact cause of this condition is not known. There are some conditions that result in high blood pressure. What increases the risk? Certain factors may make you more likely to develop high blood pressure. Some of these risk factors are under your control, including: Smoking. Not getting enough exercise or physical activity. Being overweight. Having too much fat, sugar, calories, or salt (sodium) in your diet. Drinking too much alcohol. Other risk factors include: Having a personal history of heart disease, diabetes, high cholesterol, or kidney disease. Stress. Having a family history of high blood pressure and high cholesterol. Having obstructive sleep apnea. Age. The risk increases with age. What are the signs or symptoms? High blood pressure may not cause symptoms. Very high blood pressure (hypertensive crisis) may cause: Headache. Fast or irregular heartbeats (palpitations). Shortness of breath. Nosebleed. Nausea and vomiting. Vision changes. Severe chest pain, dizziness, and seizures. How is this  diagnosed? This condition is diagnosed by measuring your blood pressure while you are seated, with your arm resting on a flat surface, your legs uncrossed, and your feet flat on the floor. The cuff of the blood pressure monitor will be placed directly against the skin of your upper arm at the level of your heart. Blood pressure should be measured at least twice using the same arm. Certain conditions can cause a difference in blood pressure between your right and left arms. If you have a high blood pressure reading during one visit or you have normal blood pressure with other risk factors, you may be asked to: Return on a different day to have your blood pressure checked again. Monitor your blood pressure at home for 1 week or longer. If you are diagnosed with hypertension, you may have other blood or imaging tests to help your health care provider understand your overall risk for other conditions. How is this treated? This condition is treated by making healthy lifestyle changes, such as eating healthy foods, exercising more, and reducing your alcohol intake. You may be referred for counseling on a healthy diet and physical activity. Your health care provider may prescribe medicine if lifestyle changes are not enough to get your blood pressure under control and if: Your systolic blood pressure is above 130. Your diastolic blood pressure is above 80. Your personal target blood pressure may vary depending on your medical conditions, your age, and other factors. Follow these instructions at home: Eating and drinking  Eat a diet that is high in fiber and potassium, and low in sodium, added sugar, and fat. An example of this eating plan is called the DASH diet. DASH stands for Dietary Approaches  to Stop Hypertension. To eat this way: Eat plenty of fresh fruits and vegetables. Try to fill one half of your plate at each meal with fruits and vegetables. Eat whole grains, such as whole-wheat pasta, brown  rice, or whole-grain bread. Fill about one fourth of your plate with whole grains. Eat or drink low-fat dairy products, such as skim milk or low-fat yogurt. Avoid fatty cuts of meat, processed or cured meats, and poultry with skin. Fill about one fourth of your plate with lean proteins, such as fish, chicken without skin, beans, eggs, or tofu. Avoid pre-made and processed foods. These tend to be higher in sodium, added sugar, and fat. Reduce your daily sodium intake. Many people with hypertension should eat less than 1,500 mg of sodium a day. Do not drink alcohol if: Your health care provider tells you not to drink. You are pregnant, may be pregnant, or are planning to become pregnant. If you drink alcohol: Limit how much you have to: 0-1 drink a day for women. 0-2 drinks a day for men. Know how much alcohol is in your drink. In the [place] one drink equals one 12 oz bottle of beer (355 mL), one 5 oz glass of wine (148 mL), or one 1 oz glass of hard liquor (44 mL). Lifestyle  Work with your health care provider to maintain a healthy body weight or to lose weight. Ask what an ideal weight is for you. Get at least 30 minutes of exercise that causes your heart to beat faster (aerobic exercise) most days of the week. Activities may include walking, swimming, or biking. Include exercise to strengthen your muscles (resistance exercise), such as Pilates or lifting weights, as part of your weekly exercise routine. Try to do these types of exercises for 30 minutes at least 3 days a week. Do not use any products that contain nicotine or tobacco. These products include cigarettes, chewing tobacco, and vaping devices, such as e-cigarettes. If you need help quitting, ask your health care provider. Monitor your blood pressure at home as told by your health care provider. Keep all follow-up visits. This is important. Medicines Take over-the-counter and prescription medicines only as told by your health care  provider. Follow directions carefully. Blood pressure medicines must be taken as prescribed. Do not skip doses of blood pressure medicine. Doing this puts you at risk for problems and can make the medicine less effective. Ask your health care provider about side effects or reactions to medicines that you should watch for. Contact a health care provider if you: Think you are having a reaction to a medicine you are taking. Have headaches that keep coming back (recurring). Feel dizzy. Have swelling in your ankles. Have trouble with your vision. Get help right away if you: Develop a severe headache or confusion. Have unusual weakness or numbness. Feel faint. Have severe pain in your chest or abdomen. Vomit repeatedly. Have trouble breathing. These symptoms may be an emergency. Get help right away. Call 911. Do not wait to see if the symptoms will go away. Do not drive yourself to the hospital. Summary Hypertension is when the force of blood pumping through your arteries is too strong. If this condition is not controlled, it may put you at risk for serious complications. Your personal target blood pressure may vary depending on your medical conditions, your age, and other factors. For most people, a normal blood pressure is less than 120/80. Hypertension is treated with lifestyle changes, medicines, or a combination of both.  Lifestyle changes include losing weight, eating a healthy, low-sodium diet, exercising more, and limiting alcohol. This information is not intended to replace advice given to you by your health care provider. Make sure you discuss any questions you have with your health care provider. Document Revised: 04/15/2021 Document Reviewed: 04/15/2021 Elsevier Patient Education  2024 [employer].

## 2023-01-06 ENCOUNTER — Encounter: Payer: Self-pay | Admitting: Family Medicine

## 2023-01-06 LAB — HSV(HERPES SIMPLEX VRS) I + II AB-IGG
HAV 1 IGG,TYPE SPECIFIC AB: <0.9 index
HSV 2 IGG,TYPE SPECIFIC AB: <0.9 index

## 2023-01-06 LAB — TSH: TSH: 3.26 u[IU]/mL (ref 0.35–5.50)

## 2023-01-07 ENCOUNTER — Telehealth: Payer: Self-pay

## 2023-01-07 LAB — DRUG MONITORING PANEL 376104, URINE
Amphetamines: NEGATIVE ng/mL (ref <500)
Barbiturates: NEGATIVE ng/mL (ref <300)
Benzodiazepines: NEGATIVE ng/mL (ref <100)
Cocaine Metabolite: NEGATIVE ng/mL (ref <150)
Desmethyltramadol: NEGATIVE ng/mL (ref <100)
Opiates: NEGATIVE ng/mL (ref <100)
Oxycodone: NEGATIVE ng/mL (ref <100)
Tramadol: NEGATIVE ng/mL (ref <100)

## 2023-01-07 LAB — DM TEMPLATE

## 2023-01-07 NOTE — Telephone Encounter (Signed)
Called patient to schedule new patient appointment. Left voicemail with our contact information to call back and schedule.  

## 2023-02-19 ENCOUNTER — Encounter: Payer: Self-pay | Admitting: Family Medicine

## 2023-02-19 ENCOUNTER — Other Ambulatory Visit: Payer: Self-pay | Admitting: Family Medicine

## 2023-02-19 MED ORDER — FLUCONAZOLE 150 MG PO TABS: 150.0000 mg | ORAL_TABLET | ORAL | 1 refills | Status: DC

## 2023-02-23 ENCOUNTER — Encounter: Payer: Self-pay | Admitting: Gastroenterology

## 2023-03-01 ENCOUNTER — Encounter: Payer: Medicaid Other | Admitting: Obstetrics and Gynecology

## 2023-03-01 NOTE — Progress Notes (Deleted)
   ANNUAL EXAM Patient name: Beverly Pearson MRN [id number]  Date of birth: 11-18-1990 Chief Complaint:   No chief complaint on file.  History of Present Illness:   Beverly Pearson is a 32 y.o. G0P0 female being seen today for a routine annual exam.   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 Now does ***  No LMP recorded. (Menstrual status: Oral contraceptives).   Last Pap/Pap History: 2021. Results were: NILM w/ HRHPV not done. H/O abnormal pap: no   Health Maintenance Due  Topic Date Due   COVID-19 Vaccine (1) Never done   DTaP/Tdap/Td (7 - Td or Tdap) 09/25/2014   INFLUENZA VACCINE  01/21/2023    Review of Systems:   Pertinent items are noted in HPI Denies any headaches, blurred vision, fatigue, shortness of breath, chest pain, abdominal pain, abnormal vaginal discharge/itching/odor/irritation, problems with periods, bowel movements, urination, or intercourse unless otherwise stated above. *** Pertinent History Reviewed:  Reviewed past medical,surgical, social and family history.  Reviewed problem list, medications and allergies. Physical Assessment:  There were no vitals filed for this visit.There is no height or weight on file to calculate BMI.   Physical Examination:  General appearance - well appearing, and in no distress Mental status - alert, oriented to person, place, and time Psych:  She has a normal mood and affect Skin - warm and dry, normal color, no suspicious lesions noted Chest - effort normal Heart - normal rate  Breasts - breasts appear normal, no suspicious masses, no skin or nipple changes or axillary nodes Abdomen - soft, nontender, nondistended, no masses or organomegaly Pelvic -  VULVA: normal appearing vulva with no masses, tenderness or lesions  VAGINA: normal appearing vagina with normal color and discharge, no lesions  CERVIX: normal appearing cervix without discharge or lesions, no CMT UTERUS:  uterus is felt to be normal size, shape, consistency and nontender  ADNEXA: No adnexal masses or tenderness noted. Extremities:  No swelling or varicosities noted  Chaperone present for exam  No results found for this or any previous visit (from the past 24 hour(s)).  Assessment & Plan:  Diagnoses and all orders for this visit:  Encounter for annual routine gynecological examination  - Cervical cancer screening: Discussed guidelines. Pap with HPV done - Gardasil: has not yet had. Will provide information - GC/CT: {Blank single:19197::"accepts","declines","not indicated"} - Birth Control: {Birth control type:23956} - Breast Health: Encouraged self breast awareness/SBE. Teaching provided.  - F/U 12 months and prn     No orders of the defined types were placed in this encounter.   Meds: No orders of the defined types were placed in this encounter.   Follow-up: No follow-ups on file.  Milas Hock, MD 03/01/2023 9:15 AM

## 2023-04-14 ENCOUNTER — Other Ambulatory Visit: Payer: Self-pay | Admitting: Nurse Practitioner

## 2023-04-14 DIAGNOSIS — R748 Abnormal levels of other serum enzymes: Secondary | ICD-10-CM

## 2023-04-14 DIAGNOSIS — D134 Benign neoplasm of liver: Secondary | ICD-10-CM

## 2023-04-15 NOTE — Progress Notes (Signed)
ANNUAL EXAM Patient name: Beverly Pearson MRN [id number]  Date of birth: [birth year] Chief Complaint:   Annual Exam  History of Present Illness:   Beverly Pearson is a 32 y.o. G0P0 female being seen today for a routine annual exam.   Current concerns:   Prior gyne was Dr. Cherly Hensen. 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 She has had gardasil.   She would like all STI testing.   She may become sexually active. Would like POPs if needed. Reports painful heavy periods. Would like to know what she could try for that.   Current birth control: None. Has hepatic adenoma and HTN. Would not be candidate for combined OCPs.   Patient's last menstrual period was 03/31/2023.   Last Pap/Pap History: 2022 -normal   Health Maintenance Due  Topic Date Due   COVID-19 Vaccine (1) Never done   DTaP/Tdap/Td (7 - Td or Tdap) 09/25/2014   INFLUENZA VACCINE  01/21/2023    Review of Systems:   Pertinent items are noted in HPI Denies any headaches, blurred vision, fatigue, shortness of breath, chest pain, abdominal pain, abnormal vaginal discharge/itching/odor/irritation, problems with periods, bowel movements, urination, or intercourse unless otherwise stated above.  Pertinent History Reviewed:  Reviewed past medical,surgical, social and family history.  Reviewed problem list, medications and allergies. Physical Assessment:   Vitals:   04/23/23 0903 04/23/23 0912  BP: (!) 133/91 (!) 136/96  Pulse: 77 79  Weight: 170 lb (77.1 kg)   Body mass index is 27.44 kg/m.   Physical Examination:  General appearance - well appearing, and in no distress Mental status - alert, oriented to person, place, and time Psych:  She has a normal mood and affect Skin - warm and dry, normal color, no suspicious lesions noted Chest - effort normal Heart - normal rate  Breasts - breasts appear normal, no suspicious masses, no skin or nipple changes or axillary nodes Abdomen - soft, nontender,  nondistended, no masses or organomegaly Pelvic -  VULVA: normal appearing vulva with no masses, tenderness or lesions  VAGINA: normal appearing vagina with normal color and discharge, no lesions  CERVIX: normal appearing cervix without discharge or lesions, no CMT UTERUS: uterus is felt to be normal size, shape, consistency and nontender  ADNEXA: No adnexal masses or tenderness noted. Extremities:  No swelling or varicosities noted  Chaperone present for exam  No results found for this or any previous visit (from the past 24 hour(s)).  Assessment & Plan:  Beverly was seen today for annual exam.  Diagnoses and all orders for this visit:  Encounter for annual routine gynecological examination - Cervical cancer screening: Discussed guidelines. Pap with HPV done - Gardasil: completed - GC/CT: accepts along with other OCPs.  - Birth Control: Possibly POP. Discussed all other options for progesterone only birth control.  - Breast Health: Encouraged self breast awareness/SBE. Teaching provided.  - F/U 12 months and prn -     Cytology - PAP -     HSV-2 Ab, IgG  Screening examination for STI Reviewed limits of HSV testing - does not 100% reflect genital HSV -     Cytology - PAP -     HIV Antibody (routine testing w rflx) -     Hepatitis B surface antigen -     RPR -     Hepatitis C antibody  Dysmenorrhea Will try TXA for painful heavy periods. If this doesn't work, could also do POP.  -  tranexamic acid (LYSTEDA) 650 MG TABS tablet; Take 2 tablets (1,300 mg total) by mouth 3 (three) times daily. Take during menses for a maximum of five days -     Drospirenone (SLYND) 4 MG TABS; Take 1 tablet (4 mg total) by mouth daily.    Orders Placed This Encounter  Procedures   HIV Antibody (routine testing w rflx)   Hepatitis B surface antigen   RPR   Hepatitis C antibody   HSV-2 Ab, IgG    Meds:  Meds ordered this encounter  Medications   tranexamic acid (LYSTEDA) 650 MG TABS  tablet    Sig: Take 2 tablets (1,300 mg total) by mouth 3 (three) times daily. Take during menses for a maximum of five days    Dispense:  30 tablet    Refill:  2   Drospirenone (SLYND) 4 MG TABS    Sig: Take 1 tablet (4 mg total) by mouth daily.    Dispense:  84 tablet    Refill:  3    Follow-up: No follow-ups on file.  Milas Hock, MD 04/23/2023 9:34 AM

## 2023-04-23 ENCOUNTER — Ambulatory Visit: Payer: Medicaid Other | Admitting: Obstetrics and Gynecology

## 2023-04-23 ENCOUNTER — Other Ambulatory Visit (HOSPITAL_COMMUNITY)
Admission: RE | Admit: 2023-04-23 | Discharge: 2023-04-23 | Disposition: A | Discharge status: Home / Self Care | Payer: Medicaid Other | Source: Ambulatory Visit | Attending: Obstetrics and Gynecology | Admitting: Obstetrics and Gynecology

## 2023-04-23 ENCOUNTER — Encounter: Payer: Self-pay | Admitting: Obstetrics and Gynecology

## 2023-04-23 ENCOUNTER — Other Ambulatory Visit: Payer: Self-pay | Admitting: Obstetrics and Gynecology

## 2023-04-23 VITALS — BP 136/96 | HR 79 | Wt 170.0 lb

## 2023-04-23 DIAGNOSIS — Z113 Encounter for screening for infections with a predominantly sexual mode of transmission: Secondary | ICD-10-CM | POA: Diagnosis not present

## 2023-04-23 DIAGNOSIS — N946 Dysmenorrhea, unspecified: Secondary | ICD-10-CM

## 2023-04-23 DIAGNOSIS — Z01419 Encounter for gynecological examination (general) (routine) without abnormal findings: Secondary | ICD-10-CM | POA: Diagnosis present

## 2023-04-23 MED ORDER — TRANEXAMIC ACID 650 MG PO TABS: 1300.0000 mg | ORAL_TABLET | Freq: Three times a day (TID) | ORAL | 2 refills | Status: DC

## 2023-04-23 MED ORDER — SLYND 4 MG PO TABS: 1.0000 | ORAL_TABLET | Freq: Every day | ORAL | 3 refills | Status: DC

## 2023-04-23 NOTE — Progress Notes (Signed)
Pt is in office for routine AEX- would like all std screening including HSV.  Pt would like to discuss BC options, pt states she needs non estrogen options.

## 2023-04-24 LAB — HEPATITIS B SURFACE ANTIGEN: Hepatitis B Surface Ag: NEGATIVE

## 2023-04-24 LAB — HEPATITIS C ANTIBODY: Hep C Virus Ab: NONREACTIVE

## 2023-04-24 LAB — HIV ANTIBODY (ROUTINE TESTING W REFLEX): HIV Screen 4th Generation wRfx: NONREACTIVE

## 2023-04-24 LAB — RPR: RPR Ser Ql: NONREACTIVE

## 2023-04-28 LAB — CYTOLOGY - PAP
Chlamydia: NEGATIVE
Comment: NEGATIVE
Comment: NEGATIVE
Comment: NEGATIVE
Comment: NORMAL
Diagnosis: NEGATIVE
High risk HPV: NEGATIVE
Neisseria Gonorrhea: NEGATIVE
Trichomonas: NEGATIVE

## 2023-04-29 ENCOUNTER — Encounter: Payer: Self-pay | Admitting: Obstetrics and Gynecology

## 2023-04-30 LAB — HSV-2 AB, IGG: HSV 2 IgG, Type Spec: NONREACTIVE

## 2023-04-30 LAB — SPECIMEN STATUS REPORT

## 2023-06-02 ENCOUNTER — Ambulatory Visit
Admission: RE | Admit: 2023-06-02 | Discharge: 2023-06-02 | Disposition: A | Discharge status: Home / Self Care | Payer: Medicaid Other | Source: Ambulatory Visit | Attending: Nurse Practitioner | Admitting: Nurse Practitioner

## 2023-06-02 DIAGNOSIS — R748 Abnormal levels of other serum enzymes: Secondary | ICD-10-CM

## 2023-06-02 DIAGNOSIS — D134 Benign neoplasm of liver: Secondary | ICD-10-CM

## 2023-06-02 MED ORDER — GADOXETATE DISODIUM 0.25 MMOL/ML IV SOLN
9.0000 mL | Freq: Once | INTRAVENOUS | Status: AC | PRN
  Administered 2023-06-02: 9 mL via INTRAVENOUS

## 2023-06-22 ENCOUNTER — Encounter: Payer: Self-pay | Admitting: Family Medicine

## 2023-06-25 NOTE — Telephone Encounter (Signed)
 Spoke with patient. Has a CPE scheduled 07/26/2023. Will discuss menstrual issues then.

## 2023-06-25 NOTE — Telephone Encounter (Signed)
 Called patient. No answer. LVM

## 2023-07-25 NOTE — Assessment & Plan Note (Signed)
Patient encouraged to maintain heart healthy diet, regular exercise, adequate sleep. Consider daily probiotics. Take medications as prescribed. Labs ordered and reviewed. Given and reviewed copy of ACP documents from [facility] Secretary of [place] and encouraged to complete and return  Pap 04/2023 repeat every 3-5 years

## 2023-07-25 NOTE — Assessment & Plan Note (Signed)
 Well controlled, no changes to meds. Encouraged heart healthy diet such as the DASH diet and exercise as tolerated.

## 2023-07-26 ENCOUNTER — Encounter: Payer: Self-pay | Admitting: Family Medicine

## 2023-07-26 ENCOUNTER — Ambulatory Visit (INDEPENDENT_AMBULATORY_CARE_PROVIDER_SITE_OTHER): Payer: Medicaid Other | Admitting: Family Medicine

## 2023-07-26 VITALS — BP 132/80 | HR 66 | Temp 98.0°F | Resp 16 | Ht 66.0 in | Wt 172.4 lb

## 2023-07-26 DIAGNOSIS — Z8042 Family history of malignant neoplasm of prostate: Secondary | ICD-10-CM | POA: Diagnosis not present

## 2023-07-26 DIAGNOSIS — I10 Essential (primary) hypertension: Secondary | ICD-10-CM | POA: Diagnosis not present

## 2023-07-26 DIAGNOSIS — G8929 Other chronic pain: Secondary | ICD-10-CM | POA: Diagnosis not present

## 2023-07-26 DIAGNOSIS — M25511 Pain in right shoulder: Secondary | ICD-10-CM

## 2023-07-26 DIAGNOSIS — R739 Hyperglycemia, unspecified: Secondary | ICD-10-CM

## 2023-07-26 DIAGNOSIS — Z8041 Family history of malignant neoplasm of ovary: Secondary | ICD-10-CM | POA: Diagnosis not present

## 2023-07-26 DIAGNOSIS — Z0000 Encounter for general adult medical examination without abnormal findings: Secondary | ICD-10-CM

## 2023-07-26 LAB — CBC WITH DIFFERENTIAL/PLATELET
Basophils Absolute: 0 10*3/uL (ref 0.0–0.1)
Basophils Relative: 0.7 % (ref 0.0–3.0)
Eosinophils Absolute: 0.1 10*3/uL (ref 0.0–0.7)
Eosinophils Relative: 2 % (ref 0.0–5.0)
HCT: 44.3 % (ref 36.0–46.0)
Hemoglobin: 14.5 g/dL (ref 12.0–15.0)
Lymphocytes Relative: 40.1 % (ref 12.0–46.0)
Lymphs Abs: 2.2 10*3/uL (ref 0.7–4.0)
MCHC: 32.7 g/dL (ref 30.0–36.0)
MCV: 88.4 fL (ref 78.0–100.0)
Monocytes Absolute: 0.3 10*3/uL (ref 0.1–1.0)
Monocytes Relative: 5.5 % (ref 3.0–12.0)
Neutro Abs: 2.9 10*3/uL (ref 1.4–7.7)
Neutrophils Relative %: 51.7 % (ref 43.0–77.0)
Platelets: 347 10*3/uL (ref 150.0–400.0)
RBC: 5.01 Mil/uL (ref 3.87–5.11)
RDW: 14.2 % (ref 11.5–15.5)
WBC: 5.6 10*3/uL (ref 4.0–10.5)

## 2023-07-26 LAB — COMPREHENSIVE METABOLIC PANEL
ALT: 13 U/L (ref 0–35)
AST: 19 U/L (ref 0–37)
Albumin: 4.4 g/dL (ref 3.5–5.2)
Alkaline Phosphatase: 103 U/L (ref 39–117)
BUN: 12 mg/dL (ref 6–23)
CO2: 29 meq/L (ref 19–32)
Calcium: 9.5 mg/dL (ref 8.4–10.5)
Chloride: 103 meq/L (ref 96–112)
Creatinine, Ser: 0.78 mg/dL (ref 0.40–1.20)
GFR: 100.99 mL/min (ref >60.00)
Glucose, Bld: 88 mg/dL (ref 70–99)
Potassium: 4.8 meq/L (ref 3.5–5.1)
Sodium: 140 meq/L (ref 135–145)
Total Bilirubin: 0.5 mg/dL (ref 0.2–1.2)
Total Protein: 7.3 g/dL (ref 6.0–8.3)

## 2023-07-26 LAB — LIPID PANEL
Cholesterol: 190 mg/dL (ref 0–200)
HDL: 98.7 mg/dL (ref >39.00)
LDL Cholesterol: 85 mg/dL (ref 0–99)
NonHDL: 91.79
Total CHOL/HDL Ratio: 2
Triglycerides: 36 mg/dL (ref 0.0–149.0)
VLDL: 7.2 mg/dL (ref 0.0–40.0)

## 2023-07-26 LAB — HEMOGLOBIN A1C: Hgb A1c MFr Bld: 5.7 % (ref 4.6–6.5)

## 2023-07-26 LAB — TSH: TSH: 2.05 u[IU]/mL (ref 0.35–5.50)

## 2023-07-26 NOTE — Patient Instructions (Addendum)
Multivitamins with minerals selenium and zinc  Fish or krill or flaxseed oil caps daily NOW company supplements Biotin    Preventive Care 11-32 Years Old, Female Preventive care refers to lifestyle choices and visits with your health care provider that can promote health and wellness. Preventive care visits are also called wellness exams. What can I expect for my preventive care visit? Counseling During your preventive care visit, your health care provider may ask about your: Medical history, including: Past medical problems. Family medical history. Pregnancy history. Current health, including: Menstrual cycle. Method of birth control. Emotional well-being. Home life and relationship well-being. Sexual activity and sexual health. Lifestyle, including: Alcohol, nicotine or tobacco, and drug use. Access to firearms. Diet, exercise, and sleep habits. Work and work Astronomer. Sunscreen use. Safety issues such as seatbelt and bike helmet use. Physical exam Your health care provider may check your: Height and weight. These may be used to calculate your BMI (body mass index). BMI is a measurement that tells if you are at a healthy weight. Waist circumference. This measures the distance around your waistline. This measurement also tells if you are at a healthy weight and may help predict your risk of certain diseases, such as type 2 diabetes and high blood pressure. Heart rate and blood pressure. Body temperature. Skin for abnormal spots. What immunizations do I need?  Vaccines are usually given at various ages, according to a schedule. Your health care provider will recommend vaccines for you based on your age, medical history, and lifestyle or other factors, such as travel or where you work. What tests do I need? Screening Your health care provider may recommend screening tests for certain conditions. This may include: Pelvic exam and Pap test. Lipid and cholesterol  levels. Diabetes screening. This is done by checking your blood sugar (glucose) after you have not eaten for a while (fasting). Hepatitis B test. Hepatitis C test. HIV (human immunodeficiency virus) test. STI (sexually transmitted infection) testing, if you are at risk. BRCA-related cancer screening. This may be done if you have a family history of breast, ovarian, tubal, or peritoneal cancers. Talk with your health care provider about your test results, treatment options, and if necessary, the need for more tests. Follow these instructions at home: Eating and drinking  Eat a healthy diet that includes fresh fruits and vegetables, whole grains, lean protein, and low-fat dairy products. Take vitamin and mineral supplements as recommended by your health care provider. Do not drink alcohol if: Your health care provider tells you not to drink. You are pregnant, may be pregnant, or are planning to become pregnant. If you drink alcohol: Limit how much you have to 0-1 drink a day. Know how much alcohol is in your drink. In the [place] one drink equals one 12 oz bottle of beer (355 mL), one 5 oz glass of wine (148 mL), or one 1 oz glass of hard liquor (44 mL). Lifestyle Brush your teeth every morning and night with fluoride toothpaste. Floss one time each day. Exercise for at least 30 minutes 5 or more days each week. Do not use any products that contain nicotine or tobacco. These products include cigarettes, chewing tobacco, and vaping devices, such as e-cigarettes. If you need help quitting, ask your health care provider. Do not use drugs. If you are sexually active, practice safe sex. Use a condom or other form of protection to prevent STIs. If you do not wish to become pregnant, use a form of birth control. If you  plan to become pregnant, see your health care provider for a prepregnancy visit. Find healthy ways to manage stress, such as: Meditation, yoga, or listening to  music. Journaling. Talking to a trusted person. Spending time with friends and family. Minimize exposure to UV radiation to reduce your risk of skin cancer. Safety Always wear your seat belt while driving or riding in a vehicle. Do not drive: If you have been drinking alcohol. Do not ride with someone who has been drinking. If you have been using any mind-altering substances or drugs. While texting. When you are tired or distracted. Wear a helmet and other protective equipment during sports activities. If you have firearms in your house, make sure you follow all gun safety procedures. Seek help if you have been physically or sexually abused. What's next? Go to your health care provider once a year for an annual wellness visit. Ask your health care provider how often you should have your eyes and teeth checked. Stay up to date on all vaccines. This information is not intended to replace advice given to you by your health care provider. Make sure you discuss any questions you have with your health care provider. Document Revised: 12/04/2020 Document Reviewed: 12/04/2020 Elsevier Patient Education  2024 [employer].

## 2023-07-26 NOTE — Progress Notes (Signed)
Subjective:    Patient ID: Beverly Pearson, female    DOB: 28-Oct-1990, 33 y.o.   MRN: [id number]  Chief Complaint  Patient presents with  . Annual Exam    HPI Discussed the use of AI scribe software for clinical note transcription with the patient, who gave verbal consent to proceed.  History of Present Illness   The patient, with a history of liver tumors, presents with three main concerns. The first is worsening period pain, which has been particularly severe since discontinuing birth control a year ago on the advice of their hematologist due to liver tumors. The patient reports irregular menstrual cycles ranging from 24 to 33 days, with severe cramps that have been increasingly painful since stopping birth control. The patient has been prescribed tranexamic acid by their OB/GYN to manage the pain and heavy flow, but has only recently started this treatment.  The second concern is persistent shoulder pain that started in August following a specific incident. The patient describes a popping sensation in their shoulder, followed by two weeks of excruciating pain that limited their range of motion. While the range of motion has since improved, the patient continues to experience aching pain that occasionally radiates down the arm to the elbow.  The third concern is hair loss, which the patient attributes to stress and dietary changes. The patient reports no specific patches of hair loss, but a general thinning of hair. They have started taking a multivitamin and a collagen booster supplement in the past two months to address this issue.        Past Medical History:  Diagnosis Date  . Anxiety 02/17/2011  . Constipation   . Depression with anxiety 03/03/2011  . GERD (gastroesophageal reflux disease)   . Herpes simplex without mention of complication   . Insomnia 09/01/2011  . Overweight 04/21/2015  . Reflux 02/17/2011  . Reflux 03/03/2011  . Reflux 09/16/2011  . Tobacco use 12/25/2016     Past Surgical History:  Procedure Laterality Date  . TONSILLECTOMY AND ADENOIDECTOMY     age 76  . WISDOM TOOTH EXTRACTION      Family History  Problem Relation Age of Onset  . Diabetes Mother        type 2  . Hypertension Mother   . Diabetes Maternal Grandmother        type 2  . Cancer Maternal Grandmother 70       ovarian  . Hypertension Maternal Grandmother   . Kidney failure Maternal Grandfather   . Hypertension Maternal Grandfather   . Kidney disease Maternal Grandfather   . Diabetes Paternal Grandmother        type 2  . Hypertension Paternal Grandmother   . Emphysema Paternal Grandfather        smoke  . COPD Paternal Grandfather   . Cancer Paternal Grandfather 56       prostate cancer  . Colon cancer Neg Hx     Social History   Socioeconomic History  . Marital status: Single    Spouse name: Not on file  . Number of children: 0  . Years of education: Not on file  . Highest education level: Associate degree: academic program  Occupational History  . Occupation: Unemployed  Tobacco Use  . Smoking status: Some Days    Current packs/day: 0.00    Types: Cigarettes    Last attempt to quit: 12/04/2020    Years since quitting: 2.6  . Smokeless tobacco: Never  Vaping Use  .  Vaping status: Never Used  Substance and Sexual Activity  . Alcohol use: Yes    Comment: socially  . Drug use: Not Currently    Types: Marijuana  . Sexual activity: Never    Birth control/protection: Pill  Other Topics Concern  . Not on file  Social History Narrative   Occasional soda    Social Drivers of Health   Financial Resource Strain: Medium Risk (10/01/2022)   Overall Financial Resource Strain (CARDIA)   . Difficulty of Paying Living Expenses: Somewhat hard  Food Insecurity: No Food Insecurity (10/01/2022)   Hunger Vital Sign   . Worried About Programme researcher, broadcasting/film/video in the Last Year: Never true   . Ran Out of Food in the Last Year: Never true  Transportation Needs: No  Transportation Needs (10/01/2022)   PRAPARE - Transportation   . Lack of Transportation (Medical): No   . Lack of Transportation (Non-Medical): No  Physical Activity: Sufficiently Active (10/01/2022)   Exercise Vital Sign   . Days of Exercise per Week: 3 days   . Minutes of Exercise per Session: 120 min  Stress: Stress Concern Present (10/01/2022)   [employer] of Occupational Health - Occupational Stress Questionnaire   . Feeling of Stress : Very much  Social Connections: Socially Isolated (10/01/2022)   Social Connection and Isolation Panel [NHANES]   . Frequency of Communication with Friends and Family: Three times a week   . Frequency of Social Gatherings with Friends and Family: Once a week   . Attends Religious Services: Never   . Active Member of Clubs or Organizations: No   . Attends Banker Meetings: Not on file   . Marital Status: Never married  Intimate Partner Violence: Not on file    Outpatient Medications Prior to Visit  Medication Sig Dispense Refill  . Drospirenone (SLYND) 4 MG TABS Take 1 tablet (4 mg total) by mouth daily. 84 tablet 3  . LORazepam (ATIVAN) 0.5 MG tablet Take 1 tablet by mouth twice daily as needed for anxiety 30 tablet 0  . Multiple Vitamin (MULTIVITAMIN) tablet Take 1 tablet by mouth daily as needed.    . tranexamic acid (LYSTEDA) 650 MG TABS tablet Take 2 tablets (1,300 mg total) by mouth 3 (three) times daily. Take during menses for a maximum of five days 30 tablet 2  . zolpidem (AMBIEN) 5 MG tablet Take 1 tablet (5 mg total) by mouth at bedtime as needed for sleep. 30 tablet 1  . hyoscyamine (LEVSIN) 0.125 MG tablet Take 1 tablet (0.125 mg total) by mouth every 6 (six) hours as needed for cramping (AB pain). (Patient not taking: Reported on 07/26/2023) 20 tablet 0   No facility-administered medications prior to visit.    Allergies  Allergen Reactions  . Other Other (See Comments)    HOUSE DUST, sneezing and runny nose     Review of Systems  Constitutional:  Negative for chills, fever and malaise/fatigue.  HENT:  Negative for congestion and hearing loss.   Eyes:  Negative for discharge.  Respiratory:  Negative for cough, sputum production and shortness of breath.   Cardiovascular:  Negative for chest pain, palpitations and leg swelling.  Gastrointestinal:  Positive for abdominal pain. Negative for blood in stool, constipation, diarrhea, heartburn, nausea and vomiting.  Genitourinary:  Negative for dysuria, frequency, hematuria and urgency.  Musculoskeletal:  Positive for joint pain. Negative for back pain, falls and myalgias.  Skin:  Negative for rash.  Neurological:  Negative for dizziness,  sensory change, loss of consciousness, weakness and headaches.  Endo/Heme/Allergies:  Negative for environmental allergies. Does not bruise/bleed easily.  Psychiatric/Behavioral:  Negative for depression and suicidal ideas. The patient is not nervous/anxious and does not have insomnia.       Objective:    Physical Exam Constitutional:      General: She is not in acute distress.    Appearance: Normal appearance. She is not diaphoretic.  HENT:     Head: Normocephalic and atraumatic.     Right Ear: Tympanic membrane, ear canal and external ear normal.     Left Ear: Tympanic membrane, ear canal and external ear normal.     Nose: Nose normal.     Mouth/Throat:     Mouth: Mucous membranes are moist.     Pharynx: Oropharynx is clear. No oropharyngeal exudate.  Eyes:     General: No scleral icterus.       Right eye: No discharge.        Left eye: No discharge.     Conjunctiva/sclera: Conjunctivae normal.     Pupils: Pupils are equal, round, and reactive to light.  Neck:     Thyroid: No thyromegaly.  Cardiovascular:     Rate and Rhythm: Normal rate and regular rhythm.     Heart sounds: Normal heart sounds. No murmur heard. Pulmonary:     Effort: Pulmonary effort is normal. No respiratory distress.     Breath  sounds: Normal breath sounds. No wheezing or rales.  Abdominal:     General: Bowel sounds are normal. There is no distension.     Palpations: Abdomen is soft. There is no mass.     Tenderness: There is no abdominal tenderness.  Musculoskeletal:        General: No tenderness. Normal range of motion.     Cervical back: Normal range of motion and neck supple.  Lymphadenopathy:     Cervical: No cervical adenopathy.  Skin:    General: Skin is warm and dry.     Findings: No rash.  Neurological:     General: No focal deficit present.     Mental Status: She is alert and oriented to person, place, and time.     Cranial Nerves: No cranial nerve deficit.     Coordination: Coordination normal.     Deep Tendon Reflexes: Reflexes are normal and symmetric. Reflexes normal.  Psychiatric:        Mood and Affect: Mood normal.        Behavior: Behavior normal.        Thought Content: Thought content normal.        Judgment: Judgment normal.   BP 132/80 (BP Location: Left Arm, Patient Position: Sitting, Cuff Size: Normal)   Pulse 66   Temp 98 F (36.7 C) (Oral)   Resp 16   Ht 5\' 6"  (1.676 m)   Wt 172 lb 6.4 oz (78.2 kg)   LMP 07/20/2023   SpO2 97%   BMI 27.83 kg/m  Wt Readings from Last 3 Encounters:  07/26/23 172 lb 6.4 oz (78.2 kg)  04/23/23 170 lb (77.1 kg)  01/05/23 179 lb 12.8 oz (81.6 kg)    Diabetic Foot Exam - Simple   No data filed    Lab Results  Component Value Date   WBC 5.6 07/26/2023   HGB 14.5 07/26/2023   HCT 44.3 07/26/2023   PLT 347.0 07/26/2023   GLUCOSE 88 07/26/2023   CHOL 190 07/26/2023   TRIG 36.0 07/26/2023  HDL 98.70 07/26/2023   LDLCALC 85 07/26/2023   ALT 13 07/26/2023   AST 19 07/26/2023   NA 140 07/26/2023   K 4.8 07/26/2023   CL 103 07/26/2023   CREATININE 0.78 07/26/2023   BUN 12 07/26/2023   CO2 29 07/26/2023   TSH 2.05 07/26/2023   HGBA1C 5.7 07/26/2023    Lab Results  Component Value Date   TSH 2.05 07/26/2023   Lab Results   Component Value Date   WBC 5.6 07/26/2023   HGB 14.5 07/26/2023   HCT 44.3 07/26/2023   MCV 88.4 07/26/2023   PLT 347.0 07/26/2023   Lab Results  Component Value Date   NA 140 07/26/2023   K 4.8 07/26/2023   CO2 29 07/26/2023   GLUCOSE 88 07/26/2023   BUN 12 07/26/2023   CREATININE 0.78 07/26/2023   BILITOT 0.5 07/26/2023   ALKPHOS 103 07/26/2023   AST 19 07/26/2023   ALT 13 07/26/2023   PROT 7.3 07/26/2023   ALBUMIN 4.4 07/26/2023   CALCIUM 9.5 07/26/2023   GFR 100.99 07/26/2023   Lab Results  Component Value Date   CHOL 190 07/26/2023   Lab Results  Component Value Date   HDL 98.70 07/26/2023   Lab Results  Component Value Date   LDLCALC 85 07/26/2023   Lab Results  Component Value Date   TRIG 36.0 07/26/2023   Lab Results  Component Value Date   CHOLHDL 2 07/26/2023   Lab Results  Component Value Date   HGBA1C 5.7 07/26/2023       Assessment & Plan:  Primary hypertension Assessment & Plan: Well controlled, no changes to meds. Encouraged heart healthy diet such as the DASH diet and exercise as tolerated.    Orders: -     CBC with Differential/Platelet; Future -     Comprehensive metabolic panel; Future -     Lipid panel; Future -     TSH; Future  Preventative health care Assessment & Plan: Patient encouraged to maintain heart healthy diet, regular exercise, adequate sleep. Consider daily probiotics. Take medications as prescribed. Labs ordered and reviewed. Given and reviewed copy of ACP documents from [facility] Secretary of State and encouraged to complete and return  Pap 04/2023 repeat every 3-5 years    Hyperglycemia -     Lipid panel; Future -     Hemoglobin A1c; Future  Chronic right shoulder pain Assessment & Plan: Injured it in August just lifting a purse and was more severe   Orders: -     Ambulatory referral to Sports Medicine  FH: ovarian cancer -     Ambulatory referral to Genetics  FH: prostate cancer -     Ambulatory  referral to Genetics    Assessment and Plan    Menstrual Pain Irregular cycles (24-33 days) with worsening dysmenorrhea since discontinuing hormonal birth control due to liver tumors. Recently started Tranexamic acid. -Continue Tranexamic acid as prescribed by OB/GYN. -Consider use of Ibuprofen 400mg  every 4-6 hours with food during menstruation, starting as early as possible in the cycle. -If Tranexamic acid is ineffective or not tolerated, contact OB/GYN for further evaluation and management.  Right Shoulder Pain Chronic pain since August, exacerbated during menstruation. Pain radiates to upper arm and occasionally to elbow. -Referral to Sports Medicine for further evaluation and management. -Consider use of Tylenol, topical analgesics (Aspercreme, Lidocaine), or CBD cream for pain relief.  Hair Loss Diffuse thinning noted. Recently started multivitamin with minerals and collagen booster. -Ensure multivitamin contains  minerals, particularly selenium and zinc. -Consider adding fish oil supplement. -Increase hydration to 10 ounces of water every 1-2 hours. -Continue with whole foods diet, avoiding ultra-processed foods. -Order basic blood work to check for anemia, thyroid, kidney function, and blood sugar levels. -If hair loss escalates, consider referral to Dermatology.  Family History of Cancer Maternal grandmother with ovarian cancer in her 23s, paternal grandfather with prostate cancer in his late 60s/early 43s. referral for genetic counseling and potential participation in a system-wide genetics study.  Follow-up in 6 months and schedule annual physical exam.         Danise Edge, MD

## 2023-07-26 NOTE — Assessment & Plan Note (Signed)
Injured it in August just lifting a purse and was more severe

## 2023-07-27 ENCOUNTER — Telehealth: Payer: Self-pay | Admitting: Genetic Counselor

## 2023-07-27 NOTE — Telephone Encounter (Signed)
Scheduled appointments per 2/4 scheduling message. Patient is aware of the made appointments and will be mailed an appointment reminder.Marland Kitchen

## 2023-07-28 NOTE — Progress Notes (Signed)
 Ben Mylen Mangan D.[facility] [address] [place] [address] Phone: [phone]   Assessment and Plan:     1. Chronic right shoulder pain (Primary) -Chronic with exacerbation, initial sports medicine visit - Most consistent with right rotator cuff strain, primarily external rotators based on HPI and physical exam - X-ray obtained in clinic.  My interpretation: No acute fracture or dislocation. - Start HEP for rotator cuff - Start meloxicam  15 mg daily x2 weeks.  If still having pain after 2 weeks, complete 3rd-week of NSAID. May use remaining NSAID as needed once daily for pain control.  Do not to use additional over-the-counter NSAIDs (ibuprofen, naproxen, Advil, Aleve) while taking prescription NSAIDs.  May use Tylenol [phone] mg 2 to 3 times a day for breakthrough pain.   15 additional minutes spent for educating Therapeutic Home Exercise Program.  This included exercises focusing on stretching, strengthening, with focus on eccentric aspects.   Hallahan term goals include an improvement in range of motion, strength, endurance as well as avoiding reinjury. Patient's frequency would include in 1-2 times a day, 3-5 times a week for a duration of 6-12 weeks. Proper technique shown and discussed handout in great detail with ATC.  All questions were discussed and answered.     Pertinent previous records reviewed include family medicine note 07/26/2023  Follow Up: 4 weeks for reevaluation.  Could consider physical therapy versus [facility]SI   Subjective:   I, Moenique Parris, am serving as a neurosurgeon for Doctor Morene Mace  Chief Complaint: right shoulder pain   HPI:   07/29/2023 Patient is a 33 year old female with right shoulder pain. Patient states started in August. Grabbed something across the table and it popped. 2 weeks after it started couldn't do anything with that arm. Radiates to the elbow from time to time. Worse during period. Only taking ibu during  period when are hurts worse. No numbeness or tingling just an aching feeling all the way down to the bone. No trouble with grip strength.   Relevant Historical Information: Hypertension, GERD  Additional pertinent review of systems negative.   Current Outpatient Medications:    Drospirenone  (SLYND ) 4 MG TABS, Take 1 tablet (4 mg total) by mouth daily., Disp: 84 tablet, Rfl: 3   LORazepam  (ATIVAN ) 0.5 MG tablet, Take 1 tablet by mouth twice daily as needed for anxiety, Disp: 30 tablet, Rfl: 0   Multiple Vitamin (MULTIVITAMIN) tablet, Take 1 tablet by mouth daily as needed., Disp: , Rfl:    tranexamic acid  (LYSTEDA ) 650 MG TABS tablet, Take 2 tablets (1,300 mg total) by mouth 3 (three) times daily. Take during menses for a maximum of five days, Disp: 30 tablet, Rfl: 2   zolpidem  (AMBIEN ) 5 MG tablet, Take 1 tablet (5 mg total) by mouth at bedtime as needed for sleep., Disp: 30 tablet, Rfl: 1   Objective:     Vitals:   07/29/23 0846  BP: 120/60  Pulse: 80  SpO2: 97%  Weight: 171 lb (77.6 kg)  Height: 5' 6 (1.676 m)      Body mass index is 27.6 kg/m.    Physical Exam:    Gen: Appears well, nad, nontoxic and pleasant Neuro:sensation intact, strength is 5/5 with df/pf/inv/ev, muscle tone wnl Skin: no suspicious lesion or defmority Psych: A&O, appropriate mood and affect  Right shoulder:  No deformity, swelling or muscle wasting No scapular winging FF 180, abd 180, int 0 with pain at endrange, ext 90  NTTP over the [place], clavicle, ac, coracoid, biceps groove, humerus, deltoid, trapezius, cervical spine Positive subscap lift off  neg neer, hawkins, empty can, obriens, crossarm,  speeds Neg ant drawer, sulcus sign, apprehension Negative Spurling's test bilat FROM of neck    Electronically signed by:  Odis Mace D.[facility] 9:04 AM 07/29/23

## 2023-07-29 ENCOUNTER — Ambulatory Visit: Payer: Medicaid Other

## 2023-07-29 ENCOUNTER — Ambulatory Visit: Payer: Medicaid Other | Admitting: Sports Medicine

## 2023-07-29 VITALS — BP 120/60 | HR 80 | Ht 66.0 in | Wt 171.0 lb

## 2023-07-29 DIAGNOSIS — M25511 Pain in right shoulder: Secondary | ICD-10-CM | POA: Diagnosis not present

## 2023-07-29 DIAGNOSIS — S46011A Strain of muscle(s) and tendon(s) of the rotator cuff of right shoulder, initial encounter: Secondary | ICD-10-CM

## 2023-07-29 DIAGNOSIS — G8929 Other chronic pain: Secondary | ICD-10-CM | POA: Diagnosis not present

## 2023-07-29 MED ORDER — MELOXICAM 15 MG PO TABS: 15.0000 mg | ORAL_TABLET | Freq: Every day | ORAL | 0 refills | Status: AC

## 2023-07-29 NOTE — Patient Instructions (Addendum)
-   Start meloxicam  15 mg daily x2 weeks.  If still having pain after 2 weeks, complete 3rd-week of NSAID. May use remaining NSAID as needed once daily for pain control.  Do not to use additional over-the-counter NSAIDs (ibuprofen, naproxen, Advil, Aleve) while taking prescription NSAIDs.  May use Tylenol [phone] mg 2 to 3 times a day for breakthrough pain.  Shoulder HEP. Follow up in 4 weeks.

## 2023-07-30 ENCOUNTER — Encounter: Payer: Self-pay | Admitting: Sports Medicine

## 2023-08-30 NOTE — Progress Notes (Unsigned)
    Beverly Pearson D.[facility] [address] [place] [address] Phone: [phone]   Assessment and Plan:     There are no diagnoses linked to this encounter.  ***   Pertinent previous records reviewed include ***    Follow Up: ***     Subjective:   I, Beverly Pearson, am serving as a Neurosurgeon for Doctor Richardean Sale   Chief Complaint: right shoulder pain    HPI:    07/29/2023 Patient is a 33 year old female with right shoulder pain. Patient states started in August. Grabbed something across the table and it popped. 2 weeks after it started couldn't do anything with that arm. Radiates to the elbow from time to time. Worse during period. Only taking ibu during period when are hurts worse. No numbeness or tingling just an aching feeling all the way down to the bone. No trouble with grip strength.   08/31/2023 Patient states  Relevant Historical Information: Hypertension, GERD  Additional pertinent review of systems negative.   Current Outpatient Medications:    Drospirenone (SLYND) 4 MG TABS, Take 1 tablet (4 mg total) by mouth daily., Disp: 84 tablet, Rfl: 3   LORazepam (ATIVAN) 0.5 MG tablet, Take 1 tablet by mouth twice daily as needed for anxiety, Disp: 30 tablet, Rfl: 0   meloxicam (MOBIC) 15 MG tablet, Take 1 tablet (15 mg total) by mouth daily., Disp: 30 tablet, Rfl: 0   Multiple Vitamin (MULTIVITAMIN) tablet, Take 1 tablet by mouth daily as needed., Disp: , Rfl:    tranexamic acid (LYSTEDA) 650 MG TABS tablet, Take 2 tablets (1,300 mg total) by mouth 3 (three) times daily. Take during menses for a maximum of five days, Disp: 30 tablet, Rfl: 2   zolpidem (AMBIEN) 5 MG tablet, Take 1 tablet (5 mg total) by mouth at bedtime as needed for sleep., Disp: 30 tablet, Rfl: 1   Objective:     There were no vitals filed for this visit.    There is no height or weight on file to calculate BMI.    Physical Exam:     ***   Electronically signed by:  Beverly Pearson D.[facility] 3:45 PM 08/30/23

## 2023-08-31 ENCOUNTER — Ambulatory Visit: Payer: Medicaid Other | Admitting: Sports Medicine

## 2023-08-31 VITALS — Ht 66.0 in | Wt 172.0 lb

## 2023-08-31 DIAGNOSIS — M25511 Pain in right shoulder: Secondary | ICD-10-CM

## 2023-08-31 DIAGNOSIS — S46011A Strain of muscle(s) and tendon(s) of the rotator cuff of right shoulder, initial encounter: Secondary | ICD-10-CM | POA: Diagnosis not present

## 2023-08-31 DIAGNOSIS — G8929 Other chronic pain: Secondary | ICD-10-CM | POA: Diagnosis not present

## 2023-09-20 NOTE — Progress Notes (Unsigned)
    Beverly Pearson D.[facility] [address] [place] [address] Phone: [phone]   Assessment and Plan:     There are no diagnoses linked to this encounter.  ***   Pertinent previous records reviewed include ***    Follow Up: ***     Subjective:   I,  , am serving as a Neurosurgeon for Doctor Richardean Sale   Chief Complaint: right shoulder pain    HPI:    07/29/2023 Patient is a 33 year old female with right shoulder pain. Patient states started in August. Grabbed something across the table and it popped. 2 weeks after it started couldn't do anything with that arm. Radiates to the elbow from time to time. Worse during period. Only taking ibu during period when are hurts worse. No numbeness or tingling just an aching feeling all the way down to the bone. No trouble with grip strength.    08/31/2023 Patient states still has pain but not as intense meloxicam helped   09/21/2023 Patient states   Relevant Historical Information: Hypertension, GERD  Additional pertinent review of systems negative.   Current Outpatient Medications:    Drospirenone (SLYND) 4 MG TABS, Take 1 tablet (4 mg total) by mouth daily., Disp: 84 tablet, Rfl: 3   LORazepam (ATIVAN) 0.5 MG tablet, Take 1 tablet by mouth twice daily as needed for anxiety, Disp: 30 tablet, Rfl: 0   meloxicam (MOBIC) 15 MG tablet, Take 1 tablet (15 mg total) by mouth daily., Disp: 30 tablet, Rfl: 0   Multiple Vitamin (MULTIVITAMIN) tablet, Take 1 tablet by mouth daily as needed., Disp: , Rfl:    tranexamic acid (LYSTEDA) 650 MG TABS tablet, Take 2 tablets (1,300 mg total) by mouth 3 (three) times daily. Take during menses for a maximum of five days, Disp: 30 tablet, Rfl: 2   zolpidem (AMBIEN) 5 MG tablet, Take 1 tablet (5 mg total) by mouth at bedtime as needed for sleep., Disp: 30 tablet, Rfl: 1   Objective:     There were no vitals filed for this visit.    There is no height  or weight on file to calculate BMI.    Physical Exam:    ***   Electronically signed by:  Beverly Pearson [facility] 7:30 AM 09/20/23

## 2023-09-21 ENCOUNTER — Ambulatory Visit (INDEPENDENT_AMBULATORY_CARE_PROVIDER_SITE_OTHER): Admitting: Sports Medicine

## 2023-09-21 VITALS — BP 110/80 | HR 78 | Ht 66.0 in | Wt 172.0 lb

## 2023-09-21 DIAGNOSIS — G8929 Other chronic pain: Secondary | ICD-10-CM | POA: Diagnosis not present

## 2023-09-21 DIAGNOSIS — S46011D Strain of muscle(s) and tendon(s) of the rotator cuff of right shoulder, subsequent encounter: Secondary | ICD-10-CM | POA: Diagnosis not present

## 2023-09-21 DIAGNOSIS — M25511 Pain in right shoulder: Secondary | ICD-10-CM | POA: Diagnosis not present

## 2023-09-21 NOTE — Patient Instructions (Addendum)
 MRI of R shoulder to [facility]  Continue Hep. Follow up 5 days after MRI.

## 2023-09-21 NOTE — Addendum Note (Signed)
 Addended by: Richardean Sale on: 09/21/2023 09:21 AM   Modules accepted: Orders

## 2023-09-27 ENCOUNTER — Inpatient Hospital Stay: Payer: Medicaid Other

## 2023-09-27 ENCOUNTER — Encounter: Payer: Self-pay | Admitting: Genetic Counselor

## 2023-09-27 ENCOUNTER — Inpatient Hospital Stay: Payer: Medicaid Other | Attending: Nurse Practitioner | Admitting: Genetic Counselor

## 2023-09-27 DIAGNOSIS — Z8041 Family history of malignant neoplasm of ovary: Secondary | ICD-10-CM | POA: Diagnosis not present

## 2023-09-27 DIAGNOSIS — Z1379 Encounter for other screening for genetic and chromosomal anomalies: Secondary | ICD-10-CM | POA: Diagnosis not present

## 2023-09-27 DIAGNOSIS — Z8042 Family history of malignant neoplasm of prostate: Secondary | ICD-10-CM | POA: Insufficient documentation

## 2023-09-27 NOTE — Progress Notes (Signed)
 REFERRING PROVIDER: Bradd Canary, MD [address] [place],  [place] [address]  PRIMARY PROVIDER:  Bradd Canary, MD  PRIMARY REASON FOR VISIT:  1. Family history of malignant neoplasm of ovary   2. Family history of malignant neoplasm of prostate      HISTORY OF PRESENT ILLNESS:   Beverly Pearson, a 33 y.o. female, was seen for a [facility] cancer genetics consultation at the request of Dr. Abner Greenspan due to a family history of cancer.  Beverly Pearson presents to clinic today to discuss the possibility of a hereditary predisposition to cancer, genetic testing, and to further clarify her future cancer risks, as well as potential cancer risks for family members.   Beverly Pearson is a 33 y.o. female with no personal history of cancer.   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 It was recommended to stop oral contraceptives and lesions have decreased in size since stopping. 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   RISK FACTORS:  Menarche was at age 61.  Nulliparous.  OCP use for approximately  17  years.  Ovaries intact: yes.  Hysterectomy: no.  Menopausal status: premenopausal.  HRT use: 0 years. Colonoscopy: no; not examined. Mammogram within the last year: no. Number of breast biopsies: 0. Up to date with pelvic exams: yes. Any excessive radiation exposure in the past: no  Past Medical History:  Diagnosis Date   Anxiety 02/17/2011   Constipation    Depression with anxiety 03/03/2011   GERD (gastroesophageal reflux disease)    Herpes simplex without mention of complication    Insomnia 09/01/2011   Overweight 04/21/2015   Reflux 02/17/2011   Reflux 03/03/2011   Reflux 09/16/2011   Tobacco use 12/25/2016    Past Surgical History:  Procedure Laterality Date   TONSILLECTOMY AND ADENOIDECTOMY     age 80   WISDOM TOOTH EXTRACTION      Social History   Socioeconomic History   Marital status: Single    Spouse name: Not on file    Number of children: 0   Years of education: Not on file   Highest education level: Associate degree: academic program  Occupational History   Occupation: Unemployed  Tobacco Use   Smoking status: Some Days    Current packs/day: 0.00    Types: Cigarettes    Last attempt to quit: 12/04/2020    Years since quitting: 2.8   Smokeless tobacco: Never  Vaping Use   Vaping status: Never Used  Substance and Sexual Activity   Alcohol use: Yes    Comment: socially   Drug use: Not Currently    Types: Marijuana   Sexual activity: Never    Birth control/protection: Pill  Other Topics Concern   Not on file  Social History Narrative   Occasional soda    Social Drivers of Health   Financial Resource Strain: Medium Risk (10/01/2022)   Overall Financial Resource Strain (CARDIA)    Difficulty of Paying Living Expenses: Somewhat hard  Food Insecurity: No Food Insecurity (10/01/2022)   Hunger Vital Sign    Worried About Running Out of Food in the Last Year: Never true    Ran Out of Food in the Last Year: Never true  Transportation Needs: No Transportation Needs (10/01/2022)   PRAPARE - Administrator, Civil Service (Medical): No    Lack of Transportation (Non-Medical): No  Physical Activity: Sufficiently Active (10/01/2022)   Exercise Vital Sign  Days of Exercise per Week: 3 days    Minutes of Exercise per Session: 120 min  Stress: Stress Concern Present (10/01/2022)   [employer] of Occupational Health - Occupational Stress Questionnaire    Feeling of Stress : Very much  Social Connections: Socially Isolated (10/01/2022)   Social Connection and Isolation Panel [NHANES]    Frequency of Communication with Friends and Family: Three times a week    Frequency of Social Gatherings with Friends and Family: Once a week    Attends Religious Services: Never    Database administrator or Organizations: No    Attends Engineer, structural: Not on file    Marital Status: Never  married     FAMILY HISTORY:  We obtained a detailed, 4-generation family history.  Significant diagnoses are listed below: Family History  Problem Relation Age of Onset   Diabetes Mother        type 2   Hypertension Mother    Diabetes Maternal Grandmother        type 2   Cancer Maternal Grandmother 92       ovarian   Hypertension Maternal Grandmother    Kidney failure Maternal Grandfather    Hypertension Maternal Grandfather    Kidney disease Maternal Grandfather    Diabetes Paternal Grandmother        type 2   Hypertension Paternal Grandmother    Emphysema Paternal Grandfather        smoke   COPD Paternal Grandfather    Cancer Paternal Grandfather 83       prostate cancer, metastatic   Colon cancer Neg Hx     Ms. Thackeray reports her mom may have had normal BRCA1/2 testing over 13 years ago, but otherwise is unaware of previous family history of genetic testing for hereditary cancer risks. There is no reported Ashkenazi Jewish ancestry.   She reports her maternal grandmother was diagnosed with ovarian cancer at age 40, passed away at age 22. She reports her paternal grandfather was diagnosed with metastatic prostate cancer in his late 75s and passed away around age 40. She reports a paternal great-aunt (grandma's sister) diagnosed with breast cancer in her late 39s or early 77s, deceased. She does not report any additional family history of tumors and/or cancer.     GENETIC COUNSELING ASSESSMENT: [name] is a 33 y.o. female with a family history of cancer which is suggestive of an inherited predisposition to cancer. We, therefore, discussed and recommended the following at today's visit.   DISCUSSION: We discussed that, in general, most cancer is not inherited in families, but instead is sporadic or familial. Sporadic cancers occur by chance and typically happen at older ages (>50 years) as this type of cancer is caused by genetic changes acquired during an individual's lifetime.  Some families have more cancers than would be expected by chance; however, the ages or types of cancer are not consistent with a known genetic mutation or known genetic mutations have been ruled out. This type of familial cancer is thought to be due to a combination of multiple genetic, environmental, hormonal, and lifestyle factors. While this combination of factors likely increases the risk of cancer, the exact source of this risk is not currently identifiable or testable.  We discussed that 5 - 10% of cancer is hereditary. We discussed that testing is beneficial for several reasons including knowing how to screen individuals for cancer, identify preventative medications and surgery, and to understand if other family members  could be at risk for cancer and allow them to undergo genetic testing.   We reviewed the characteristics, features and inheritance patterns of hereditary cancer syndromes. We also discussed genetic testing, including the appropriate family members to test, the process of testing, insurance coverage and turn-around-time for results. We discussed the implications of a negative, positive, carrier and/or variant of uncertain significant result. Ms. Kernen  was offered a common hereditary cancer panel (36+ genes) and an expanded pan-cancer panel (70+ genes). Ms. Romaniello was informed of the benefits and limitations of each panel, including that expanded pan-cancer panels contain genes that do not have clear management guidelines at this point in time.  We also discussed that as the number of genes included on a panel increases, the chances of variants of uncertain significance increases. Ms. Pollitt expressed interest in [facility]'s 108 gene CancerNext +RNAinsight panel. The [facility] CancerNext+RNAinsight Panel includes sequencing, rearrangement analysis, and RNA analysis for the following 39 genes: APC, ATM, BAP1, BARD1, BMPR1A, BRCA1, BRCA2, BRIP1, CDH1, CDKN2A, CHEK2, FH, FLCN, MET, MLH1, MSH2, MSH6, MUTYH,  NF1, NTHL1, PALB2, PMS2, PTEN, RAD51C, RAD51D, SMAD4, STK11, TP53, TSC1, TSC2, and VHL (sequencing and deletion/duplication); AXIN2, HOXB13, MBD4, MSH3, POLD1 and POLE (sequencing only); EPCAM and GREM1 (deletion/duplication only).   Based on Ms. Smits's family history of cancer, she meets medical criteria for genetic testing. Though Ms. Parisien is not personally affected, there are no affected family members that are willing/able/available to undergo hereditary cancer testing.  Therefore, Ms. Janowski the most informative family member available.  Despite that she meets criteria, she may still have an out of pocket cost. We discussed that if her out of pocket cost for testing is over $100, the laboratory will call and confirm whether she wants to proceed with testing.  If the out of pocket cost of testing is less than $100 she will be billed by the genetic testing laboratory.   We discussed that some people do not want to undergo genetic testing due to fear of genetic discrimination.  The Genetic Information Nondiscrimination Act (GINA) was signed into federal law in 2008. GINA prohibits health insurers and most employers from discriminating against individuals based on genetic information (including the results of genetic tests and family history information). According to GINA, health insurance companies cannot consider genetic information to be a preexisting condition, nor can they use it to make decisions regarding coverage or rates. GINA also makes it illegal for most employers to use genetic information in making decisions about hiring, firing, promotion, or terms of employment. It is important to note that GINA does not offer protections for life insurance, disability insurance, or [place]-term care insurance. GINA does not apply to those in the [employer], those who work for companies with less than 15 employees, and new life insurance or [place]-term disability insurance policies.  Health status due to a cancer  diagnosis is not protected under GINA. More information about GINA can be found by visiting [email or web address].  The Tyrer-Cuzick model is one of multiple prediction models developed to estimate an individual's lifetime risk of developing breast cancer. The Tyrer-Cuzick model is endorsed by the [employer] (NCCN). This model includes many risk factors such as family history, endogenous estrogen exposure, and benign breast disease. The calculation is highly-dependent on the accuracy of clinical data provided by the patient and can change over time. The Tyrer-Cuzick model may be repeated to reflect new information in her personal or family history in the future.   Based on the  patient's family history, a statistical model Air cabin crew) was used to estimate her risk of developing breast cancer. This estimates her lifetime risk of developing breast cancer to be approximately 14.2%. This estimation does not consider any genetic testing results.  The patient's lifetime breast cancer risk is a preliminary estimate based on available information using one of several models endorsed by the American Cancer Society (ACS). The ACS recommends consideration of breast MRI screening as an adjunct to mammography for patients at high risk (defined as 20% or greater lifetime risk). Please note that a woman's breast cancer risk changes over time. It may increase or decrease based on age and any changes to the personal and/or family medical history. The risks and recommendations listed above apply to this patient at this point in time. In the future, she may or may not be eligible for the same medical management strategies and, in some cases, other medical management strategies may become available to her. If she is interested in an updated breast cancer risk assessment at a later date, she can contact us.    PLAN: After considering the risks, benefits, and limitations, Ms. Yarborough declined to complete  testing today, sharing that she wanted to time to consider the option to test. We understand this decision and remain available to coordinate genetic testing at any time in the future. We, therefore, recommend Ms. Seeman continue to follow the cancer screening guidelines given by her primary healthcare provider.  Based on Ms. Bristol's family history, we recommended genetic testing for her father given the paternal history of metastatic prostate cancer and update testing for her mother, given the maternal history of ovarian cancer. Ms. Moening will let us know if we can be of any assistance in coordinating genetic counseling and/or testing for this family member.   Lastly, we encouraged Ms. Winberg to remain in contact with cancer genetics annually so that we can continuously update the family history and inform her of any changes in cancer genetics and testing that may be of benefit for this family.   Ms. Mckissack questions were answered to her satisfaction today. Our contact information was provided should additional questions or concerns arise. Thank you for the referral and allowing [place] to share in the care of your patient.   Vassie Moment, MS, [facility] Licensed, [email or web address] phone: [phone]  45 minutes were spent on the date of the encounter in service to the patient including preparation, face-to-face consultation, documentation and care coordination.  The patient was seen alone.  Drs. Meliton Rattan, and/or Gonzales were available for questions, if needed..    _______________________________________________________________________ For Office Staff:  Number of people involved in session: 1 Was an Intern/ student involved with case: no

## 2023-10-05 ENCOUNTER — Ambulatory Visit

## 2023-10-05 ENCOUNTER — Ambulatory Visit (INDEPENDENT_AMBULATORY_CARE_PROVIDER_SITE_OTHER)

## 2023-10-05 ENCOUNTER — Ambulatory Visit (INDEPENDENT_AMBULATORY_CARE_PROVIDER_SITE_OTHER): Admitting: Sports Medicine

## 2023-10-05 DIAGNOSIS — M25511 Pain in right shoulder: Secondary | ICD-10-CM | POA: Diagnosis not present

## 2023-10-05 DIAGNOSIS — G8929 Other chronic pain: Secondary | ICD-10-CM | POA: Diagnosis not present

## 2023-10-05 DIAGNOSIS — S46011D Strain of muscle(s) and tendon(s) of the rotator cuff of right shoulder, subsequent encounter: Secondary | ICD-10-CM

## 2023-10-05 MED ORDER — TRIAMCINOLONE ACETONIDE 40 MG/ML IJ SUSP
40.0000 mg | Freq: Once | INTRAMUSCULAR | Status: AC
  Administered 2023-10-05: 40 mg via INTRAMUSCULAR

## 2023-10-05 NOTE — Progress Notes (Signed)
    Procedures performed today:    Procedure: Real-time Ultrasound Guided gadolinium contrast injection of right glenohumeral joint Device: Samsung HS60  Verbal informed consent obtained.  Time-out conducted.  Noted no overlying erythema, induration, or other signs of local infection.  Skin prepped in a sterile fashion.  Local anesthesia: Topical Ethyl chloride.  With sterile technique and under real time ultrasound guidance: 22-gauge spinal needle advanced into the glenohumeral joint from a posterior approach, I injected 1 cc kenalog 40, 2 cc lidocaine, 2 cc bupivacaine, syringe switched and 0.1 cc gadolinium injected, syringe switched and 10 cc sterile saline used to fully stem joint. Joint visualized and capsule seen distending confirming intra-articular placement of contrast material and medication. Completed without difficulty  Advised to call if fevers/chills, erythema, induration, drainage, or persistent bleeding.  Images permanently stored in PACS Impression: Technically successful ultrasound guided gadolinium contrast injection for MR arthrography.  Please see separate MR arthrogram report.  Independent interpretation of notes and tests performed by another provider:   None.  Brief History, Exam, Impression, and Recommendations:    Chronic right shoulder pain Injection performed today for MR arthrography.    ____________________________________________ Joselyn Nicely. Sandy Crumb, M.D., ABFM., CAQSM., AME. Primary Care and Sports Medicine [facility]  Adjunct Professor of [facility]  [employer]  Restaurant manager, fast food

## 2023-10-05 NOTE — Assessment & Plan Note (Signed)
Injection performed today for MR arthrography.

## 2023-10-12 NOTE — Progress Notes (Unsigned)
    [name] D.[facility] [address] [place] [address] Phone: [phone]   Assessment and Plan:     There are no diagnoses linked to this encounter.  ***   Pertinent previous records reviewed include ***    Follow Up: ***     Subjective:   I, Beverly Pearson, am serving as a Neurosurgeon for Doctor Ulysees Gander   Chief Complaint: right shoulder pain    HPI:    07/29/2023 Patient is a 33 year old female with right shoulder pain. Patient states started in August. Grabbed something across the table and it popped. 2 weeks after it started couldn't do anything with that arm. Radiates to the elbow from time to time. Worse during period. Only taking ibu during period when are hurts worse. No numbeness or tingling just an aching feeling all the way down to the bone. No trouble with grip strength.    08/31/2023 Patient states still has pain but not as intense meloxicam  helped    09/21/2023 Patient states it is hurting a little bit today. Yesterday took meloxicam  because it was hurting. Thought that the steroid shot would knock everything out but it still hurts and still radiates.   10/13/2023 Patient states   Relevant Historical Information: Hypertension, GERD  Additional pertinent review of systems negative.   Current Outpatient Medications:    Drospirenone  (SLYND ) 4 MG TABS, Take 1 tablet (4 mg total) by mouth daily., Disp: 84 tablet, Rfl: 3   LORazepam  (ATIVAN ) 0.5 MG tablet, Take 1 tablet by mouth twice daily as needed for anxiety, Disp: 30 tablet, Rfl: 0   meloxicam  (MOBIC ) 15 MG tablet, Take 1 tablet (15 mg total) by mouth daily., Disp: 30 tablet, Rfl: 0   Multiple Vitamin (MULTIVITAMIN) tablet, Take 1 tablet by mouth daily as needed., Disp: , Rfl:    tranexamic acid  (LYSTEDA ) 650 MG TABS tablet, Take 2 tablets (1,300 mg total) by mouth 3 (three) times daily. Take during menses for a maximum of five days, Disp: 30 tablet, Rfl: 2    zolpidem  (AMBIEN ) 5 MG tablet, Take 1 tablet (5 mg total) by mouth at bedtime as needed for sleep., Disp: 30 tablet, Rfl: 1   Objective:     There were no vitals filed for this visit.    There is no height or weight on file to calculate BMI.    Physical Exam:    ***   Electronically signed by:  Marshall Skeeter D.[facility] 7:51 AM 10/12/23

## 2023-10-13 ENCOUNTER — Encounter: Payer: Self-pay | Admitting: Sports Medicine

## 2023-10-13 ENCOUNTER — Ambulatory Visit: Admitting: Sports Medicine

## 2023-10-13 ENCOUNTER — Other Ambulatory Visit (INDEPENDENT_AMBULATORY_CARE_PROVIDER_SITE_OTHER)

## 2023-10-13 ENCOUNTER — Ambulatory Visit (INDEPENDENT_AMBULATORY_CARE_PROVIDER_SITE_OTHER): Payer: Medicaid Other | Admitting: Gastroenterology

## 2023-10-13 ENCOUNTER — Ambulatory Visit (INDEPENDENT_AMBULATORY_CARE_PROVIDER_SITE_OTHER): Admitting: Sports Medicine

## 2023-10-13 ENCOUNTER — Encounter: Payer: Self-pay | Admitting: Gastroenterology

## 2023-10-13 VITALS — BP 122/88 | HR 100 | Ht 66.0 in | Wt 173.2 lb

## 2023-10-13 VITALS — BP 132/82 | Ht 66.0 in | Wt 172.0 lb

## 2023-10-13 DIAGNOSIS — G8929 Other chronic pain: Secondary | ICD-10-CM

## 2023-10-13 DIAGNOSIS — M25511 Pain in right shoulder: Secondary | ICD-10-CM | POA: Diagnosis not present

## 2023-10-13 DIAGNOSIS — R748 Abnormal levels of other serum enzymes: Secondary | ICD-10-CM | POA: Diagnosis not present

## 2023-10-13 DIAGNOSIS — S46011D Strain of muscle(s) and tendon(s) of the rotator cuff of right shoulder, subsequent encounter: Secondary | ICD-10-CM

## 2023-10-13 DIAGNOSIS — K8020 Calculus of gallbladder without cholecystitis without obstruction: Secondary | ICD-10-CM | POA: Diagnosis not present

## 2023-10-13 DIAGNOSIS — K769 Liver disease, unspecified: Secondary | ICD-10-CM

## 2023-10-13 DIAGNOSIS — R1013 Epigastric pain: Secondary | ICD-10-CM | POA: Diagnosis not present

## 2023-10-13 LAB — CBC
HCT: 43.9 % (ref 36.0–46.0)
Hemoglobin: 14.5 g/dL (ref 12.0–15.0)
MCHC: 33 g/dL (ref 30.0–36.0)
MCV: 88.2 fl (ref 78.0–100.0)
Platelets: 359 10*3/uL (ref 150.0–400.0)
RBC: 4.98 Mil/uL (ref 3.87–5.11)
RDW: 14.5 % (ref 11.5–15.5)
WBC: 9.1 10*3/uL (ref 4.0–10.5)

## 2023-10-13 LAB — COMPREHENSIVE METABOLIC PANEL WITH GFR
ALT: 30 U/L (ref 0–35)
AST: 23 U/L (ref 0–37)
Albumin: 4.1 g/dL (ref 3.5–5.2)
Alkaline Phosphatase: 103 U/L (ref 39–117)
BUN: 15 mg/dL (ref 6–23)
CO2: 31 meq/L (ref 19–32)
Calcium: 8.9 mg/dL (ref 8.4–10.5)
Chloride: 100 meq/L (ref 96–112)
Creatinine, Ser: 0.79 mg/dL (ref 0.40–1.20)
GFR: 99.31 mL/min (ref >60.00)
Glucose, Bld: 96 mg/dL (ref 70–99)
Potassium: 4.1 meq/L (ref 3.5–5.1)
Sodium: 136 meq/L (ref 135–145)
Total Bilirubin: 0.4 mg/dL (ref 0.2–1.2)
Total Protein: 7.4 g/dL (ref 6.0–8.3)

## 2023-10-13 LAB — GAMMA GT: GGT: 40 U/L (ref 7–51)

## 2023-10-13 NOTE — Patient Instructions (Signed)
 _______________________________________________________  If your blood pressure at your visit was 140/90 or greater, please contact your primary care physician to follow up on this.  If you are age 32 or younger, your body mass index should be between 19-25. Your Body mass index is 27.96 kg/m. If this is out of the aformentioned range listed, please consider follow up with your Primary Care Provider.  ________________________________________________________  The [facility] providers would like to encourage you to use MYCHART to communicate with providers for non-urgent requests or questions.  Due to [place] hold times on the telephone, sending your provider a message by [facility] may be a faster and more efficient way to get a response.  Please allow 48 business hours for a response.  Please remember that this is for non-urgent requests.  _______________________________________________________ Please avoid all NSAID's. Some examples of NSAID's are as follows: Aspirin (Bufferin, Bayer, and Excedrin) Ibuprofen (Advil, Motrin, Nuprin) Ketoprofen (Actron, Orudis) Naproxen (Aleve) Daypro  Indocin  Lodine  Naprosyn  Relafen  Vimovo Voltaren   Your provider has requested that you go to the basement level for lab work before leaving today. Press "B" on the elevator. The lab is located at the first door on the left as you exit the elevator.  You have been scheduled for an endoscopy. Please follow written instructions given to you at your visit today.  If you use inhalers (even only as needed), please bring them with you on the day of your procedure.  If you take any of the following medications, they will need to be adjusted prior to your procedure:   DO NOT TAKE 7 DAYS PRIOR TO TEST- Trulicity (dulaglutide) Ozempic, Wegovy (semaglutide) Mounjaro (tirzepatide) Bydureon Bcise (exanatide extended release)  DO NOT TAKE 1 DAY PRIOR TO YOUR TEST Rybelsus (semaglutide) Adlyxin  (lixisenatide) Victoza (liraglutide ) Byetta (exanatide) ___________________________________________________________________________  You have been scheduled for an [facility] at [facility] on [facility]. Your appointment time is 9:30am. Please arrive to admitting (at main entrance of the hospital) 30 minutes prior to your appointment time for registration purposes. Please make certain not to have anything to eat or drink 4 hours prior to your test. In addition, if you have any metal in your body, have a pacemaker or defibrillator, please be sure to let your ordering physician know. This test typically takes 45 minutes to 1 hour to complete. Should you need to reschedule, please call [phone] to do so.  Due to recent changes in healthcare laws, you may see the results of your imaging and laboratory studies on MyChart before your provider has had a chance to review them.  We understand that in some cases there may be results that are confusing or concerning to you. Not all laboratory results come back in the same time frame and the provider may be waiting for multiple results in order to interpret others.  Please give us  48 hours in order for your provider to thoroughly review all the results before contacting the office for clarification of your results.   Thank you for entrusting me with your care and choosing [facility].  Dr Venice Gillis

## 2023-10-13 NOTE — Patient Instructions (Signed)
 PT referral  4-6 week follow up

## 2023-10-13 NOTE — Progress Notes (Signed)
 Chief Complaint: FU  Referring Provider:  Neda Balk, MD      ASSESSMENT AND PLAN;   #1. Epi pain  #2. Liver lesions- likely hepatic adenomas.  Decreased in size after stopping OCs  #3. PBC suspect (Abn Alk Phos, borderline + AMA 23.9, elevated IgM. Alk iso with Nl Liver/bone/intestinal fraction-given trial of Urso x 1 month.  Patient stopped on her own.  Seen by Duey [place]  #4. Asymptomatic cholelithiasis.  Plan: -CBC, CMP, AMA, GGT, IgM -FU MRI with eovist  -EGD -Avoid NSAIDs -After EGD, if needed, will start PPIs. - If still with problems, rpt US  followed by surgical consultation.  At this time, I doubt if she has biliary colic.   HPI:    Beverly Pearson is a 33 y.o. female  Stopped OCs Liver lesions have decreased in size on MRI 04/22/2022.  Radiology feels it is consistent with benign hepatic adenomas rather than FNH.  Repeat MRI 06/05/2023 was unfortunately a very limited exam.  It was recommended to repeat MRI.  She had borderline positive AMA.  Mildly elevated IgM.  No pruritus.  Given trial of Urso.  Patient stopped on her own in Nov 2024.  Most recent alkaline phosphatase was normal as below.   History of Present Illness   She has been experiencing upper abdominal pain for about a year. Initially, the pain was associated with her menstrual cycles, but it now occurs independently. The pain can occur upon waking or after eating and is described as persistent throughout the day, without a specific pattern. Fatty foods do not significantly worsen the pain. No nausea, vomiting, or heartburn is present.  She experiences bloating after eating, resulting in a pressure-like pain. She has a history of constipation, which has improved recently. Bowel movements occur at least once a day or every other day, and they do not significantly alleviate the pain.  She has been using ibuprofen for pain management, including for shoulder pain, and has also taken meloxicam  daily  for two weeks, then as needed. These medications have helped alleviate her pain. No itching or heartburn is reported.  No nausea, vomiting, heartburn, regurgitation, odynophagia or dysphagia.  No significant diarrhea or constipation.  No melena or hematochezia. No unintentional weight loss.  No H/O itching, skin lesions, easy bruisability, intake of OTC meds including diet pills, herbal medications, anabolic steroids or Tylenol. There is no H/O blood transfusions, IVDA or FH of liver disease. No jaundice, dark urine or pale stools.   No alcohol abuse but does drink socially.  Wt Readings from Last 3 Encounters:  10/13/23 173 lb 4 oz (78.6 kg)  10/13/23 172 lb (78 kg)  09/21/23 172 lb (78 kg)        Latest Ref Rng & Units 07/26/2023   10:24 AM 01/05/2023   12:03 PM 06/04/2022    3:11 PM  Hepatic Function  Total Protein 6.0 - 8.3 g/dL 7.3  7.1  7.4   Albumin 3.5 - 5.2 g/dL 4.4  4.3  4.5   AST 0 - 37 U/L 19  24  28    ALT 0 - 35 U/L 13  26  37   Alk Phosphatase 39 - 117 U/L 103  130  136   Total Bilirubin 0.2 - 1.2 mg/dL 0.5  0.6  0.4       Previous GI work-up: MRI 05/2023-very limited exam 1. Unfortunately, early arterial phase of contrast enhancement is significantly limited by breath motion artifact and nearly nondiagnostic.  2. Within this limitation, multiple liver lesions are thereby best appreciated on delayed hepatobiliary phase, on which they are hypointense to background parenchyma and significantly diminished in size compared to prior examination. Findings are consistent with continued involution. 3. Cholelithiasis.    MRI 04/22/2022 IMPRESSION: 1. Interval decrease in size of the bilobar hepatic lesions (3.4 cm, 2 cm) which again demonstrate nonspecific but almost certainly benign imaging characteristics. However, taken in context with the interval stoppage of OCPs, per medical record for the last 3-4 months, the imaging findings with interval decrease in size  since cessation of OCPs is highly suggestive of benign hepatic adenomas over FNH. No new lesions identified. 2. Cholelithiasis without cholecystitis.  MRI 09/22/2021 1. Bilobar arterially enhancing hepatic masses(4.2 cm, 2.5 cm) most likely reflect hepatic adenomas or focal nodular hyperplasia. Consider cessation of oral contraceptives, patient is currently on this medication, and follow-up MRI in 6 months with and without EOVIST  contrast to ensure stability and for more definitive characterization. 2. Cholelithiasis without findings of acute cholecystitis.  US  09/08/2021 1.  Cholelithiasis without evidence of acute cholecystitis. 2. Possible mass in the liver measuring up to 6.2 cm. Recommend contrast enhanced liver MRI for further evaluation. Past Medical History:  Diagnosis Date   Anxiety 02/17/2011   Constipation    Depression with anxiety 03/03/2011   GERD (gastroesophageal reflux disease)    Herpes simplex without mention of complication    Insomnia 09/01/2011   Overweight 04/21/2015   Reflux 02/17/2011   Reflux 03/03/2011   Reflux 09/16/2011   Tobacco use 12/25/2016    Past Surgical History:  Procedure Laterality Date   TONSILLECTOMY AND ADENOIDECTOMY     age 77   WISDOM TOOTH EXTRACTION      Family History  Problem Relation Age of Onset   Diabetes Mother        type 2   Hypertension Mother    Diabetes Maternal Grandmother        type 2   Cancer Maternal Grandmother 65       ovarian   Hypertension Maternal Grandmother    Kidney failure Maternal Grandfather    Hypertension Maternal Grandfather    Kidney disease Maternal Grandfather    Diabetes Paternal Grandmother        type 2   Hypertension Paternal Grandmother    Emphysema Paternal Grandfather        smoke   COPD Paternal Grandfather    Cancer Paternal Grandfather 29       prostate cancer, metastatic   Colon cancer Neg Hx     Social History   Tobacco Use   Smoking status: Former    Current  packs/day: 0.00    Types: Cigarettes    Quit date: 12/04/2020    Years since quitting: 2.8   Smokeless tobacco: Never  Vaping Use   Vaping status: Never Used  Substance Use Topics   Alcohol use: Yes    Comment: socially   Drug use: Yes    Types: Marijuana    Current Outpatient Medications  Medication Sig Dispense Refill   LORazepam  (ATIVAN ) 0.5 MG tablet Take 1 tablet by mouth twice daily as needed for anxiety 30 tablet 0   meloxicam  (MOBIC ) 15 MG tablet Take 1 tablet (15 mg total) by mouth daily. 30 tablet 0   Multiple Vitamin (MULTIVITAMIN) tablet Take 1 tablet by mouth daily as needed.     tranexamic acid  (LYSTEDA ) 650 MG TABS tablet Take 2 tablets (1,300 mg total) by mouth 3 (  three) times daily. Take during menses for a maximum of five days 30 tablet 2   zolpidem  (AMBIEN ) 5 MG tablet Take 1 tablet (5 mg total) by mouth at bedtime as needed for sleep. 30 tablet 1   No current facility-administered medications for this visit.    Allergies  Allergen Reactions   Other Other (See Comments)    HOUSE DUST, sneezing and runny nose    Review of Systems:  neg     Physical Exam:    BP 122/88   Pulse 100   Ht 5\' 6"  (1.676 m)   Wt 173 lb 4 oz (78.6 kg)   BMI 27.96 kg/m  Wt Readings from Last 3 Encounters:  10/13/23 173 lb 4 oz (78.6 kg)  10/13/23 172 lb (78 kg)  09/21/23 172 lb (78 kg)   Constitutional:  Well-developed, in no acute distress. Psychiatric: Normal mood and affect. Behavior is normal. HEENT: Pupils normal.  Conjunctivae are normal. No scleral icterus.  Cardiovascular: Normal rate, regular rhythm. No edema Pulmonary/chest: Effort normal and breath sounds normal. No wheezing, rales or rhonchi. Abdominal: Soft, nondistended. Nontender. Bowel sounds active throughout. There are no masses palpable. No hepatomegaly. Rectal: Deferred Neurological: Alert and oriented to person place and time. Skin: Skin is warm and dry. No rashes noted.  Data Reviewed: I have  personally reviewed following labs and imaging studies  CBC:    Latest Ref Rng & Units 07/26/2023   10:24 AM 01/05/2023   12:03 PM 06/04/2022    3:11 PM  CBC  WBC 4.0 - 10.5 K/uL 5.6  8.3  8.9   Hemoglobin 12.0 - 15.0 g/dL 16.1  09.6  04.5   Hematocrit 36.0 - 46.0 % 44.3  43.3  43.1   Platelets 150.0 - 400.0 K/uL 347.0  328.0  426.0     CMP:    Latest Ref Rng & Units 07/26/2023   10:24 AM 01/05/2023   12:03 PM 06/04/2022    3:11 PM  CMP  Glucose 70 - 99 mg/dL 88  409  811   BUN 6 - 23 mg/dL 12  18  12    Creatinine 0.40 - 1.20 mg/dL 9.14  7.82  9.56   Sodium 135 - 145 mEq/L 140  137  135   Potassium 3.5 - 5.1 mEq/L 4.8  4.3  3.7   Chloride 96 - 112 mEq/L 103  104  100   CO2 19 - 32 mEq/L 29  26  27    Calcium 8.4 - 10.5 mg/dL 9.5  9.9  9.6   Total Protein 6.0 - 8.3 g/dL 7.3  7.1  7.4   Total Bilirubin 0.2 - 1.2 mg/dL 0.5  0.6  0.4   Alkaline Phos 39 - 117 U/L 103  130  136   AST 0 - 37 U/L 19  24  28    ALT 0 - 35 U/L 13  26  37        Magnus Schuller, MD 10/13/2023, 9:32 AM  Cc: Neda Balk, MD

## 2023-10-15 ENCOUNTER — Ambulatory Visit (HOSPITAL_COMMUNITY)
Admission: RE | Admit: 2023-10-15 | Discharge: 2023-10-15 | Disposition: A | Discharge status: Home / Self Care | Source: Ambulatory Visit | Attending: Gastroenterology | Admitting: Gastroenterology

## 2023-10-15 DIAGNOSIS — K769 Liver disease, unspecified: Secondary | ICD-10-CM | POA: Insufficient documentation

## 2023-10-15 MED ORDER — GADOXETATE DISODIUM 0.25 MMOL/ML IV SOLN
7.5000 mL | Freq: Once | INTRAVENOUS | Status: AC | PRN
  Administered 2023-10-15: 7.5 mL via INTRAVENOUS

## 2023-10-16 LAB — MITOCHONDRIAL ANTIBODIES: Mitochondrial M2 Ab, IgG: 38.9 U — ABNORMAL HIGH (ref <=20.0)

## 2023-10-16 LAB — IGM: IgM, Serum: 380 mg/dL — ABNORMAL HIGH (ref 50–300)

## 2023-10-17 NOTE — Progress Notes (Signed)
 Please inform the patient. Pt with PBC (+AMA 39, IgM 380) with Nl alk phos now, GGT. No prutitis Wt 78kg, Uros at 13mg /kg  Plan: -Restart URSO 500mg  po BID #180, 4RF. Donot stop -Proceed with FU MRI and EGD as per last note -Routine FU appt with Dawn Dreazek in 6 months -FU GI in 8 months. Send report to family physician

## 2023-10-18 ENCOUNTER — Other Ambulatory Visit: Payer: Self-pay

## 2023-10-18 DIAGNOSIS — K743 Primary biliary cirrhosis: Secondary | ICD-10-CM

## 2023-10-18 MED ORDER — URSODIOL 250 MG PO TABS: 500.0000 mg | ORAL_TABLET | Freq: Two times a day (BID) | ORAL | 4 refills | Status: AC

## 2023-11-09 NOTE — Progress Notes (Deleted)
    Beverly Pearson D.[facility] [address] [place] [address] Phone: [phone]   Assessment and Plan:     There are no diagnoses linked to this encounter.  ***   Pertinent previous records reviewed include ***    Follow Up: ***     Subjective:   I, Beverly Pearson, am serving as a Neurosurgeon for Doctor Ulysees Gander   Chief Complaint: right shoulder pain    HPI:    07/29/2023 Patient is a 33 year old female with right shoulder pain. Patient states started in August. Grabbed something across the table and it popped. 2 weeks after it started couldn't do anything with that arm. Radiates to the elbow from time to time. Worse during period. Only taking ibu during period when are hurts worse. No numbeness or tingling just an aching feeling all the way down to the bone. No trouble with grip strength.    08/31/2023 Patient states still has pain but not as intense meloxicam  helped    09/21/2023 Patient states it is hurting a little bit today. Yesterday took meloxicam  because it was hurting. Thought that the steroid shot would knock everything out but it still hurts and still radiates.    10/13/2023 Patient states she is felling a little better since MRI    11/10/2023 Patient states   Relevant Historical Information: Hypertension, GERD    Additional pertinent review of systems negative.   Current Outpatient Medications:    LORazepam  (ATIVAN ) 0.5 MG tablet, Take 1 tablet by mouth twice daily as needed for anxiety, Disp: 30 tablet, Rfl: 0   meloxicam  (MOBIC ) 15 MG tablet, Take 1 tablet (15 mg total) by mouth daily., Disp: 30 tablet, Rfl: 0   Multiple Vitamin (MULTIVITAMIN) tablet, Take 1 tablet by mouth daily as needed., Disp: , Rfl:    tranexamic acid  (LYSTEDA ) 650 MG TABS tablet, Take 2 tablets (1,300 mg total) by mouth 3 (three) times daily. Take during menses for a maximum of five days, Disp: 30 tablet, Rfl: 2   ursodiol  (URSO ) 250 MG  tablet, Take 2 tablets (500 mg total) by mouth 2 (two) times daily., Disp: 180 tablet, Rfl: 4   zolpidem  (AMBIEN ) 5 MG tablet, Take 1 tablet (5 mg total) by mouth at bedtime as needed for sleep., Disp: 30 tablet, Rfl: 1   Objective:     There were no vitals filed for this visit.    There is no height or weight on file to calculate BMI.    Physical Exam:    ***   Electronically signed by:  Marshall Skeeter D.[facility] 7:37 AM 11/09/23

## 2023-11-10 ENCOUNTER — Ambulatory Visit: Admitting: Sports Medicine

## 2023-11-17 ENCOUNTER — Ambulatory Visit: Admitting: Physical Therapy

## 2023-11-25 ENCOUNTER — Encounter: Payer: Self-pay | Admitting: Student

## 2023-11-25 ENCOUNTER — Other Ambulatory Visit (HOSPITAL_COMMUNITY)
Admission: RE | Admit: 2023-11-25 | Discharge: 2023-11-25 | Disposition: A | Discharge status: Home / Self Care | Source: Ambulatory Visit | Attending: Student | Admitting: Student

## 2023-11-25 ENCOUNTER — Ambulatory Visit: Admitting: Physician Assistant

## 2023-11-25 ENCOUNTER — Ambulatory Visit (INDEPENDENT_AMBULATORY_CARE_PROVIDER_SITE_OTHER): Admitting: Student

## 2023-11-25 VITALS — BP 128/86 | HR 58 | Temp 98.5°F | Resp 12 | Ht 66.0 in | Wt 177.6 lb

## 2023-11-25 DIAGNOSIS — Z113 Encounter for screening for infections with a predominantly sexual mode of transmission: Secondary | ICD-10-CM | POA: Diagnosis present

## 2023-11-25 DIAGNOSIS — N926 Irregular menstruation, unspecified: Secondary | ICD-10-CM | POA: Insufficient documentation

## 2023-11-25 NOTE — Assessment & Plan Note (Signed)
 Referral sent to gynecology for further evaluation -hormonal testing and discussion of contrareceptive options

## 2023-11-25 NOTE — Assessment & Plan Note (Signed)
 Encouraged safe sex practices.  Testing for gonorrhea/chlamydia, trich, IV and HIV and HSV.  Review and treat accordingly.

## 2023-11-25 NOTE — Progress Notes (Signed)
 Subjective:     Patient ID: Beverly Pearson, female    DOB: [birth year], 33 y.o.   MRN: [id number]  Chief Complaint  Patient presents with   std testing   hormone testing    Last 2 cycles have been weird and has been on since 11/09/23     HPI Beverly Pearson a 33 yo female patient presents with c/o irregular periods occuring over the past 2 months. She would also like would STD testing today.  Reports that she has normally has very heavy and painful periods lasting about 4-5 days, however recently for the past 2 months she has had very light periods, little-no pain,  lasting much longer 9 to 15 days.  She is concerned that this is a drastic difference than her normal menstrual cycle that she has had for years. LMP: Started Nov 09, 2023 and reports she is still currently on her cycle (15 days). She is not currently on any contrareceptive medications.   Patient denies fever, chills, SOB, CP, palpitations, dyspnea, edema, HA, vision changes, N/V/D, abdominal pain, urinary symptoms, rash, weight changes, and recent illness or hospitalizations.  History of Present Illness              There are no preventive care reminders to display for this patient.  Past Medical History:  Diagnosis Date   Anxiety 02/17/2011   Constipation    Depression with anxiety 03/03/2011   GERD (gastroesophageal reflux disease)    Herpes simplex without mention of complication    Insomnia 09/01/2011   Overweight 04/21/2015   Reflux 02/17/2011   Reflux 03/03/2011   Reflux 09/16/2011   Tobacco use 12/25/2016    Past Surgical History:  Procedure Laterality Date   TONSILLECTOMY AND ADENOIDECTOMY     age 8   WISDOM TOOTH EXTRACTION      Family History  Problem Relation Age of Onset   Diabetes Mother        type 2   Hypertension Mother    Diabetes Maternal Grandmother        type 2   Cancer Maternal Grandmother 102       ovarian   Hypertension Maternal Grandmother    Kidney failure Maternal  Grandfather    Hypertension Maternal Grandfather    Kidney disease Maternal Grandfather    Diabetes Paternal Grandmother        type 2   Hypertension Paternal Grandmother    Emphysema Paternal Grandfather        smoke   COPD Paternal Grandfather    Cancer Paternal Grandfather 44       prostate cancer, metastatic   Colon cancer Neg Hx     Social History   Socioeconomic History   Marital status: Single    Spouse name: Not on file   Number of children: 0   Years of education: Not on file   Highest education level: Associate degree: occupational, Scientist, product/process development, or vocational program  Occupational History   Occupation: Unemployed  Tobacco Use   Smoking status: Former    Current packs/day: 0.00    Types: Cigarettes    Quit date: 12/04/2020    Years since quitting: 2.9   Smokeless tobacco: Never  Vaping Use   Vaping status: Never Used  Substance and Sexual Activity   Alcohol use: Yes    Comment: socially   Drug use: Yes    Types: Marijuana   Sexual activity: Never    Birth control/protection: Pill  Other Topics Concern  Not on file  Social History Narrative   Occasional soda    Social Drivers of Health   Financial Resource Strain: High Risk (10/05/2023)   Overall Financial Resource Strain (CARDIA)    Difficulty of Paying Living Expenses: Hard  Food Insecurity: Food Insecurity Present (10/05/2023)   Hunger Vital Sign    Worried About Running Out of Food in the Last Year: Sometimes true    Ran Out of Food in the Last Year: Never true  Transportation Needs: No Transportation Needs (10/05/2023)   PRAPARE - Administrator, Civil Service (Medical): No    Lack of Transportation (Non-Medical): No  Physical Activity: Sufficiently Active (10/05/2023)   Exercise Vital Sign    Days of Exercise per Week: 5 days    Minutes of Exercise per Session: 90 min  Stress: No Stress Concern Present (10/05/2023)   [employer] of Occupational Health - Occupational Stress  Questionnaire    Feeling of Stress : Not at all  Social Connections: Socially Isolated (10/05/2023)   Social Connection and Isolation Panel [NHANES]    Frequency of Communication with Friends and Family: Never    Frequency of Social Gatherings with Friends and Family: Never    Attends Religious Services: Never    Database administrator or Organizations: No    Attends Engineer, structural: Not on file    Marital Status: Never married  Intimate Partner Violence: Not on file    Outpatient Medications Prior to Visit  Medication Sig Dispense Refill   LORazepam  (ATIVAN ) 0.5 MG tablet Take 1 tablet by mouth twice daily as needed for anxiety 30 tablet 0   meloxicam  (MOBIC ) 15 MG tablet Take 1 tablet (15 mg total) by mouth daily. 30 tablet 0   Multiple Vitamin (MULTIVITAMIN) tablet Take 1 tablet by mouth daily as needed.     tranexamic acid  (LYSTEDA ) 650 MG TABS tablet Take 2 tablets (1,300 mg total) by mouth 3 (three) times daily. Take during menses for a maximum of five days 30 tablet 2   ursodiol  (URSO ) 250 MG tablet Take 2 tablets (500 mg total) by mouth 2 (two) times daily. 180 tablet 4   zolpidem  (AMBIEN ) 5 MG tablet Take 1 tablet (5 mg total) by mouth at bedtime as needed for sleep. 30 tablet 1   No facility-administered medications prior to visit.    Allergies  Allergen Reactions   Other Other (See Comments)    HOUSE DUST, sneezing and runny nose    ROS  See HPI    Objective:     Physical Exam General: No acute distress. Awake and conversant.  Neck: Neck is supple. No masses or thyromegaly.  Respiratory: Respirations are non-labored. No wheezing.  Skin: Warm. No rashes or ulcers.  Psych: Alert and oriented. Cooperative, Appropriate mood and affect, Normal judgment.  CV: No lower extremity edema.  MSK: Normal ambulation. No clubbing or cyanosis.     BP 128/86 (BP Location: Right Arm, Patient Position: Sitting, Cuff Size: Normal)   Pulse (!) 58   Temp 98.5 F  (36.9 C) (Oral)   Resp 12   Ht 5\' 6"  (1.676 m)   Wt 177 lb 9.6 oz (80.6 kg)   LMP 11/09/2023   SpO2 98%   BMI 28.67 kg/m  Wt Readings from Last 3 Encounters:  11/25/23 177 lb 9.6 oz (80.6 kg)  10/13/23 173 lb 4 oz (78.6 kg)  10/13/23 172 lb (78 kg)       Assessment &  Plan:   Problem List Items Addressed This Visit     Irregular periods/menstrual cycles   Referral sent to gynecology for further evaluation -hormonal testing and discussion of contrareceptive options      Relevant Orders   Ambulatory referral to Gynecology   Screening examination for STD (sexually transmitted disease) - Primary   Encouraged safe sex practices.  Testing for gonorrhea/chlamydia, trich, IV and HIV and HSV.  Review and treat accordingly.      Relevant Orders   Cervicovaginal ancillary only( [place])   Ambulatory referral to Gynecology   HSV(herpes simplex vrs) 1+2 ab-IgG   HIV antibody (with reflex)    I am having Beverly S. Pearson maintain her multivitamin, zolpidem , LORazepam , tranexamic acid , meloxicam , and ursodiol .  No orders of the defined types were placed in this encounter.

## 2023-11-26 LAB — HIV ANTIBODY (ROUTINE TESTING W REFLEX): HIV 1&2 Ab, 4th Generation: NONREACTIVE

## 2023-11-26 LAB — HSV(HERPES SIMPLEX VRS) I + II AB-IGG
HSV 1 IGG,TYPE SPECIFIC AB: <0.9 {index}
HSV 2 IGG,TYPE SPECIFIC AB: <0.9 {index}

## 2023-11-29 ENCOUNTER — Ambulatory Visit: Payer: Self-pay | Admitting: Student

## 2023-11-29 LAB — CERVICOVAGINAL ANCILLARY ONLY
Chlamydia: NEGATIVE
Comment: NEGATIVE
Comment: NEGATIVE
Comment: NORMAL
Neisseria Gonorrhea: NEGATIVE
Trichomonas: NEGATIVE

## 2023-11-30 ENCOUNTER — Ambulatory Visit: Admitting: Gastroenterology

## 2023-11-30 ENCOUNTER — Encounter: Payer: Self-pay | Admitting: Gastroenterology

## 2023-11-30 VITALS — BP 122/74 | HR 84 | Temp 98.1°F | Resp 16 | Ht 66.0 in | Wt 173.0 lb

## 2023-11-30 DIAGNOSIS — K2950 Unspecified chronic gastritis without bleeding: Secondary | ICD-10-CM

## 2023-11-30 DIAGNOSIS — R1013 Epigastric pain: Secondary | ICD-10-CM

## 2023-11-30 MED ORDER — PANTOPRAZOLE SODIUM 20 MG PO TBEC: 40.0000 mg | DELAYED_RELEASE_TABLET | Freq: Every day | ORAL | 1 refills | Status: DC

## 2023-11-30 MED ORDER — SODIUM CHLORIDE 0.9 % IV SOLN: 500.0000 mL | Freq: Once | INTRAVENOUS | Status: DC

## 2023-11-30 NOTE — Progress Notes (Signed)
 Chief Complaint: FU  Referring Provider:  Neda Balk, MD      ASSESSMENT AND PLAN;   #1. Epi pain  #2. Liver lesions- likely hepatic adenomas.  Decreased in size after stopping OCs  #3. PBC suspect (Abn Alk Phos, borderline + AMA 23.9, elevated IgM. Alk iso with Nl Liver/bone/intestinal fraction-given trial of Urso  x 1 month.  Patient stopped on her own.  Seen by Duey [facility]  #4. Asymptomatic cholelithiasis.  Plan: -CBC, CMP, AMA, GGT, IgM -FU MRI with eovist  -EGD -Avoid NSAIDs -After EGD, if needed, will start PPIs. - If still with problems, rpt US  followed by surgical consultation.  At this time, I doubt if she has biliary colic.   MRI 4/27 IMPRESSION: 1. Continued slight involution of multiple liver lesions; imaging characteristics remain consistent with hepatic adenomata. 2. No new liver lesions. 3. Cholelithiasis.   4/27 Pt with PBC (+AMA 39, IgM 380) with Nl alk phos now, GGT. No prutitis Wt 78kg, Uros at 13mg /kg   Plan: -Restart URSO  500mg  po BID #180, 4RF. Donot stop   HPI:    Beverly Pearson is a 33 y.o. female  Stopped OCs Liver lesions have decreased in size on MRI 04/22/2022.  Radiology feels it is consistent with benign hepatic adenomas rather than FNH.  Repeat MRI 06/05/2023 was unfortunately a very limited exam.  It was recommended to repeat MRI.  She had borderline positive AMA.  Mildly elevated IgM.  No pruritus.  Given trial of Urso .  Patient stopped on her own in Nov 2024.  Most recent alkaline phosphatase was normal as below.   History of Present Illness   She has been experiencing upper abdominal pain for about a year. Initially, the pain was associated with her menstrual cycles, but it now occurs independently. The pain can occur upon waking or after eating and is described as persistent throughout the day, without a specific pattern. Fatty foods do not significantly worsen the pain. No nausea, vomiting, or heartburn is  present.  She experiences bloating after eating, resulting in a pressure-like pain. She has a history of constipation, which has improved recently. Bowel movements occur at least once a day or every other day, and they do not significantly alleviate the pain.  She has been using ibuprofen for pain management, including for shoulder pain, and has also taken meloxicam  daily for two weeks, then as needed. These medications have helped alleviate her pain. No itching or heartburn is reported.  No nausea, vomiting, heartburn, regurgitation, odynophagia or dysphagia.  No significant diarrhea or constipation.  No melena or hematochezia. No unintentional weight loss.  No H/O itching, skin lesions, easy bruisability, intake of OTC meds including diet pills, herbal medications, anabolic steroids or Tylenol. There is no H/O blood transfusions, IVDA or FH of liver disease. No jaundice, dark urine or pale stools.   No alcohol abuse but does drink socially.  Wt Readings from Last 3 Encounters:  11/30/23 173 lb (78.5 kg)  11/25/23 177 lb 9.6 oz (80.6 kg)  10/13/23 173 lb 4 oz (78.6 kg)        Latest Ref Rng & Units 10/13/2023   10:05 AM 07/26/2023   10:24 AM 01/05/2023   12:03 PM  Hepatic Function  Total Protein 6.0 - 8.3 g/dL 7.4  7.3  7.1   Albumin 3.5 - 5.2 g/dL 4.1  4.4  4.3   AST 0 - 37 U/L 23  19  24    ALT 0 - 35  U/L 30  13  26    Alk Phosphatase 39 - 117 U/L 103  103  130   Total Bilirubin 0.2 - 1.2 mg/dL 0.4  0.5  0.6       Previous GI work-up: MRI 05/2023-very limited exam 1. Unfortunately, early arterial phase of contrast enhancement is significantly limited by breath motion artifact and nearly nondiagnostic. 2. Within this limitation, multiple liver lesions are thereby best appreciated on delayed hepatobiliary phase, on which they are hypointense to background parenchyma and significantly diminished in size compared to prior examination. Findings are consistent with continued  involution. 3. Cholelithiasis.    MRI 04/22/2022 IMPRESSION: 1. Interval decrease in size of the bilobar hepatic lesions (3.4 cm, 2 cm) which again demonstrate nonspecific but almost certainly benign imaging characteristics. However, taken in context with the interval stoppage of OCPs, per medical record for the last 3-4 months, the imaging findings with interval decrease in size since cessation of OCPs is highly suggestive of benign hepatic adenomas over FNH. No new lesions identified. 2. Cholelithiasis without cholecystitis.  MRI 09/22/2021 1. Bilobar arterially enhancing hepatic masses(4.2 cm, 2.5 cm) most likely reflect hepatic adenomas or focal nodular hyperplasia. Consider cessation of oral contraceptives, patient is currently on this medication, and follow-up MRI in 6 months with and without EOVIST  contrast to ensure stability and for more definitive characterization. 2. Cholelithiasis without findings of acute cholecystitis.  US  09/08/2021 1.  Cholelithiasis without evidence of acute cholecystitis. 2. Possible mass in the liver measuring up to 6.2 cm. Recommend contrast enhanced liver MRI for further evaluation. Past Medical History:  Diagnosis Date   Anxiety 02/17/2011   Constipation    Depression with anxiety 03/03/2011   GERD (gastroesophageal reflux disease)    Herpes simplex without mention of complication    Hypertension    no meds currently   Insomnia 09/01/2011   Overweight 04/21/2015   Reflux 02/17/2011   Reflux 03/03/2011   Reflux 09/16/2011   Tobacco use 12/25/2016    Past Surgical History:  Procedure Laterality Date   TONSILLECTOMY AND ADENOIDECTOMY     age 25   WISDOM TOOTH EXTRACTION      Family History  Problem Relation Age of Onset   Diabetes Mother        type 2   Hypertension Mother    Diabetes Maternal Grandmother        type 2   Cancer Maternal Grandmother 3       ovarian   Hypertension Maternal Grandmother    Kidney failure  Maternal Grandfather    Hypertension Maternal Grandfather    Kidney disease Maternal Grandfather    Diabetes Paternal Grandmother        type 2   Hypertension Paternal Grandmother    Emphysema Paternal Grandfather        smoke   COPD Paternal Grandfather    Cancer Paternal Grandfather 23       prostate cancer, metastatic   Colon cancer Neg Hx    Esophageal cancer Neg Hx    Rectal cancer Neg Hx    Stomach cancer Neg Hx     Social History   Tobacco Use   Smoking status: Former    Current packs/day: 0.00    Types: Cigarettes    Quit date: 12/04/2020    Years since quitting: 2.9   Smokeless tobacco: Never  Vaping Use   Vaping status: Never Used  Substance Use Topics   Alcohol use: Yes    Comment: socially  Drug use: Yes    Types: Marijuana    Comment: last used 11-29-23    Current Outpatient Medications  Medication Sig Dispense Refill   Multiple Vitamin (MULTIVITAMIN) tablet Take 1 tablet by mouth daily as needed.     ursodiol  (URSO ) 250 MG tablet Take 2 tablets (500 mg total) by mouth 2 (two) times daily. 180 tablet 4   LORazepam  (ATIVAN ) 0.5 MG tablet Take 1 tablet by mouth twice daily as needed for anxiety 30 tablet 0   meloxicam  (MOBIC ) 15 MG tablet Take 1 tablet (15 mg total) by mouth daily. 30 tablet 0   tranexamic acid  (LYSTEDA ) 650 MG TABS tablet Take 2 tablets (1,300 mg total) by mouth 3 (three) times daily. Take during menses for a maximum of five days 30 tablet 2   zolpidem  (AMBIEN ) 5 MG tablet Take 1 tablet (5 mg total) by mouth at bedtime as needed for sleep. 30 tablet 1   Current Facility-Administered Medications  Medication Dose Route Frequency Provider Last Rate Last Admin   0.9 %  sodium chloride infusion  500 mL Intravenous Once Lajuan Pila, MD        Allergies  Allergen Reactions   Other Other (See Comments)    HOUSE DUST, sneezing and runny nose    Review of Systems:  neg     Physical Exam:    BP (!) 143/106   Pulse (!) 58   Temp 98.1  F (36.7 C)   Ht 5\' 6"  (1.676 m)   Wt 173 lb (78.5 kg)   LMP 11/30/2023   SpO2 99%   BMI 27.92 kg/m  Wt Readings from Last 3 Encounters:  11/30/23 173 lb (78.5 kg)  11/25/23 177 lb 9.6 oz (80.6 kg)  10/13/23 173 lb 4 oz (78.6 kg)   Constitutional:  Well-developed, in no acute distress. Psychiatric: Normal mood and affect. Behavior is normal. HEENT: Pupils normal.  Conjunctivae are normal. No scleral icterus.  Cardiovascular: Normal rate, regular rhythm. No edema Pulmonary/chest: Effort normal and breath sounds normal. No wheezing, rales or rhonchi. Abdominal: Soft, nondistended. Nontender. Bowel sounds active throughout. There are no masses palpable. No hepatomegaly. Rectal: Deferred Neurological: Alert and oriented to person place and time. Skin: Skin is warm and dry. No rashes noted.  Data Reviewed: I have personally reviewed following labs and imaging studies  CBC:    Latest Ref Rng & Units 10/13/2023   10:05 AM 07/26/2023   10:24 AM 01/05/2023   12:03 PM  CBC  WBC 4.0 - 10.5 K/uL 9.1  5.6  8.3   Hemoglobin 12.0 - 15.0 g/dL 16.1  09.6  04.5   Hematocrit 36.0 - 46.0 % 43.9  44.3  43.3   Platelets 150.0 - 400.0 K/uL 359.0  347.0  328.0     CMP:    Latest Ref Rng & Units 10/13/2023   10:05 AM 07/26/2023   10:24 AM 01/05/2023   12:03 PM  CMP  Glucose 70 - 99 mg/dL 96  88  409   BUN 6 - 23 mg/dL 15  12  18    Creatinine 0.40 - 1.20 mg/dL 8.11  9.14  7.82   Sodium 135 - 145 mEq/L 136  140  137   Potassium 3.5 - 5.1 mEq/L 4.1  4.8  4.3   Chloride 96 - 112 mEq/L 100  103  104   CO2 19 - 32 mEq/L 31  29  26    Calcium 8.4 - 10.5 mg/dL 8.9  9.5  9.9  Total Protein 6.0 - 8.3 g/dL 7.4  7.3  7.1   Total Bilirubin 0.2 - 1.2 mg/dL 0.4  0.5  0.6   Alkaline Phos 39 - 117 U/L 103  103  130   AST 0 - 37 U/L 23  19  24    ALT 0 - 35 U/L 30  13  26         Magnus Schuller, MD 11/30/2023, 10:31 AM  Cc: Neda Balk, MD

## 2023-11-30 NOTE — Progress Notes (Unsigned)
    Beverly Pearson D.[facility] [address] [place] [address] Phone: [phone]   Assessment and Plan:     There are no diagnoses linked to this encounter.  ***   Pertinent previous records reviewed include ***    Follow Up: ***     Subjective:   I, Beverly Pearson, am serving as a Neurosurgeon for Doctor Ulysees Gander   Chief Complaint: right shoulder pain    HPI:    07/29/2023 Patient is a 33 year old female with right shoulder pain. Patient states started in August. Grabbed something across the table and it popped. 2 weeks after it started couldn't do anything with that arm. Radiates to the elbow from time to time. Worse during period. Only taking ibu during period when are hurts worse. No numbeness or tingling just an aching feeling all the way down to the bone. No trouble with grip strength.    08/31/2023 Patient states still has pain but not as intense meloxicam  helped    09/21/2023 Patient states it is hurting a little bit today. Yesterday took meloxicam  because it was hurting. Thought that the steroid shot would knock everything out but it still hurts and still radiates.    10/13/2023 Patient states she is felling a little better since MRI    12/01/2023 Patient states   Relevant Historical Information: Hypertension, GERD    Additional pertinent review of systems negative.   Current Outpatient Medications:    LORazepam  (ATIVAN ) 0.5 MG tablet, Take 1 tablet by mouth twice daily as needed for anxiety, Disp: 30 tablet, Rfl: 0   meloxicam  (MOBIC ) 15 MG tablet, Take 1 tablet (15 mg total) by mouth daily., Disp: 30 tablet, Rfl: 0   Multiple Vitamin (MULTIVITAMIN) tablet, Take 1 tablet by mouth daily as needed., Disp: , Rfl:    tranexamic acid  (LYSTEDA ) 650 MG TABS tablet, Take 2 tablets (1,300 mg total) by mouth 3 (three) times daily. Take during menses for a maximum of five days, Disp: 30 tablet, Rfl: 2   ursodiol  (URSO ) 250 MG  tablet, Take 2 tablets (500 mg total) by mouth 2 (two) times daily., Disp: 180 tablet, Rfl: 4   zolpidem  (AMBIEN ) 5 MG tablet, Take 1 tablet (5 mg total) by mouth at bedtime as needed for sleep., Disp: 30 tablet, Rfl: 1   Objective:     There were no vitals filed for this visit.    There is no height or weight on file to calculate BMI.    Physical Exam:    ***   Electronically signed by:  Marshall Skeeter D.[facility] 7:50 AM 11/30/23

## 2023-11-30 NOTE — Op Note (Signed)
 [facility] Patient Name: Beverly Pearson Procedure Date: 11/30/2023 10:25 AM MRN: [id number] Endoscopist: Lajuan Pila , MD, [id number] Age: 33 Referring MD:  Date of Birth: 06-04-1991 Gender: Female Account #: [id number] Procedure:                Upper GI endoscopy Indications:              1. Epigastric abdominal pain. 2. Pt with recently                            Dx PBC (+AMA 39, IgM 380) with Nl alk phos now,                            GGT. No prutitis. 3. Asymptomatic cholelithiasis. Medicines:                Monitored Anesthesia Care Procedure:                Pre-Anesthesia Assessment:                           - Prior to the procedure, a History and Physical                            was performed, and patient medications and                            allergies were reviewed. The patient's tolerance of                            previous anesthesia was also reviewed. The risks                            and benefits of the procedure and the sedation                            options and risks were discussed with the patient.                            All questions were answered, and informed consent                            was obtained. Prior Anticoagulants: The patient has                            taken no anticoagulant or antiplatelet agents. ASA                            Grade Assessment: II - A patient with mild systemic                            disease. After reviewing the risks and benefits,                            the patient was deemed in satisfactory condition to  undergo the procedure.                           After obtaining informed consent, the endoscope was                            passed under direct vision. Throughout the                            procedure, the patient's blood pressure, pulse, and                            oxygen saturations were monitored continuously. The                            GIF  [id number] #[id number] was introduced through the                            mouth, and advanced to the second part of duodenum.                            The upper GI endoscopy was accomplished without                            difficulty. The patient tolerated the procedure                            well. Scope In: Scope Out: Findings:                 The examined esophagus was normal.                           The Z-line was regular and was found 36 cm from the                            incisors.                           Localized moderate inflammation characterized by                            congestion (edema), erosions and erythema was found                            in the gastric antrum. Biopsies were taken with a                            cold forceps for histology.                           The examined duodenum was normal. Biopsies for                            histology were taken with a cold forceps for  evaluation of celiac disease. Complications:            No immediate complications. Estimated Blood Loss:     Estimated blood loss: none. Impression:               - Erosive gastritis. Recommendation:           - Patient has a contact number available for                            emergencies. The signs and symptoms of potential                            delayed complications were discussed with the                            patient. Return to normal activities tomorrow.                            Written discharge instructions were provided to the                            patient.                           - Resume previous diet.                           - Avoid nonsteroidals.                           - Protonix 40 mg p.o. daily x 4 weeks, then can                            stop.                           - Await pathology results.                           - FU if still with problems.                           - She has follow-up  appointment with [facility].                            Continue Urso                            - The findings and recommendations were discussed                            with the patient's family. Lajuan Pila, MD 11/30/2023 10:52:17 AM This report has been signed electronically.

## 2023-11-30 NOTE — Patient Instructions (Signed)

## 2023-11-30 NOTE — Progress Notes (Signed)
 [id number]: Pt requiring bag mask ventilation with oral airway after completion of EGD until ready to be transported to PACU. Pt awake with stable vital signs before transport

## 2023-11-30 NOTE — Progress Notes (Signed)
 Pt A/O x 3, gd SR's, pleased with anesthesia, report to RN

## 2023-11-30 NOTE — Progress Notes (Signed)
 Called to room to assist during endoscopic procedure.  Patient ID and intended procedure confirmed with present staff. Received instructions for my participation in the procedure from the performing physician.

## 2023-12-01 ENCOUNTER — Telehealth: Payer: Self-pay

## 2023-12-01 ENCOUNTER — Ambulatory Visit (INDEPENDENT_AMBULATORY_CARE_PROVIDER_SITE_OTHER): Admitting: Sports Medicine

## 2023-12-01 VITALS — BP 104/70 | HR 65 | Ht 66.0 in | Wt 180.0 lb

## 2023-12-01 DIAGNOSIS — G8929 Other chronic pain: Secondary | ICD-10-CM | POA: Diagnosis not present

## 2023-12-01 DIAGNOSIS — S46011D Strain of muscle(s) and tendon(s) of the rotator cuff of right shoulder, subsequent encounter: Secondary | ICD-10-CM

## 2023-12-01 DIAGNOSIS — M25511 Pain in right shoulder: Secondary | ICD-10-CM | POA: Diagnosis not present

## 2023-12-01 NOTE — Telephone Encounter (Signed)
 Left message on follow up call.

## 2023-12-01 NOTE — Patient Instructions (Signed)
 Tylenol as needed. - Use meloxicam  15 mg daily as needed for pain.  Recommend limiting chronic NSAIDs to 1-2 doses per week to prevent Netz-term side effects. Continue home exercises and physical therapy. Follow up in 6 to 8 weeks.

## 2023-12-02 LAB — SURGICAL PATHOLOGY

## 2023-12-05 ENCOUNTER — Ambulatory Visit: Payer: Self-pay | Admitting: Gastroenterology

## 2023-12-07 ENCOUNTER — Ambulatory Visit: Attending: Sports Medicine

## 2023-12-07 ENCOUNTER — Other Ambulatory Visit: Payer: Self-pay

## 2023-12-07 DIAGNOSIS — M25511 Pain in right shoulder: Secondary | ICD-10-CM | POA: Diagnosis present

## 2023-12-07 DIAGNOSIS — G8929 Other chronic pain: Secondary | ICD-10-CM | POA: Insufficient documentation

## 2023-12-07 DIAGNOSIS — S46011D Strain of muscle(s) and tendon(s) of the rotator cuff of right shoulder, subsequent encounter: Secondary | ICD-10-CM | POA: Insufficient documentation

## 2023-12-07 NOTE — Therapy (Signed)
 OUTPATIENT PHYSICAL THERAPY SHOULDER EVALUATION   Patient Name: Beverly Pearson MRN: [id number] DOB:[birth year], 33 y.o., female Today's Date: 12/07/2023  END OF SESSION:  PT End of Session - 12/07/23 1026     Visit Number 1    Date for PT Re-Evaluation 02/01/24    Progress Note Due on Visit 10    PT Start Time 1020    PT Stop Time 1102    PT Time Calculation (min) 42 min    Activity Tolerance Patient tolerated treatment well    Behavior During Therapy [facility] for tasks assessed/performed          Past Medical History:  Diagnosis Date   Anxiety 02/17/2011   Constipation    Depression with anxiety 03/03/2011   GERD (gastroesophageal reflux disease)    Herpes simplex without mention of complication    Hypertension    no meds currently   Insomnia 09/01/2011   Overweight 04/21/2015   Reflux 02/17/2011   Reflux 03/03/2011   Reflux 09/16/2011   Tobacco use 12/25/2016   Past Surgical History:  Procedure Laterality Date   TONSILLECTOMY AND ADENOIDECTOMY     age 52   WISDOM TOOTH EXTRACTION     Patient Active Problem List   Diagnosis Date Noted   Irregular periods/menstrual cycles 11/25/2023   Screening examination for STD (sexually transmitted disease) 11/25/2023   Family history of malignant neoplasm of ovary 09/27/2023   Family history of malignant neoplasm of prostate 09/27/2023   Chronic right shoulder pain 07/26/2023   Pain 09/30/2022   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   Calculus of gallbladder without cholecystitis without obstruction 08/14/2022   Alkaline phosphatase elevation 08/14/2022   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   Dysmenorrhea 11/22/2019   Abnormal hemoglobin (Hgb) (HCC) 11/22/2019   Hypertension 08/04/2017   Preventative health care 07/01/2017   H/O tobacco use, presenting hazards to health 12/25/2016   Overweight 04/21/2015   GERD (gastroesophageal reflux disease) 09/16/2011   Insomnia 09/01/2011   Depression with anxiety 03/03/2011    PCP: Randie Bustle,  MD  REFERRING PROVIDER: Ulysees Gander, DO  REFERRING DIAG: R shoulder injury, supraspinatus tendonitis  THERAPY DIAG:  Chronic right shoulder pain - Plan: PT plan of care cert/re-cert  Rationale for Evaluation and Treatment: Rehabilitation  ONSET DATE: August 2024  SUBJECTIVE:                                                                                                                                                                                      SUBJECTIVE STATEMENT: Reached awkwardly across a table and up to pick up my friend purse for her, felt sharp pain, after it happened couldn't lift my R arm  for 2 weeks, still painful and weak.  The shot with the MRI worked great.  I do work out at [employer], free weights, for chest press, shoulder press, shoulder abd, with some pain noted, doesn't feel as strong.   Hand dominance: Right  PERTINENT HISTORY: A above, has had 2 injections one AC jt and one with MRI  PAIN:  Are you having pain? Yes: NPRS scale: 0 to 5 Pain location: R superior GH jt line Pain description: pain with specific movements, over reaching, sharp pain Aggravating factors: lying on R side, certain reaching movements Relieving factors: injections helped  PRECAUTIONS: None  RED FLAGS: None   WEIGHT BEARING RESTRICTIONS: No  FALLS:  Has patient fallen in last 6 months? No  LIVING ENVIRONMENT: Lives with: lives with their family Lives in: House/apartment Stairs: unclear Has following equipment at home: na  OCCUPATION: Make up stylist, and makes candles   PLOF: Independent  PATIENT GOALS:regain function , strength R shoulder without pain  NEXT MD VISIT:   OBJECTIVE:  Note: Objective measures were completed at Evaluation unless otherwise noted.  DIAGNOSTIC FINDINGS:  MRI R shoulder supraspinatus tendonitis   PATIENT SURVEYS:  Total Disability Score: 4 / 80 = 5.0 %  COGNITION: Overall cognitive status: Within functional limits for  tasks assessed     SENSATION: WFL  POSTURE: No significant postural deformities noted  UPPER EXTREMITY ROM:   Passive ROM Right eval Left eval  Shoulder flexion Wfl, some end range tightness   Shoulder extension    Shoulder abduction Wfl    Shoulder adduction    Shoulder internal rotation    Shoulder external rotation wfl   Elbow flexion    Elbow extension    Wrist flexion    Wrist extension    Wrist ulnar deviation    Wrist radial deviation    Wrist pronation    Wrist supination    (Blank rows = not tested)  UPPER EXTREMITY MMT:  MMT Right eval Left eval  Shoulder flexion wfl   Shoulder extension    Shoulder abduction 4+   Shoulder adduction    Shoulder internal rotation    Shoulder external rotation 4   Middle trapezius 3   Lower trapezius 3+   Elbow flexion    Elbow extension    Wrist flexion    Wrist extension    Wrist ulnar deviation    Wrist radial deviation    Wrist pronation    Wrist supination    Grip strength (lbs)    (Blank rows = wfl)  SHOULDER SPECIAL TESTS: Impingement tests: Neer impingement test: positive  SLAP lesions: Biceps load test: negative Instability tests: Apprehension test: negative and Sulcus sign: negative Rotator cuff assessment: External rotation lag sign: positive  and Belly press test: negative Biceps assessment: Abrigo head biceps load II test : -  JOINT MOBILITY TESTING:  GH jt mobility wnl, R scapular movement diminished  PALPATION:  Some tenderness over R [place] head biceps, tender R supraspinatus  TREATMENT DATE: 12/07/23: Evaluation , education on anatomy R shoulder, role of R rotator cuff, expected treatment progression Instructed in fast motion, low amplitude B shoulder ER with elbows locked into side. Also prone middle traps engagement with horizontal shoulder abd, thumb to ceiling,  education to engage R scapular retractors, avoid using upper traps   PATIENT EDUCATION: Education details: POC, goals Person educated: Patient Education method: Explanation, Demonstration, Tactile cues, and Verbal cues Education comprehension: verbalized understanding, returned demonstration, verbal cues required, tactile cues required, and needs further education  HOME EXERCISE PROGRAM: Instructed in prone horizontal shoulder abd with thumb up, also fast movement low amplitude B shoulder ER with yellow t band, to fatigue, each up to 30 reps, encouraged to perform prior to gym work outs for muscle memory.   ASSESSMENT:  CLINICAL IMPRESSION: Patient is a 33 y.o. female  who was evaluated today by physical therapy due to R rotator cuff injury.  She is active, works out consistently.  She has some tenderness over the supraspinatus insertion, pain and sx replicated primarily with resisted R shoulder ER.  Also very weak, poor control of scapular retractors.  Should benefit from physical therapy intervention with guided exercises to address her motor control, muscle memory, re training of shoulder mechanics.  Advised her also that we can utilize manual techniques, modalities to help with pain as needed.    OBJECTIVE IMPAIRMENTS: decreased strength, increased fascial restrictions, impaired UE functional use, improper body mechanics, postural dysfunction, and pain.   ACTIVITY LIMITATIONS: lifting, sleeping, and reach over head  PARTICIPATION LIMITATIONS: meal prep, cleaning, community activity, occupation, and yard work  PERSONAL FACTORS: Time since onset of injury/illness/exacerbation and 1 comorbidity: HTN are also affecting patient's functional outcome.   REHAB POTENTIAL: Good  CLINICAL DECISION MAKING: Evolving/moderate complexity  EVALUATION COMPLEXITY: Moderate   GOALS: Goals reviewed with patient? Yes  SHORT TERM GOALS: Target date: 12/21/23, 2 weeks  I HEP Baseline: Goal status:  INITIAL   [place] TERM GOALS: Target date: 02/01/24, 8 weeks   SPADI improve from 5 % to 0 % disability Baseline:  Goal status: INITIAL  2.  Strength R shoulder for Er and horizontal abduction improve from 3+/5 to 5/5 for normal shoulder mechanics Baseline:  Goal status: INITIAL  3.  Able to sleep without discomfort/ difficulty due to shoulder pain Baseline:  Goal status: INITIAL   PLAN:  PT FREQUENCY: every other week  PT DURATION: 8 weeks  PLANNED INTERVENTIONS: 97110-Therapeutic exercises, 97530- Therapeutic activity, [id number]- Neuromuscular re-education, 97535- Self Care, 16109- Manual therapy, and 97033- Ionotophoresis 4mg /ml Dexamethasone  PLAN FOR NEXT SESSION: reassess strength R shoulder, how are ex working, progress intensity as appropriate   Judithe Keetch L Johnnell Liou, PT, DPT, OCS 12/07/2023, 1:46 PM

## 2023-12-08 DIAGNOSIS — Z860100 Personal history of colon polyps, unspecified: Secondary | ICD-10-CM | POA: Insufficient documentation

## 2023-12-08 NOTE — Assessment & Plan Note (Signed)
 Well controlled, no changes to meds. Encouraged heart healthy diet such as the DASH diet and exercise as tolerated.

## 2023-12-08 NOTE — Assessment & Plan Note (Signed)
 Recent polypectomy with [facility]

## 2023-12-09 ENCOUNTER — Ambulatory Visit (INDEPENDENT_AMBULATORY_CARE_PROVIDER_SITE_OTHER): Admitting: Family Medicine

## 2023-12-09 ENCOUNTER — Ambulatory Visit: Payer: Self-pay | Admitting: Family Medicine

## 2023-12-09 VITALS — BP 126/72 | HR 64 | Resp 16 | Ht 66.0 in | Wt 176.6 lb

## 2023-12-09 DIAGNOSIS — N926 Irregular menstruation, unspecified: Secondary | ICD-10-CM

## 2023-12-09 DIAGNOSIS — I10 Essential (primary) hypertension: Secondary | ICD-10-CM

## 2023-12-09 DIAGNOSIS — Z860100 Personal history of colon polyps, unspecified: Secondary | ICD-10-CM

## 2023-12-09 DIAGNOSIS — M25511 Pain in right shoulder: Secondary | ICD-10-CM | POA: Diagnosis not present

## 2023-12-09 DIAGNOSIS — Z79899 Other long term (current) drug therapy: Secondary | ICD-10-CM | POA: Diagnosis not present

## 2023-12-09 DIAGNOSIS — E663 Overweight: Secondary | ICD-10-CM

## 2023-12-09 DIAGNOSIS — F418 Other specified anxiety disorders: Secondary | ICD-10-CM

## 2023-12-09 LAB — CBC WITH DIFFERENTIAL/PLATELET
Basophils Absolute: 0 10*3/uL (ref 0.0–0.1)
Basophils Relative: 0.3 % (ref 0.0–3.0)
Eosinophils Absolute: 0.1 10*3/uL (ref 0.0–0.7)
Eosinophils Relative: 1.4 % (ref 0.0–5.0)
HCT: 42.2 % (ref 36.0–46.0)
Hemoglobin: 13.8 g/dL (ref 12.0–15.0)
Lymphocytes Relative: 41.2 % (ref 12.0–46.0)
Lymphs Abs: 2.1 10*3/uL (ref 0.7–4.0)
MCHC: 32.6 g/dL (ref 30.0–36.0)
MCV: 86.6 fl (ref 78.0–100.0)
Monocytes Absolute: 0.4 10*3/uL (ref 0.1–1.0)
Monocytes Relative: 7.5 % (ref 3.0–12.0)
Neutro Abs: 2.5 10*3/uL (ref 1.4–7.7)
Neutrophils Relative %: 49.6 % (ref 43.0–77.0)
Platelets: 264 10*3/uL (ref 150.0–400.0)
RBC: 4.87 Mil/uL (ref 3.87–5.11)
RDW: 14.2 % (ref 11.5–15.5)
WBC: 5.1 10*3/uL (ref 4.0–10.5)

## 2023-12-09 LAB — TSH: TSH: 1.07 u[IU]/mL (ref 0.35–5.50)

## 2023-12-09 LAB — SEDIMENTATION RATE: Sed Rate: 6 mm/h (ref 0–20)

## 2023-12-09 NOTE — Patient Instructions (Signed)
 Heavy or Long-Lasting Menstrual Periods: What to Know Heavy or long-lasting periods are also called abnormal uterine bleeding. This is when your monthly periods are heavy or last longer than normal.  If you have this condition, bleeding and cramping may get so bad that they make it hard for you to do your daily activities. What are the causes? Common causes of this condition include: Growths in the uterus (polyps or fibroids). These growths are not cancer. Problems with the tissue that lines the uterus. This tissue may: Grow into the walls of the uterus (adenomyosis). Grow outside the uterus (endometriosis). One of the ovaries not releasing an egg during one or more months. A disorder that stops the blood from clotting normally. Some medicines. Infection. In some cases, the cause of this condition is not known. What increases the risk? You are more likely to get this condition if you have cancer of the uterus. What are the signs or symptoms? Symptoms of this condition include: Heavy bleeding. You may bleed so much that you have: To change your pad or tampon every 1-2 hours. To use pads and tampons at the same time. To wake up to change your pads or tampons during the night. Blood clots that are larger than 1 inch (2.5 cm) in size. Bleeding that lasts for more than 7 days. How is this diagnosed? This condition may be diagnosed based on a physical exam and your symptoms. Your health care provider will ask you about your period. You may also have tests, including: Blood tests. Pap test. Biopsy. A small piece of the tissue that lines the uterus is tested. Ultrasound of the uterus, ovaries, and vagina. Looking inside the uterus using a flexible tube with a light (hysteroscope). How is this treated? You may not need treatment for this condition. But if you need treatment, you may be given: Birth control pills or an IUD. Hormone therapy. Medicine to reduce swelling, such as  ibuprofen. Medicine to make growths in the uterus smaller. Medicines to make your blood clot. Iron pills. Antibiotics. These are given if your bleeding is caused by an infection. If medicines do not work, surgery may be done. Surgery may be done to: Remove a part of the lining of the uterus. Remove growths in the uterus. These may be polyps or fibroids. Remove the entire lining of the uterus. Remove the uterus entirely. Talk with your provider about your options for treatment. Some treatments may make it hard for you to get pregnant. Tell your provider if you'd like to get pregnant after treatment. Follow these instructions at home: Medicines Take your medicines only as told. Do not change or switch medicines without asking your provider. Do not take aspirin or medicines that have aspirin 1 week before your period, or during your period. Aspirin may make bleeding worse. Managing trouble pooping Your iron pills may cause trouble pooping (constipation). To help prevent or treat this, you may need to: Take medicines to help you poop. Eat foods high in fiber, like beans, whole grains, and fresh fruits and vegetables. Drink more fluids as told. General instructions If you need to change your pad or tampon more than once every 2 hours, limit your activity until the bleeding stops. Eat well-balanced meals, including foods that are high in iron. Foods that have a lot of iron include leafy, green vegetables, meat, liver, eggs, and whole-grain breads and cereals. Do not try to lose weight until the heavy bleeding has stopped and your blood iron level is  back to normal. If you need to lose weight, work with your provider to lose weight safely. Keep all follow-up visits. Your provider will make sure your treatment is working. Your provider may adjust your treatment plan if it is not working. Contact a health care provider if: You soak through a pad or tampon every 1 or 2 hours, and this happens every  time you have a period. You need to use pads and tampons at the same time because you are bleeding so much. You vomit or feel like you vomiting. You have watery poop (diarrhea). You have problems from the medicines you are taking. You feel very weak or tired. You feel dizzy or you faint. Get help right away if: You soak through more than a pad or tampon in 1 hour. You pass clots bigger than 1 inch (2.5 cm) wide. You feel short of breath. You feel like your heart is beating too fast. These symptoms may be an emergency. Call 911 right away. Do not wait to see if the symptoms will go away. Do not drive yourself to the hospital. This information is not intended to replace advice given to you by your health care provider. Make sure you discuss any questions you have with your health care provider. Document Revised: 04/20/2023 Document Reviewed: 12/01/2022 Elsevier Patient Education  2024 [employer].

## 2023-12-09 NOTE — Assessment & Plan Note (Signed)
 Encouraged good sleep hygiene such as dark, quiet room. No blue/green glowing lights such as computer screens in bedroom. No alcohol or stimulants in evening. Cut down on caffeine as able. Regular exercise is helpful but not just prior to bed time.  Paperwork updated for Ambien 

## 2023-12-09 NOTE — Progress Notes (Signed)
 Subjective:    Patient ID: Beverly Pearson, female    DOB: 1990-08-20, 33 y.o.   MRN: [id number]  Chief Complaint  Patient presents with   Acute Visit    Patient presents today for a irregular menstrual cycles.    HPI Discussed the use of AI scribe software for clinical note transcription with the patient, who gave verbal consent to proceed.  History of Present Illness Beverly Pearson is a 33 year old female who presents with irregular and prolonged menstrual bleeding.  Over the past two months, she has experienced significant changes in her menstrual cycle. Historically, her cycles have been four days [place] with heavy flow and severe pain, often preventing her from going to work. However, the last two cycles have deviated from this pattern. The most recent cycle lasted three weeks with light to moderate flow and virtually no pain, while the cycle prior lasted ten days with a similarly light flow. She denies any chance of pregnancy, having taken a test to confirm. No new fevers, chills, back pain, discharge, lesions, or sores. There have been no significant life changes such as a big move, new partner, or major stressors that could account for these changes in her menstrual cycle.  Her family history is notable for her mother experiencing menopause at the age of 75 and having fibroids, which could be relevant to her current symptoms.  She has a history of right shoulder pain due to an injury, for which she has started physical therapy. An MRI previously showed mild changes consistent with inflammation or injury. She received a steroid injection into the joint during the MRI, which provided relief, unlike a prior injection into the muscle.    Past Medical History:  Diagnosis Date   Anxiety 02/17/2011   Constipation    Depression with anxiety 03/03/2011   GERD (gastroesophageal reflux disease)    Herpes simplex without mention of complication    Hypertension    no meds currently    Insomnia 09/01/2011   Overweight 04/21/2015   Reflux 02/17/2011   Reflux 03/03/2011   Reflux 09/16/2011   Tobacco use 12/25/2016    Past Surgical History:  Procedure Laterality Date   TONSILLECTOMY AND ADENOIDECTOMY     age 18   WISDOM TOOTH EXTRACTION      Family History  Problem Relation Age of Onset   Diabetes Mother        type 2   Hypertension Mother    Diabetes Maternal Grandmother        type 2   Cancer Maternal Grandmother 82       ovarian   Hypertension Maternal Grandmother    Kidney failure Maternal Grandfather    Hypertension Maternal Grandfather    Kidney disease Maternal Grandfather    Diabetes Paternal Grandmother        type 2   Hypertension Paternal Grandmother    Emphysema Paternal Grandfather        smoke   COPD Paternal Grandfather    Cancer Paternal Grandfather 27       prostate cancer, metastatic   Colon cancer Neg Hx    Esophageal cancer Neg Hx    Rectal cancer Neg Hx    Stomach cancer Neg Hx     Social History   Socioeconomic History   Marital status: Single    Spouse name: Not on file   Number of children: 0   Years of education: Not on file   Highest education level: Associate  degree: occupational, technical, or vocational program  Occupational History   Occupation: Unemployed  Tobacco Use   Smoking status: Former    Current packs/day: 0.00    Types: Cigarettes    Quit date: 12/04/2020    Years since quitting: 3.0   Smokeless tobacco: Never  Vaping Use   Vaping status: Never Used  Substance and Sexual Activity   Alcohol use: Yes    Comment: socially   Drug use: Yes    Types: Marijuana    Comment: last used 11-29-23   Sexual activity: Never    Birth control/protection: Pill    Comment: on period 11-30-23  Other Topics Concern   Not on file  Social History Narrative   Occasional soda    Social Drivers of Health   Financial Resource Strain: High Risk (12/09/2023)   Overall Financial Resource Strain (CARDIA)    Difficulty  of Paying Living Expenses: Hard  Food Insecurity: Food Insecurity Present (12/09/2023)   Hunger Vital Sign    Worried About Running Out of Food in the Last Year: Sometimes true    Ran Out of Food in the Last Year: Sometimes true  Transportation Needs: No Transportation Needs (12/09/2023)   PRAPARE - Administrator, Civil Service (Medical): No    Lack of Transportation (Non-Medical): No  Physical Activity: Sufficiently Active (12/09/2023)   Exercise Vital Sign    Days of Exercise per Week: 4 days    Minutes of Exercise per Session: 120 min  Stress: No Stress Concern Present (12/09/2023)   [employer] of Occupational Health - Occupational Stress Questionnaire    Feeling of Stress: Only a little  Social Connections: Socially Isolated (12/09/2023)   Social Connection and Isolation Panel    Frequency of Communication with Friends and Family: Once a week    Frequency of Social Gatherings with Friends and Family: Once a week    Attends Religious Services: Never    Database administrator or Organizations: No    Attends Engineer, structural: Not on file    Marital Status: Never married  Intimate Partner Violence: Not on file    Outpatient Medications Prior to Visit  Medication Sig Dispense Refill   LORazepam  (ATIVAN ) 0.5 MG tablet Take 1 tablet by mouth twice daily as needed for anxiety 30 tablet 0   meloxicam  (MOBIC ) 15 MG tablet Take 1 tablet (15 mg total) by mouth daily. 30 tablet 0   Multiple Vitamin (MULTIVITAMIN) tablet Take 1 tablet by mouth daily as needed.     pantoprazole  (PROTONIX ) 20 MG tablet Take 2 tablets (40 mg total) by mouth daily. 60 tablet 1   ursodiol  (URSO ) 250 MG tablet Take 2 tablets (500 mg total) by mouth 2 (two) times daily. 180 tablet 4   zolpidem  (AMBIEN ) 5 MG tablet Take 1 tablet (5 mg total) by mouth at bedtime as needed for sleep. 30 tablet 1   tranexamic acid  (LYSTEDA ) 650 MG TABS tablet Take 2 tablets (1,300 mg total) by mouth 3 (three)  times daily. Take during menses for a maximum of five days (Patient not taking: Reported on 12/09/2023) 30 tablet 2   No facility-administered medications prior to visit.    Allergies  Allergen Reactions   Other Other (See Comments)    HOUSE DUST, sneezing and runny nose    ROS     Objective:    Physical Exam  BP 126/72   Pulse 64   Resp 16   Ht 5' 6 (  1.676 m)   Wt 176 lb 9.6 oz (80.1 kg)   LMP 11/09/2023 (Exact Date)   SpO2 98%   BMI 28.50 kg/m  Wt Readings from Last 3 Encounters:  12/09/23 176 lb 9.6 oz (80.1 kg)  12/01/23 180 lb (81.6 kg)  11/30/23 173 lb (78.5 kg)    Diabetic Foot Exam - Simple   No data filed    Lab Results  Component Value Date   WBC 9.1 10/13/2023   HGB 14.5 10/13/2023   HCT 43.9 10/13/2023   PLT 359.0 10/13/2023   GLUCOSE 96 10/13/2023   CHOL 190 07/26/2023   TRIG 36.0 07/26/2023   HDL 98.70 07/26/2023   LDLCALC 85 07/26/2023   ALT 30 10/13/2023   AST 23 10/13/2023   NA 136 10/13/2023   K 4.1 10/13/2023   CL 100 10/13/2023   CREATININE 0.79 10/13/2023   BUN 15 10/13/2023   CO2 31 10/13/2023   TSH 2.05 07/26/2023   HGBA1C 5.7 07/26/2023    Lab Results  Component Value Date   TSH 2.05 07/26/2023   Lab Results  Component Value Date   WBC 9.1 10/13/2023   HGB 14.5 10/13/2023   HCT 43.9 10/13/2023   MCV 88.2 10/13/2023   PLT 359.0 10/13/2023   Lab Results  Component Value Date   NA 136 10/13/2023   K 4.1 10/13/2023   CO2 31 10/13/2023   GLUCOSE 96 10/13/2023   BUN 15 10/13/2023   CREATININE 0.79 10/13/2023   BILITOT 0.4 10/13/2023   ALKPHOS 103 10/13/2023   AST 23 10/13/2023   ALT 30 10/13/2023   PROT 7.4 10/13/2023   ALBUMIN 4.1 10/13/2023   CALCIUM 8.9 10/13/2023   GFR 99.31 10/13/2023   Lab Results  Component Value Date   CHOL 190 07/26/2023   Lab Results  Component Value Date   HDL 98.70 07/26/2023   Lab Results  Component Value Date   LDLCALC 85 07/26/2023   Lab Results  Component Value Date    TRIG 36.0 07/26/2023   Lab Results  Component Value Date   CHOLHDL 2 07/26/2023   Lab Results  Component Value Date   HGBA1C 5.7 07/26/2023       Assessment & Plan:  Hx of colonic polyp Assessment & Plan: Recent polypectomy with [facility]   Primary hypertension Assessment & Plan: Well controlled, no changes to meds. Encouraged heart healthy diet such as the DASH diet and exercise as tolerated.    Orders: -     TSH  High risk medication use -     Drug Monitoring Panel [phone] , Urine  Irregular menses -     CBC with Differential/Platelet -     US  PELVIC COMPLETE WITH TRANSVAGINAL; Future  Right shoulder pain, unspecified chronicity -     Sedimentation rate  Overweight Assessment & Plan: Encouraged good sleep hygiene such as dark, quiet room. No blue/green glowing lights such as computer screens in bedroom. No alcohol or stimulants in evening. Cut down on caffeine as able. Regular exercise is helpful but not just prior to bed time.  Paperwork updated for Ambien    Depression with anxiety Assessment & Plan: Doing well. Paperwork completed to continue Ativan  prn, infrequent use.     Assessment and Plan Assessment & Plan Irregular and Prolonged Menstrual Bleeding Menstrual irregularity with differential diagnosis of hormonal imbalances, fibroids, or early perimenopause. Future fertility considerations may influence GYN referral. - Order pelvic ultrasound (abdominal and transvaginal) to assess endometrial lining thickness, ovarian  cysts, or other abnormalities. - Order CBC and TSH to evaluate for anemia and thyroid  function. - Consider referral to GYN if irregularity persists or worsens. - Discuss potential hormonal treatment if irregularity continues for three more months.  Chronic Right Shoulder Pain Chronic right shoulder pain post-injury with MRI indicating mild inflammation. Previous steroid injection provided relief. - Continue physical therapy for right  shoulder. - Encourage continued use of the shoulder to prevent frozen shoulder.  General Health Maintenance Blood pressure well-controlled. Previous metabolic panel, cholesterol, and thyroid  tests normal. - Monitor blood pressure. - Repeat thyroid  function tests if symptoms suggestive of thyroid  dysfunction occur.  Follow-up Follow-up scheduled for August and February 2026. - Keep August follow-up appointment unless symptoms resolve. - Maintain February 2026 appointment.     Randie Bustle, MD

## 2023-12-09 NOTE — Assessment & Plan Note (Signed)
 Doing well. Paperwork completed to continue Ativan  prn, infrequent use.

## 2023-12-10 NOTE — Telephone Encounter (Signed)
 Copied from CRM [id number]. Topic: Clinical - Lab/Test Results >> Dec 10, 2023 11:59 AM Elita Guitar wrote: Reason for CRM: pt called to return Hydie Langan's call. I relayed results/comment per Dr. Geraldene Kleine. Pt verbalized understanding.

## 2023-12-13 LAB — DRUG MONITORING PANEL 376104, URINE
Amphetamines: NEGATIVE ng/mL (ref <500)
Barbiturates: NEGATIVE ng/mL (ref <300)
Benzodiazepines: NEGATIVE ng/mL (ref <100)
Cocaine Metabolite: NEGATIVE ng/mL (ref <150)
Desmethyltramadol: NEGATIVE ng/mL (ref <100)
Opiates: NEGATIVE ng/mL (ref <100)
Oxycodone: NEGATIVE ng/mL (ref <100)
Tramadol: NEGATIVE ng/mL (ref <100)

## 2023-12-13 LAB — DM TEMPLATE

## 2023-12-16 ENCOUNTER — Ambulatory Visit (HOSPITAL_BASED_OUTPATIENT_CLINIC_OR_DEPARTMENT_OTHER)
Admission: RE | Admit: 2023-12-16 | Discharge: 2023-12-16 | Disposition: A | Discharge status: Home / Self Care | Source: Ambulatory Visit | Attending: Family Medicine | Admitting: Family Medicine

## 2023-12-16 DIAGNOSIS — N926 Irregular menstruation, unspecified: Secondary | ICD-10-CM | POA: Diagnosis present

## 2023-12-20 ENCOUNTER — Encounter: Payer: Self-pay | Admitting: Physical Therapy

## 2023-12-20 ENCOUNTER — Ambulatory Visit: Admitting: Physical Therapy

## 2023-12-20 DIAGNOSIS — M25511 Pain in right shoulder: Secondary | ICD-10-CM | POA: Diagnosis not present

## 2023-12-20 DIAGNOSIS — G8929 Other chronic pain: Secondary | ICD-10-CM

## 2023-12-20 NOTE — Therapy (Signed)
 OUTPATIENT PHYSICAL THERAPY TREATMENT   Patient Name: [place] S Hokenson MRN: [id number] DOB:11-20-1990, 33 y.o., female Today's Date: 12/20/2023  END OF SESSION:  PT End of Session - 12/20/23 0809     Visit Number 2    Date for PT Re-Evaluation 02/01/24    Progress Note Due on Visit 10    PT Start Time 0809   late sign in   PT Stop Time 0843    PT Time Calculation (min) 34 min    Activity Tolerance Patient tolerated treatment well    Behavior During Therapy [facility] for tasks assessed/performed           Past Medical History:  Diagnosis Date   Anxiety 02/17/2011   Constipation    Depression with anxiety 03/03/2011   GERD (gastroesophageal reflux disease)    Herpes simplex without mention of complication    Hypertension    no meds currently   Insomnia 09/01/2011   Overweight 04/21/2015   Reflux 02/17/2011   Reflux 03/03/2011   Reflux 09/16/2011   Tobacco use 12/25/2016   Past Surgical History:  Procedure Laterality Date   TONSILLECTOMY AND ADENOIDECTOMY     age 33   WISDOM TOOTH EXTRACTION     Patient Active Problem List   Diagnosis Date Noted   Hx of colonic polyp 12/08/2023   Irregular periods/menstrual cycles 11/25/2023   Screening examination for STD (sexually transmitted disease) 11/25/2023   Family history of malignant neoplasm of ovary 09/27/2023   Family history of malignant neoplasm of prostate 09/27/2023   Chronic right shoulder pain 07/26/2023   Pain 09/30/2022   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   Calculus of gallbladder without cholecystitis without obstruction 08/14/2022   Alkaline phosphatase elevation 08/14/2022   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   Dysmenorrhea 11/22/2019   Abnormal hemoglobin (Hgb) (HCC) 11/22/2019   Hypertension 08/04/2017   Preventative health care 07/01/2017   H/O tobacco use, presenting hazards to health 12/25/2016   Overweight 04/21/2015   GERD (gastroesophageal reflux disease) 09/16/2011   Insomnia 09/01/2011   Depression with anxiety  03/03/2011    PCP: Domenica Blackbird, MD  REFERRING PROVIDER: Leonce Katz, DO  REFERRING DIAG: R shoulder injury, supraspinatus tendonitis  THERAPY DIAG:  Chronic right shoulder pain  Rationale for Evaluation and Treatment: Rehabilitation  ONSET DATE: August 2024  SUBJECTIVE:                                                                                                                                                                                      SUBJECTIVE STATEMENT: Pt states that the bed exercise hurt. Shoulder is feeling fine this morning. Pt states it was hurting yesterday while sleeping. Had  to take meloxicam  to help  From eval: Reached awkwardly across a table and up to pick up my friend purse for her, felt sharp pain, after it happened couldn't lift my R arm for 2 weeks, still painful and weak.  The shot with the MRI worked great.  I do work out at [employer], free weights, for chest press, shoulder press, shoulder abd, with some pain noted, doesn't feel as strong.   Hand dominance: Right  PERTINENT HISTORY: A above, has had 2 injections one AC jt and one with MRI  PAIN:  Are you having pain? Yes: NPRS scale: 0 to 5 Pain location: R superior GH jt line Pain description: pain with specific movements, over reaching, sharp pain Aggravating factors: lying on R side, certain reaching movements Relieving factors: injections helped  PRECAUTIONS: None  RED FLAGS: None   WEIGHT BEARING RESTRICTIONS: No  FALLS:  Has patient fallen in last 6 months? No  LIVING ENVIRONMENT: Lives with: lives with their family Lives in: House/apartment Stairs: unclear Has following equipment at home: na  OCCUPATION: Make up stylist, and makes candles   PLOF: Independent  PATIENT GOALS:regain function , strength R shoulder without pain  NEXT MD VISIT:   OBJECTIVE:  Note: Objective measures were completed at Evaluation unless otherwise noted.  DIAGNOSTIC FINDINGS:  MRI  R shoulder supraspinatus tendonitis   PATIENT SURVEYS:  Total Disability Score: 4 / 80 = 5.0 %  POSTURE: No significant postural deformities noted  UPPER EXTREMITY ROM:   Passive ROM Right eval Left eval  Shoulder flexion Wfl, some end range tightness   Shoulder extension    Shoulder abduction Wfl    Shoulder adduction    Shoulder internal rotation    Shoulder external rotation wfl   Elbow flexion    Elbow extension    Wrist flexion    Wrist extension    Wrist ulnar deviation    Wrist radial deviation    Wrist pronation    Wrist supination    (Blank rows = not tested)  UPPER EXTREMITY MMT:  MMT Right eval Left eval  Shoulder flexion wfl   Shoulder extension    Shoulder abduction 4+   Shoulder adduction    Shoulder internal rotation    Shoulder external rotation 4   Middle trapezius 3   Lower trapezius 3+   Elbow flexion    Elbow extension    Wrist flexion    Wrist extension    Wrist ulnar deviation    Wrist radial deviation    Wrist pronation    Wrist supination    Grip strength (lbs)    (Blank rows = wfl)  SHOULDER SPECIAL TESTS: Impingement tests: Neer impingement test: positive  SLAP lesions: Biceps load test: negative Instability tests: Apprehension test: negative and Sulcus sign: negative Rotator cuff assessment: External rotation lag sign: positive  and Belly press test: negative Biceps assessment: South head biceps load II test : -  JOINT MOBILITY TESTING:  GH jt mobility wnl, R scapular movement diminished  PALPATION:  Some tenderness over R Ake head biceps, tender R supraspinatus  TREATMENT DATE:  12/20/23: Shoulder pulleys flexion and scaption x2 min each Seated shoulder ER eccentrics yellow TB 3x10 Prone W 3x10 Prone row 3x10 Supine shoulder IR/ER at 80 deg abd 3x10 Supine Y yellow TB 3x10 Scapular wall  clock yellow TB to fatigue  12/07/23: Evaluation , education on anatomy R shoulder, role of R rotator cuff, expected treatment progression Instructed in fast motion, low amplitude B shoulder ER with elbows locked into side. Also prone middle traps engagement with horizontal shoulder abd, thumb to ceiling, education to engage R scapular retractors, avoid using upper traps   PATIENT EDUCATION: Education details: POC, goals Person educated: Patient Education method: Explanation, Demonstration, Tactile cues, and Verbal cues Education comprehension: verbalized understanding, returned demonstration, verbal cues required, tactile cues required, and needs further education  HOME EXERCISE PROGRAM: Access Code: [email or web address] URL: [email or web address] Date: 12/20/2023 Prepared by: Rosamaria Donn April Earnie Starring  Exercises - Prone W Scapular Retraction  - 1 x daily - 7 x weekly - 3 sets - 10 reps - Supine Shoulder External and Internal Rotation in Abduction with Dumbbell  - 1 x daily - 7 x weekly - 3 sets - 10 reps - Shoulder External Rotation and Scapular Retraction with Resistance  - 1 x daily - 7 x weekly - 3 sets - 10 reps - Standing Plank on Wall with Reaches and Resistance  - 1 x daily - 7 x weekly - 3 sets - 10 reps   ASSESSMENT:  CLINICAL IMPRESSION: Modified HEP to eccentric shoulder ER vs low amplitude fast shoulder ER as pt reports some pain with initial pulses. Fatigues with mid trap strengthening and scapular exercises. Modified supine shoulder IR/ER with arm support so arm was in better alignment with her GH joint as this was the most uncomfortable exercise for her. Focus was primarily on improving pt's scapular control   From eval: Patient is a 33 y.o. female  who was evaluated today by physical therapy due to R rotator cuff injury.  She is active, works out consistently.  She has some tenderness over the supraspinatus insertion, pain and sx replicated primarily with resisted R  shoulder ER.  Also very weak, poor control of scapular retractors.  Should benefit from physical therapy intervention with guided exercises to address her motor control, muscle memory, re training of shoulder mechanics.  Advised her also that we can utilize manual techniques, modalities to help with pain as needed.    OBJECTIVE IMPAIRMENTS: decreased strength, increased fascial restrictions, impaired UE functional use, improper body mechanics, postural dysfunction, and pain.     GOALS: Goals reviewed with patient? Yes  SHORT TERM GOALS: Target date: 12/21/23, 2 weeks  I HEP Baseline: Goal status: INITIAL   [place] TERM GOALS: Target date: 02/01/24, 8 weeks   SPADI improve from 5 % to 0 % disability Baseline:  Goal status: INITIAL  2.  Strength R shoulder for Er and horizontal abduction improve from 3+/5 to 5/5 for normal shoulder mechanics Baseline:  Goal status: INITIAL  3.  Able to sleep without discomfort/ difficulty due to shoulder pain Baseline:  Goal status: INITIAL   PLAN:  PT FREQUENCY: every other week  PT DURATION: 8 weeks  PLANNED INTERVENTIONS: 97110-Therapeutic exercises, 97530- Therapeutic activity, 97112- Neuromuscular re-education, 97535- Self Care, 02859- Manual therapy, and 97033- Ionotophoresis 4mg /ml Dexamethasone  PLAN FOR NEXT SESSION: reassess strength R shoulder, how are ex working, progress intensity as appropriate   Claron Rosencrans April Ma L Avien Taha, PT, DPT 12/20/2023, 8:09 AM

## 2023-12-21 NOTE — Progress Notes (Signed)
Patient reviewed via MyChart.

## 2024-01-04 ENCOUNTER — Ambulatory Visit: Attending: Sports Medicine

## 2024-01-04 ENCOUNTER — Other Ambulatory Visit: Payer: Self-pay

## 2024-01-04 DIAGNOSIS — G8929 Other chronic pain: Secondary | ICD-10-CM | POA: Insufficient documentation

## 2024-01-04 DIAGNOSIS — M25511 Pain in right shoulder: Secondary | ICD-10-CM | POA: Diagnosis present

## 2024-01-04 NOTE — Therapy (Signed)
 OUTPATIENT PHYSICAL THERAPY TREATMENT   Patient Name: Beverly Pearson MRN: [id number] DOB:June 01, 1991, 33 y.o., female Today's Date: 01/04/2024  END OF SESSION:  PT End of Session - 01/04/24 1159     Visit Number 3    Date for PT Re-Evaluation 02/01/24    Progress Note Due on Visit 10    PT Start Time 1018    PT Stop Time 1100    PT Time Calculation (min) 42 min    Activity Tolerance Patient tolerated treatment well    Behavior During Therapy [facility] for tasks assessed/performed            Past Medical History:  Diagnosis Date   Anxiety 02/17/2011   Constipation    Depression with anxiety 03/03/2011   GERD (gastroesophageal reflux disease)    Herpes simplex without mention of complication    Hypertension    no meds currently   Insomnia 09/01/2011   Overweight 04/21/2015   Reflux 02/17/2011   Reflux 03/03/2011   Reflux 09/16/2011   Tobacco use 12/25/2016   Past Surgical History:  Procedure Laterality Date   TONSILLECTOMY AND ADENOIDECTOMY     age 33   WISDOM TOOTH EXTRACTION     Patient Active Problem List   Diagnosis Date Noted   Hx of colonic polyp 12/08/2023   Irregular periods/menstrual cycles 11/25/2023   Screening examination for STD (sexually transmitted disease) 11/25/2023   Family history of malignant neoplasm of ovary 09/27/2023   Family history of malignant neoplasm of prostate 09/27/2023   Chronic right shoulder pain 07/26/2023   Pain 09/30/2022   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   Calculus of gallbladder without cholecystitis without obstruction 08/14/2022   Alkaline phosphatase elevation 08/14/2022   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   Dysmenorrhea 11/22/2019   Abnormal hemoglobin (Hgb) (HCC) 11/22/2019   Hypertension 08/04/2017   Preventative health care 07/01/2017   H/O tobacco use, presenting hazards to health 12/25/2016   Overweight 04/21/2015   GERD (gastroesophageal reflux disease) 09/16/2011   Insomnia 09/01/2011   Depression with anxiety 03/03/2011     PCP: Domenica Blackbird, MD  REFERRING PROVIDER: Leonce Katz, DO  REFERRING DIAG: R shoulder injury, supraspinatus tendonitis  THERAPY DIAG:  Chronic right shoulder pain  Rationale for Evaluation and Treatment: Rehabilitation  ONSET DATE: August 2024  SUBJECTIVE:                                                                                                                                                                                      SUBJECTIVE STATEMENT: Pt states that when she reaches her shoulder out to side and across body to other shoulder it feels like the bones in her R  shoulder are scrubbing together  From eval: Reached awkwardly across a table and up to pick up my friend purse for her, felt sharp pain, after it happened couldn't lift my R arm for 2 weeks, still painful and weak.  The shot with the MRI worked great.  I do work out at [employer], free weights, for chest press, shoulder press, shoulder abd, with some pain noted, doesn't feel as strong.   Hand dominance: Right  PERTINENT HISTORY: A above, has had 2 injections one AC jt and one with MRI  PAIN:  Are you having pain? Yes: NPRS scale: 0 to 5 Pain location: R superior GH jt line Pain description: pain with specific movements, over reaching, sharp pain Aggravating factors: lying on R side, certain reaching movements Relieving factors: injections helped  PRECAUTIONS: None  RED FLAGS: None   WEIGHT BEARING RESTRICTIONS: No  FALLS:  Has patient fallen in last 6 months? No  LIVING ENVIRONMENT: Lives with: lives with their family Lives in: House/apartment Stairs: unclear Has following equipment at home: na  OCCUPATION: Make up stylist, and makes candles   PLOF: Independent  PATIENT GOALS:regain function , strength R shoulder without pain  NEXT MD VISIT:   OBJECTIVE:  Note: Objective measures were completed at Evaluation unless otherwise noted.  DIAGNOSTIC FINDINGS:  MRI R shoulder  supraspinatus tendonitis   PATIENT SURVEYS:  Total Disability Score: 4 / 80 = 5.0 %  POSTURE: No significant postural deformities noted  UPPER EXTREMITY ROM:   Passive ROM Right eval Left eval  Shoulder flexion Wfl, some end range tightness   Shoulder extension    Shoulder abduction Wfl    Shoulder adduction    Shoulder internal rotation    Shoulder external rotation wfl   Elbow flexion    Elbow extension    Wrist flexion    Wrist extension    Wrist ulnar deviation    Wrist radial deviation    Wrist pronation    Wrist supination    (Blank rows = not tested)  UPPER EXTREMITY MMT:  MMT Right eval Left eval  Shoulder flexion wfl   Shoulder extension    Shoulder abduction 4+   Shoulder adduction    Shoulder internal rotation    Shoulder external rotation 4   Middle trapezius 3   Lower trapezius 3+   Elbow flexion    Elbow extension    Wrist flexion    Wrist extension    Wrist ulnar deviation    Wrist radial deviation    Wrist pronation    Wrist supination    Grip strength (lbs)    (Blank rows = wfl)  SHOULDER SPECIAL TESTS: Impingement tests: Neer impingement test: positive  SLAP lesions: Biceps load test: negative Instability tests: Apprehension test: negative and Sulcus sign: negative Rotator cuff assessment: External rotation lag sign: positive  and Belly press test: negative Biceps assessment: [place] head biceps load II test : -  JOINT MOBILITY TESTING:  GH jt mobility wnl, R scapular movement diminished  PALPATION:  Some tenderness over R Boike head biceps, tender R supraspinatus  TREATMENT DATE:  01/04/24:  Supine with R shoulder ER Rom assessment - but pain with returning to neutral. Non tender with palpation R shoulder along GH jt line, rot cuff. Kinesiotaping R shoulder 2 I pieces extending from ant GH jt to middle traps and  from lat delts to upper traps Side lying L with towel under axilla, for ER, mass practice, with 1 # wt Supine serratus punches with 1 #, punches, and circles cw and ccw Prone modified plank, moving from prone on elbows/forearms to plank position, also modified with sustained modified plank with walking forearms in square pattern   12/20/23: Shoulder pulleys flexion and scaption x2 min each Seated shoulder ER eccentrics yellow TB 3x10 Prone W 3x10 Prone row 3x10 Supine shoulder IR/ER at 80 deg abd 3x10 Supine Y yellow TB 3x10 Scapular wall clock yellow TB to fatigue  12/07/23: Evaluation , education on anatomy R shoulder, role of R rotator cuff, expected treatment progression Instructed in fast motion, low amplitude B shoulder ER with elbows locked into side. Also prone middle traps engagement with horizontal shoulder abd, thumb to ceiling, education to engage R scapular retractors, avoid using upper traps   PATIENT EDUCATION: Education details: POC, goals Person educated: Patient Education method: Explanation, Demonstration, Tactile cues, and Verbal cues Education comprehension: verbalized understanding, returned demonstration, verbal cues required, tactile cues required, and needs further education  HOME EXERCISE PROGRAM: Access Code: [email or web address] URL: [email or web address] Date: 01/04/2024 Prepared by: Greig Mattisen Pohlmann  Exercises - Sidelying Shoulder ER with Towel and Dumbbell  - 1 x daily - 7 x weekly - 1 sets - 10 reps - Supine Scapular Protraction in Flexion with Dumbbells  - 1 x daily - 7 x weekly - 1 sets - 10 reps - Kneeling Plank with Feet on Ground  - 1 x daily - 7 x weekly - 1 sets - 10 reps - Prone Shoulder Horizontal Abduction  - 1 x daily - 7 x weekly - 1 sets - 10 reps Access Code: [email or web address] URL: [email or web address] Date: 12/20/2023 Prepared by: Gellen April Earnie Starring  Exercises - Prone W Scapular Retraction  - 1 x daily - 7 x weekly  - 3 sets - 10 reps - Supine Shoulder External and Internal Rotation in Abduction with Dumbbell  - 1 x daily - 7 x weekly - 3 sets - 10 reps - Shoulder External Rotation and Scapular Retraction with Resistance  - 1 x daily - 7 x weekly - 3 sets - 10 reps - Standing Plank on Wall with Reaches and Resistance  - 1 x daily - 7 x weekly - 3 sets - 10 reps   ASSESSMENT:  CLINICAL IMPRESSION: Modified HEP again, to address stability R shoulder jt.  Pt tolerated the stability ex well. Kinesiotaping did not really help much. Consistent excessive , hypermobility noted R shoulder with her painful movements, requiring reset/ positioning R humerus inferiorly and posteriorly.  She will return to referring sports medicine MD. And he will re evaluate.  From eval: Patient is a 33 y.o. female  who was evaluated today by physical therapy due to R rotator cuff injury.  She is active, works out consistently.  She has some tenderness over the supraspinatus insertion, pain and sx replicated primarily with resisted R shoulder ER.  Also very weak, poor control of scapular retractors.  Should benefit from physical therapy intervention with guided exercises to address her motor control, muscle memory, re training of shoulder mechanics.  Advised her also that we can utilize  manual techniques, modalities to help with pain as needed.    OBJECTIVE IMPAIRMENTS: decreased strength, increased fascial restrictions, impaired UE functional use, improper body mechanics, postural dysfunction, and pain.     GOALS: Goals reviewed with patient? Yes  SHORT TERM GOALS: Target date: 12/21/23, 2 weeks  I HEP Baseline: Goal status: INITIAL   [place] TERM GOALS: Target date: 02/01/24, 8 weeks   SPADI improve from 5 % to 0 % disability Baseline:  Goal status: INITIAL  2.  Strength R shoulder for Er and horizontal abduction improve from 3+/5 to 5/5 for normal shoulder mechanics Baseline:  Goal status: INITIAL  3.  Able to sleep without  discomfort/ difficulty due to shoulder pain Baseline:  Goal status: 01/04/24: improved, met so far   PLAN:  PT FREQUENCY: every other week  PT DURATION: 8 weeks  PLANNED INTERVENTIONS: 97110-Therapeutic exercises, 97530- Therapeutic activity, 97112- Neuromuscular re-education, 97535- Self Care, 02859- Manual therapy, and 97033- Ionotophoresis 4mg /ml Dexamethasone  PLAN FOR NEXT SESSION: reassess strength R shoulder, how are ex working, progress intensity as appropriate   Abriel Hattery L Cherril Hett, PT, DPT 01/04/2024, 12:05 PM

## 2024-01-11 NOTE — Progress Notes (Deleted)
    [name] D.[facility] [address] [place] [address] Phone: [phone]   Assessment and Plan:     There are no diagnoses linked to this encounter.  ***   Pertinent previous records reviewed include ***    Follow Up: ***     Subjective:   Chief Complaint: right shoulder pain    HPI:    07/29/2023 Patient is a 33 year old female with right shoulder pain. Patient states started in August. Grabbed something across the table and it popped. 2 weeks after it started couldn't do anything with that arm. Radiates to the elbow from time to time. Worse during period. Only taking ibu during period when are hurts worse. No numbeness or tingling just an aching feeling all the way down to the bone. No trouble with grip strength.    08/31/2023 Patient states still has pain but not as intense meloxicam  helped    09/21/2023 Patient states it is hurting a little bit today. Yesterday took meloxicam  because it was hurting. Thought that the steroid shot would knock everything out but it still hurts and still radiates.    10/13/2023 Patient states she is felling a little better since MRI     12/01/2023 Patient states feeling good. It comes randomly. The pain is not as bad. When it flares it hurts just a little bit and then goes away.  01/12/2024 Patient states  Relevant Historical Information: Hypertension, GERD   Additional pertinent review of systems negative.   Current Outpatient Medications:    LORazepam  (ATIVAN ) 0.5 MG tablet, Take 1 tablet by mouth twice daily as needed for anxiety, Disp: 30 tablet, Rfl: 0   meloxicam  (MOBIC ) 15 MG tablet, Take 1 tablet (15 mg total) by mouth daily., Disp: 30 tablet, Rfl: 0   Multiple Vitamin (MULTIVITAMIN) tablet, Take 1 tablet by mouth daily as needed., Disp: , Rfl:    pantoprazole  (PROTONIX ) 20 MG tablet, Take 2 tablets (40 mg total) by mouth daily., Disp: 60 tablet, Rfl: 1   tranexamic acid  (LYSTEDA ) 650  MG TABS tablet, Take 2 tablets (1,300 mg total) by mouth 3 (three) times daily. Take during menses for a maximum of five days (Patient not taking: Reported on 12/09/2023), Disp: 30 tablet, Rfl: 2   ursodiol  (URSO ) 250 MG tablet, Take 2 tablets (500 mg total) by mouth 2 (two) times daily., Disp: 180 tablet, Rfl: 4   zolpidem  (AMBIEN ) 5 MG tablet, Take 1 tablet (5 mg total) by mouth at bedtime as needed for sleep., Disp: 30 tablet, Rfl: 1   Objective:     There were no vitals filed for this visit.    There is no height or weight on file to calculate BMI.    Physical Exam:    ***   Electronically signed by:  Odis Mace D.[facility] 9:52 AM 01/11/24

## 2024-01-12 ENCOUNTER — Ambulatory Visit: Admitting: Sports Medicine

## 2024-01-18 ENCOUNTER — Encounter

## 2024-01-20 ENCOUNTER — Ambulatory Visit

## 2024-01-20 DIAGNOSIS — G8929 Other chronic pain: Secondary | ICD-10-CM

## 2024-01-20 DIAGNOSIS — M25511 Pain in right shoulder: Secondary | ICD-10-CM | POA: Diagnosis not present

## 2024-01-20 NOTE — Therapy (Signed)
 OUTPATIENT PHYSICAL THERAPY TREATMENT   Patient Name: Beverly Pearson MRN: [id number] DOB:[birth year], 33 y.o., female Today's Date: 01/20/2024  END OF SESSION:  PT End of Session - 01/20/24 1402     Visit Number 4    Date for PT Re-Evaluation 02/01/24    Progress Note Due on Visit 10    PT Start Time 1358    PT Stop Time 1439    PT Time Calculation (min) 41 min    Activity Tolerance Patient tolerated treatment well    Behavior During Therapy [facility] for tasks assessed/performed             Past Medical History:  Diagnosis Date   Anxiety 02/17/2011   Constipation    Depression with anxiety 03/03/2011   GERD (gastroesophageal reflux disease)    Herpes simplex without mention of complication    Hypertension    no meds currently   Insomnia 09/01/2011   Overweight 04/21/2015   Reflux 02/17/2011   Reflux 03/03/2011   Reflux 09/16/2011   Tobacco use 12/25/2016   Past Surgical History:  Procedure Laterality Date   TONSILLECTOMY AND ADENOIDECTOMY     age 18   WISDOM TOOTH EXTRACTION     Patient Active Problem List   Diagnosis Date Noted   Hx of colonic polyp 12/08/2023   Irregular periods/menstrual cycles 11/25/2023   Screening examination for STD (sexually transmitted disease) 11/25/2023   Family history of malignant neoplasm of ovary 09/27/2023   Family history of malignant neoplasm of prostate 09/27/2023   Chronic right shoulder pain 07/26/2023   Pain 09/30/2022   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   Calculus of gallbladder without cholecystitis without obstruction 08/14/2022   Alkaline phosphatase elevation 08/14/2022   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   Dysmenorrhea 11/22/2019   Abnormal hemoglobin (Hgb) (HCC) 11/22/2019   Hypertension 08/04/2017   Preventative health care 07/01/2017   H/O tobacco use, presenting hazards to health 12/25/2016   Overweight 04/21/2015   GERD (gastroesophageal reflux disease) 09/16/2011   Insomnia 09/01/2011   Depression with anxiety 03/03/2011     PCP: Domenica Blackbird, MD  REFERRING PROVIDER: Leonce Katz, DO  REFERRING DIAG: R shoulder injury, supraspinatus tendonitis  THERAPY DIAG:  Chronic right shoulder pain  Rationale for Evaluation and Treatment: Rehabilitation  ONSET DATE: August 2024  SUBJECTIVE:                                                                                                                                                                                      SUBJECTIVE STATEMENT: Pt states that when she reaches her shoulder out to side and across body to other shoulder it feels like the bones in her  R shoulder are scrubbing together  From eval: Reached awkwardly across a table and up to pick up my friend purse for her, felt sharp pain, after it happened couldn't lift my R arm for 2 weeks, still painful and weak.  The shot with the MRI worked great.  I do work out at [employer], free weights, for chest press, shoulder press, shoulder abd, with some pain noted, doesn't feel as strong.   Hand dominance: Right  PERTINENT HISTORY: A above, has had 2 injections one AC jt and one with MRI  PAIN:  Are you having pain? Yes: NPRS scale: 0 to 5 Pain location: R superior GH jt line Pain description: pain with specific movements, over reaching, sharp pain Aggravating factors: lying on R side, certain reaching movements Relieving factors: injections helped  PRECAUTIONS: None  RED FLAGS: None   WEIGHT BEARING RESTRICTIONS: No  FALLS:  Has patient fallen in last 6 months? No  LIVING ENVIRONMENT: Lives with: lives with their family Lives in: House/apartment Stairs: unclear Has following equipment at home: na  OCCUPATION: Make up stylist, and makes candles   PLOF: Independent  PATIENT GOALS:regain function , strength R shoulder without pain  NEXT MD VISIT:   OBJECTIVE:  Note: Objective measures were completed at Evaluation unless otherwise noted.  DIAGNOSTIC FINDINGS:  MRI R shoulder  supraspinatus tendonitis   PATIENT SURVEYS:  Total Disability Score: 4 / 80 = 5.0 %  POSTURE: No significant postural deformities noted  UPPER EXTREMITY ROM:   Passive ROM Right eval Left eval  Shoulder flexion Wfl, some end range tightness   Shoulder extension    Shoulder abduction Wfl    Shoulder adduction    Shoulder internal rotation    Shoulder external rotation wfl   Elbow flexion    Elbow extension    Wrist flexion    Wrist extension    Wrist ulnar deviation    Wrist radial deviation    Wrist pronation    Wrist supination    (Blank rows = not tested)  UPPER EXTREMITY MMT:  MMT Right eval Left eval R 01/20/24  Shoulder flexion wfl    Shoulder extension     Shoulder abduction 4+    Shoulder adduction     Shoulder internal rotation     Shoulder external rotation 4  4- pain  Middle trapezius 3  4  Lower trapezius 3+  4-  Elbow flexion     Elbow extension     Wrist flexion     Wrist extension     Wrist ulnar deviation     Wrist radial deviation     Wrist pronation     Wrist supination     Grip strength (lbs)     (Blank rows = wfl)  SHOULDER SPECIAL TESTS: Impingement tests: Neer impingement test: positive  SLAP lesions: Biceps load test: negative Instability tests: Apprehension test: negative and Sulcus sign: negative Rotator cuff assessment: External rotation lag sign: positive  and Belly press test: negative Biceps assessment: Stopper head biceps load II test : -  JOINT MOBILITY TESTING:  GH jt mobility wnl, R scapular movement diminished  PALPATION:  Some tenderness over R Ned head biceps, tender R supraspinatus  TREATMENT DATE:  01/20/24 UBE L1.0 47F/3B Prone shoulder ext 2lb 2x10 S/L R shoulder HABD x 20  S/L R shoulder ER 2lb x 20 S/L R shoulder flexion 2lb x 10  Standing rows RTB x 20  Standing shoulder ext RTB x  20 Standing wall clocks YTB 2x5 B Standing lower trap raise YTB 2x10 B  01/04/24:  Supine with R shoulder ER Rom assessment - but pain with returning to neutral. Non tender with palpation R shoulder along GH jt line, rot cuff. Kinesiotaping R shoulder 2 I pieces extending from ant GH jt to middle traps and from lat delts to upper traps Side lying L with towel under axilla, for ER, mass practice, with 1 # wt Supine serratus punches with 1 #, punches, and circles cw and ccw Prone modified plank, moving from prone on elbows/forearms to plank position, also modified with sustained modified plank with walking forearms in square pattern   12/20/23: Shoulder pulleys flexion and scaption x2 min each Seated shoulder ER eccentrics yellow TB 3x10 Prone W 3x10 Prone row 3x10 Supine shoulder IR/ER at 80 deg abd 3x10 Supine Y yellow TB 3x10 Scapular wall clock yellow TB to fatigue  12/07/23: Evaluation , education on anatomy R shoulder, role of R rotator cuff, expected treatment progression Instructed in fast motion, low amplitude B shoulder ER with elbows locked into side. Also prone middle traps engagement with horizontal shoulder abd, thumb to ceiling, education to engage R scapular retractors, avoid using upper traps   PATIENT EDUCATION: Education details: HEP update- wall clocks and lower trap raise Person educated: Patient Education method: Explanation, Demonstration, Tactile cues, and Verbal cues Education comprehension: verbalized understanding, returned demonstration, verbal cues required, tactile cues required, and needs further education  HOME EXERCISE PROGRAM: Access Code: [email or web address] URL: [email or web address] Date: 01/04/2024 Prepared by: Greig Speaks  Exercises - Sidelying Shoulder ER with Towel and Dumbbell  - 1 x daily - 7 x weekly - 1 sets - 10 reps - Supine Scapular Protraction in Flexion with Dumbbells  - 1 x daily - 7 x weekly - 1 sets - 10 reps - Kneeling  Plank with Feet on Ground  - 1 x daily - 7 x weekly - 1 sets - 10 reps - Prone Shoulder Horizontal Abduction  - 1 x daily - 7 x weekly - 1 sets - 10 reps Access Code: [email or web address] URL: [email or web address] Date: 12/20/2023 Prepared by: Gellen April Earnie Starring  Exercises - Prone W Scapular Retraction  - 1 x daily - 7 x weekly - 3 sets - 10 reps - Supine Shoulder External and Internal Rotation in Abduction with Dumbbell  - 1 x daily - 7 x weekly - 3 sets - 10 reps - Shoulder External Rotation and Scapular Retraction with Resistance  - 1 x daily - 7 x weekly - 3 sets - 10 reps - Standing Plank on Wall with Reaches and Resistance  - 1 x daily - 7 x weekly - 3 sets - 10 reps   ASSESSMENT:  CLINICAL IMPRESSION: Pt tolerated the progression of exercises well. Advanced RTC and periscapular strength. Pt fatigued after most exercises but no increased pain. Provided cuing to correct form and technique with exercises. Pain seems to be intermittent now compared to when she first began therapy.   From eval: Patient is a 33 y.o. female  who was evaluated today by physical therapy due to R rotator cuff injury.  She is active, works out consistently.  She has some tenderness over  the supraspinatus insertion, pain and sx replicated primarily with resisted R shoulder ER.  Also very weak, poor control of scapular retractors.  Should benefit from physical therapy intervention with guided exercises to address her motor control, muscle memory, re training of shoulder mechanics.  Advised her also that we can utilize manual techniques, modalities to help with pain as needed.    OBJECTIVE IMPAIRMENTS: decreased strength, increased fascial restrictions, impaired UE functional use, improper body mechanics, postural dysfunction, and pain.     GOALS: Goals reviewed with patient? Yes  SHORT TERM GOALS: Target date: 12/21/23, 2 weeks  I HEP Baseline: Goal status: INITIAL   [place] TERM GOALS: Target date:  02/01/24, 8 weeks   SPADI improve from 5 % to 0 % disability Baseline:  Goal status: INITIAL  2.  Strength R shoulder for Er and horizontal abduction improve from 3+/5 to 5/5 for normal shoulder mechanics Baseline:  Goal status: INITIAL  3.  Able to sleep without discomfort/ difficulty due to shoulder pain Baseline:  Goal status: 01/04/24: improved, met so far   PLAN:  PT FREQUENCY: every other week  PT DURATION: 8 weeks  PLANNED INTERVENTIONS: 97110-Therapeutic exercises, 97530- Therapeutic activity, 97112- Neuromuscular re-education, 97535- Self Care, 02859- Manual therapy, and 97033- Ionotophoresis 4mg /ml Dexamethasone  PLAN FOR NEXT SESSION: how was MD visit? how are ex working, progress intensity as appropriate   Sol LITTIE Gaskins, PTA 01/20/2024, 2:40 PM

## 2024-01-20 NOTE — Progress Notes (Deleted)
    [facility] [address] [place] [address] Phone: [phone]   Assessment and Plan:     There are no diagnoses linked to this encounter.  ***   Pertinent previous records reviewed include ***    Follow Up: ***     Subjective:   Chief Complaint: right shoulder pain    HPI:    07/29/2023 Patient is a 33 year old female with right shoulder pain. Patient states started in August. Grabbed something across the table and it popped. 2 weeks after it started couldn't do anything with that arm. Radiates to the elbow from time to time. Worse during period. Only taking ibu during period when are hurts worse. No numbeness or tingling just an aching feeling all the way down to the bone. No trouble with grip strength.    08/31/2023 Patient states still has pain but not as intense meloxicam  helped    09/21/2023 Patient states it is hurting a little bit today. Yesterday took meloxicam  because it was hurting. Thought that the steroid shot would knock everything out but it still hurts and still radiates.    10/13/2023 Patient states she is felling a little better since MRI     12/01/2023 Patient states feeling good. It comes randomly. The pain is not as bad. When it flares it hurts just a little bit and then goes away.  01/21/2024 Patient states  Relevant Historical Information: Hypertension, GERD   Additional pertinent review of systems negative.   Current Outpatient Medications:    LORazepam  (ATIVAN ) 0.5 MG tablet, Take 1 tablet by mouth twice daily as needed for anxiety, Disp: 30 tablet, Rfl: 0   meloxicam  (MOBIC ) 15 MG tablet, Take 1 tablet (15 mg total) by mouth daily., Disp: 30 tablet, Rfl: 0   Multiple Vitamin (MULTIVITAMIN) tablet, Take 1 tablet by mouth daily as needed., Disp: , Rfl:    pantoprazole  (PROTONIX ) 20 MG tablet, Take 2 tablets (40 mg total) by mouth daily., Disp: 60 tablet, Rfl: 1   tranexamic acid  (LYSTEDA ) 650 MG  TABS tablet, Take 2 tablets (1,300 mg total) by mouth 3 (three) times daily. Take during menses for a maximum of five days (Patient not taking: Reported on 12/09/2023), Disp: 30 tablet, Rfl: 2   ursodiol  (URSO ) 250 MG tablet, Take 2 tablets (500 mg total) by mouth 2 (two) times daily., Disp: 180 tablet, Rfl: 4   zolpidem  (AMBIEN ) 5 MG tablet, Take 1 tablet (5 mg total) by mouth at bedtime as needed for sleep., Disp: 30 tablet, Rfl: 1   Objective:     There were no vitals filed for this visit.    There is no height or weight on file to calculate BMI.    Physical Exam:    ***   Electronically signed by:  Odis Mace D.[name] 7:40 AM 01/20/24

## 2024-01-21 ENCOUNTER — Ambulatory Visit: Admitting: Sports Medicine

## 2024-01-27 ENCOUNTER — Ambulatory Visit: Payer: Medicaid Other | Admitting: Family Medicine

## 2024-02-01 ENCOUNTER — Ambulatory Visit: Admitting: Rehabilitation

## 2024-02-09 ENCOUNTER — Ambulatory Visit: Attending: Sports Medicine

## 2024-02-10 ENCOUNTER — Ambulatory Visit: Admitting: Family Medicine

## 2024-02-14 ENCOUNTER — Ambulatory Visit: Admitting: Family Medicine

## 2024-02-18 ENCOUNTER — Encounter: Payer: Self-pay | Admitting: Sports Medicine

## 2024-02-22 ENCOUNTER — Encounter: Payer: Self-pay | Admitting: Sports Medicine

## 2024-04-19 ENCOUNTER — Telehealth: Payer: Self-pay

## 2024-04-19 NOTE — Telephone Encounter (Signed)
 Left message for pt to call back

## 2024-04-19 NOTE — Telephone Encounter (Signed)
-----   Message from Nurse Elspeth RAMAN sent at 10/18/2023 11:09 AM EDT -----  Personal reminder sent on 10/18/2022.   From lab results.  Pt made aware to follow up in GI clinic in 8 months. Reminder placed in Epic to follow up in GI clinic in 8 months  Dr. Charlanne pt. Pt needs to be around end of December for follow up appointment.

## 2024-04-24 NOTE — Telephone Encounter (Signed)
 Pt made aware of Reminder placed in epic. Pt was scheduled for an office visit on 05/30/2024 at 8:50 AM to see Dr. Charlanne.  Pt verbalized understanding with all questions answered.

## 2024-05-04 ENCOUNTER — Other Ambulatory Visit: Payer: Self-pay | Admitting: Family Medicine

## 2024-05-04 NOTE — Telephone Encounter (Signed)
 Requesting: lorazepam  0.5mg  Contract:01/20/23 UDS:12/09/23 Last Visit: 12/09/23 Next Visit:06/19/24 Last Refill: 09/10/22 #30 and 0RF   Please Advise

## 2024-05-30 ENCOUNTER — Ambulatory Visit: Admitting: Gastroenterology

## 2024-06-18 NOTE — Progress Notes (Unsigned)
 "  Subjective:    Patient ID: Beverly Pearson, female    DOB: 1990-09-24, 33 y.o.   MRN: [id number]  No chief complaint on file.   HPI Discussed the use of AI scribe software for clinical note transcription with the patient, who gave verbal consent to proceed.  History of Present Illness     Past Medical History:  Diagnosis Date   Anxiety 02/17/2011   Constipation    Depression with anxiety 03/03/2011   GERD (gastroesophageal reflux disease)    Herpes simplex without mention of complication    Hypertension    no meds currently   Insomnia 09/01/2011   Overweight 04/21/2015   Reflux 02/17/2011   Reflux 03/03/2011   Reflux 09/16/2011   Tobacco use 12/25/2016    Past Surgical History:  Procedure Laterality Date   TONSILLECTOMY AND ADENOIDECTOMY     age 43   WISDOM TOOTH EXTRACTION      Family History  Problem Relation Age of Onset   Diabetes Mother        type 2   Hypertension Mother    Diabetes Maternal Grandmother        type 2   Cancer Maternal Grandmother 21       ovarian   Hypertension Maternal Grandmother    Kidney failure Maternal Grandfather    Hypertension Maternal Grandfather    Kidney disease Maternal Grandfather    Diabetes Paternal Grandmother        type 2   Hypertension Paternal Grandmother    Emphysema Paternal Grandfather        smoke   COPD Paternal Grandfather    Cancer Paternal Grandfather 25       prostate cancer, metastatic   Colon cancer Neg Hx    Esophageal cancer Neg Hx    Rectal cancer Neg Hx    Stomach cancer Neg Hx     Social History   Socioeconomic History   Marital status: Single    Spouse name: Not on file   Number of children: 0   Years of education: Not on file   Highest education level: Associate degree: occupational, scientist, product/process development, or vocational program  Occupational History   Occupation: Unemployed  Tobacco Use   Smoking status: Former    Current packs/day: 0.00    Types:  Cigarettes    Quit date: 12/04/2020    Years since quitting: 3.5   Smokeless tobacco: Never  Vaping Use   Vaping status: Never Used  Substance and Sexual Activity   Alcohol use: Yes    Comment: socially   Drug use: Yes    Types: Marijuana    Comment: last used 11-29-23   Sexual activity: Never    Birth control/protection: Pill    Comment: on period 11-30-23  Other Topics Concern   Not on file  Social History Narrative   Occasional soda    Social Drivers of Health   Tobacco Use: Medium Risk (01/20/2024)   Patient History    Smoking Tobacco Use: Former    Smokeless Tobacco Use: Never    Passive Exposure: Not on file  Financial Resource Strain: High Risk (12/09/2023)   Overall Financial Resource Strain (CARDIA)    Difficulty of Paying Living Expenses: Hard  Food Insecurity: Food Insecurity Present (12/09/2023)   Epic    Worried About Programme Researcher, Broadcasting/film/video in the Last Year: Sometimes true    Ran Out of Food in the Last Year: Sometimes true  Transportation Needs: No Transportation Needs (12/09/2023)  Epic    Lack of Transportation (Medical): No    Lack of Transportation (Non-Medical): No  Physical Activity: Sufficiently Active (12/09/2023)   Exercise Vital Sign    Days of Exercise per Week: 4 days    Minutes of Exercise per Session: 120 min  Stress: No Stress Concern Present (12/09/2023)   [employer] of Occupational Health - Occupational Stress Questionnaire    Feeling of Stress: Only a little  Social Connections: Socially Isolated (12/09/2023)   Social Connection and Isolation Panel    Frequency of Communication with Friends and Family: Once a week    Frequency of Social Gatherings with Friends and Family: Once a week    Attends Religious Services: Never    Database Administrator or Organizations: No    Attends Engineer, Structural: Not on file    Marital Status: Never married  Intimate Partner Violence: Not on file  Depression (PHQ2-9):  Medium Risk (11/25/2023)   Depression (PHQ2-9)    PHQ-2 Score: 5  Alcohol Screen: Low Risk (12/09/2023)   Alcohol Screen    Last Alcohol Screening Score (AUDIT): 3  Housing: Low Risk (12/09/2023)   Epic    Unable to Pay for Housing in the Last Year: No    Number of Times Moved in the Last Year: 0    Homeless in the Last Year: No  Utilities: Low Risk (08/14/2022)   Received from [facility]   Utilities    In the past 12 months has the electric, gas, oil, or water company threatened to shut off services in your home? : No  Health Literacy: Not on file    Outpatient Medications Prior to Visit  Medication Sig Dispense Refill   LORazepam  (ATIVAN ) 0.5 MG tablet Take 1 tablet by mouth twice daily as needed for anxiety 30 tablet 0   meloxicam  (MOBIC ) 15 MG tablet Take 1 tablet (15 mg total) by mouth daily. 30 tablet 0   Multiple Vitamin (MULTIVITAMIN) tablet Take 1 tablet by mouth daily as needed.     pantoprazole  (PROTONIX ) 20 MG tablet Take 2 tablets (40 mg total) by mouth daily. 60 tablet 1   tranexamic acid  (LYSTEDA ) 650 MG TABS tablet Take 2 tablets (1,300 mg total) by mouth 3 (three) times daily. Take during menses for a maximum of five days (Patient not taking: Reported on 12/09/2023) 30 tablet 2   ursodiol  (URSO ) 250 MG tablet Take 2 tablets (500 mg total) by mouth 2 (two) times daily. 180 tablet 4   zolpidem  (AMBIEN ) 5 MG tablet Take 1 tablet (5 mg total) by mouth at bedtime as needed for sleep. 30 tablet 1   No facility-administered medications prior to visit.    Allergies[1]  Review of Systems  Constitutional:  Negative for fever and malaise/fatigue.  HENT:  Negative for congestion.   Eyes:  Negative for blurred vision.  Respiratory:  Negative for shortness of breath.   Cardiovascular:  Negative for chest pain, palpitations and leg swelling.  Gastrointestinal:  Negative for abdominal pain, blood in stool and nausea.  Genitourinary:  Negative for dysuria and  frequency.  Musculoskeletal:  Negative for falls.  Skin:  Negative for rash.  Neurological:  Negative for dizziness, loss of consciousness and headaches.  Endo/Heme/Allergies:  Negative for environmental allergies.  Psychiatric/Behavioral:  Negative for depression. The patient is not nervous/anxious.        Objective:    Physical Exam Constitutional:      General: She is not in acute distress.  Appearance: Normal appearance. She is well-developed. She is not toxic-appearing.  HENT:     Head: Normocephalic and atraumatic.     Right Ear: External ear normal.     Left Ear: External ear normal.     Nose: Nose normal.  Eyes:     General:        Right eye: No discharge.        Left eye: No discharge.     Conjunctiva/sclera: Conjunctivae normal.  Neck:     Thyroid : No thyromegaly.  Cardiovascular:     Rate and Rhythm: Normal rate and regular rhythm.     Heart sounds: Normal heart sounds. No murmur heard. Pulmonary:     Effort: Pulmonary effort is normal. No respiratory distress.     Breath sounds: Normal breath sounds.  Abdominal:     General: Bowel sounds are normal.     Palpations: Abdomen is soft.     Tenderness: There is no abdominal tenderness. There is no guarding.  Musculoskeletal:        General: Normal range of motion.     Cervical back: Neck supple.  Lymphadenopathy:     Cervical: No cervical adenopathy.  Skin:    General: Skin is warm and dry.  Neurological:     Mental Status: She is alert and oriented to person, place, and time.  Psychiatric:        Mood and Affect: Mood normal.        Behavior: Behavior normal.        Thought Content: Thought content normal.        Judgment: Judgment normal.    There were no vitals taken for this visit. Wt Readings from Last 3 Encounters:  12/09/23 176 lb 9.6 oz (80.1 kg)  12/01/23 180 lb (81.6 kg)  11/30/23 173 lb (78.5 kg)    Diabetic Foot Exam - Simple   No data filed    Lab Results  Component Value Date    WBC 5.1 12/09/2023   HGB 13.8 12/09/2023   HCT 42.2 12/09/2023   PLT 264.0 12/09/2023   GLUCOSE 96 10/13/2023   CHOL 190 07/26/2023   TRIG 36.0 07/26/2023   HDL 98.70 07/26/2023   LDLCALC 85 07/26/2023   ALT 30 10/13/2023   AST 23 10/13/2023   NA 136 10/13/2023   K 4.1 10/13/2023   CL 100 10/13/2023   CREATININE 0.79 10/13/2023   BUN 15 10/13/2023   CO2 31 10/13/2023   TSH 1.07 12/09/2023   HGBA1C 5.7 07/26/2023    Lab Results  Component Value Date   TSH 1.07 12/09/2023   Lab Results  Component Value Date   WBC 5.1 12/09/2023   HGB 13.8 12/09/2023   HCT 42.2 12/09/2023   MCV 86.6 12/09/2023   PLT 264.0 12/09/2023   Lab Results  Component Value Date   NA 136 10/13/2023   K 4.1 10/13/2023   CO2 31 10/13/2023   GLUCOSE 96 10/13/2023   BUN 15 10/13/2023   CREATININE 0.79 10/13/2023   BILITOT 0.4 10/13/2023   ALKPHOS 103 10/13/2023   AST 23 10/13/2023   ALT 30 10/13/2023   PROT 7.4 10/13/2023   ALBUMIN 4.1 10/13/2023   CALCIUM 8.9 10/13/2023   GFR 99.31 10/13/2023   Lab Results  Component Value Date   CHOL 190 07/26/2023   Lab Results  Component Value Date   HDL 98.70 07/26/2023   Lab Results  Component Value Date   LDLCALC 85 07/26/2023   Lab  Results  Component Value Date   TRIG 36.0 07/26/2023   Lab Results  Component Value Date   CHOLHDL 2 07/26/2023   Lab Results  Component Value Date   HGBA1C 5.7 07/26/2023       Assessment & Plan:  Gastroesophageal reflux disease without esophagitis Assessment & Plan: Avoid offending foods, start probiotics. Do not eat large meals in late evening and consider raising head of bed.     Primary hypertension Assessment & Plan: Well controlled, no changes to meds. Encouraged heart healthy diet such as the DASH diet and exercise as tolerated.       Assessment and Plan Assessment & Plan      Harlene Horton, MD     [1] Allergies Allergen Reactions   Other Other (See Comments)    HOUSE  DUST, sneezing and runny nose  "

## 2024-06-18 NOTE — Assessment & Plan Note (Signed)
Avoid offending foods, start probiotics. Do not eat large meals in late evening and consider raising head of bed.  

## 2024-06-18 NOTE — Assessment & Plan Note (Signed)
 Well controlled, no changes to meds. Encouraged heart healthy diet such as the DASH diet and exercise as tolerated.

## 2024-06-19 ENCOUNTER — Ambulatory Visit: Admitting: Family Medicine

## 2024-06-19 VITALS — BP 126/84 | HR 70 | Temp 97.8°F | Resp 16 | Ht 66.0 in | Wt 191.0 lb

## 2024-06-19 DIAGNOSIS — S46011A Strain of muscle(s) and tendon(s) of the rotator cuff of right shoulder, initial encounter: Secondary | ICD-10-CM

## 2024-06-19 DIAGNOSIS — M25511 Pain in right shoulder: Secondary | ICD-10-CM

## 2024-06-19 DIAGNOSIS — N946 Dysmenorrhea, unspecified: Secondary | ICD-10-CM

## 2024-06-19 DIAGNOSIS — D219 Benign neoplasm of connective and other soft tissue, unspecified: Secondary | ICD-10-CM | POA: Diagnosis not present

## 2024-06-19 DIAGNOSIS — R0600 Dyspnea, unspecified: Secondary | ICD-10-CM

## 2024-06-19 DIAGNOSIS — K219 Gastro-esophageal reflux disease without esophagitis: Secondary | ICD-10-CM | POA: Diagnosis not present

## 2024-06-19 DIAGNOSIS — N921 Excessive and frequent menstruation with irregular cycle: Secondary | ICD-10-CM | POA: Diagnosis not present

## 2024-06-19 DIAGNOSIS — K743 Primary biliary cirrhosis: Secondary | ICD-10-CM

## 2024-06-19 DIAGNOSIS — I10 Essential (primary) hypertension: Secondary | ICD-10-CM | POA: Diagnosis not present

## 2024-06-19 DIAGNOSIS — G8929 Other chronic pain: Secondary | ICD-10-CM | POA: Diagnosis not present

## 2024-06-19 MED ORDER — DICLOFENAC POTASSIUM 50 MG PO TABS: ORAL_TABLET | ORAL | 0 refills | Status: DC

## 2024-06-20 ENCOUNTER — Encounter: Payer: Self-pay | Admitting: Family Medicine

## 2024-06-20 DIAGNOSIS — K743 Primary biliary cirrhosis: Secondary | ICD-10-CM | POA: Insufficient documentation

## 2024-06-20 NOTE — Assessment & Plan Note (Signed)
 She follows with hepatology. No hormonal products can be used.

## 2024-07-12 ENCOUNTER — Encounter: Payer: Self-pay | Admitting: Family Medicine

## 2024-07-12 ENCOUNTER — Other Ambulatory Visit: Payer: Self-pay | Admitting: Family Medicine

## 2024-07-12 MED ORDER — DICLOFENAC POTASSIUM 50 MG PO TABS: ORAL_TABLET | ORAL | 2 refills | Status: AC

## 2024-07-17 ENCOUNTER — Ambulatory Visit: Admitting: Gastroenterology

## 2024-07-25 ENCOUNTER — Ambulatory Visit (HOSPITAL_BASED_OUTPATIENT_CLINIC_OR_DEPARTMENT_OTHER)

## 2024-08-03 ENCOUNTER — Encounter: Payer: Medicaid Other | Admitting: Family Medicine

## 2024-08-23 ENCOUNTER — Ambulatory Visit: Admitting: Gastroenterology
